# Patient Record
Sex: Female | Born: 1985 | Race: Black or African American | Hispanic: No | Marital: Single | Smoking: Former smoker
Health system: Southern US, Community
[De-identification: ages and names within clinical notes are randomized; demographics above are authoritative.]

## PROBLEM LIST (undated history)

## (undated) ENCOUNTER — Inpatient Hospital Stay (HOSPITAL_COMMUNITY): Payer: Self-pay

## (undated) DIAGNOSIS — B999 Unspecified infectious disease: Secondary | ICD-10-CM

## (undated) DIAGNOSIS — F329 Major depressive disorder, single episode, unspecified: Secondary | ICD-10-CM

## (undated) DIAGNOSIS — Z975 Presence of (intrauterine) contraceptive device: Secondary | ICD-10-CM

## (undated) DIAGNOSIS — Z8659 Personal history of other mental and behavioral disorders: Secondary | ICD-10-CM

## (undated) DIAGNOSIS — F32A Depression, unspecified: Secondary | ICD-10-CM

## (undated) DIAGNOSIS — Z8719 Personal history of other diseases of the digestive system: Secondary | ICD-10-CM

## (undated) DIAGNOSIS — Z8619 Personal history of other infectious and parasitic diseases: Secondary | ICD-10-CM

## (undated) DIAGNOSIS — D649 Anemia, unspecified: Secondary | ICD-10-CM

## (undated) DIAGNOSIS — Z87898 Personal history of other specified conditions: Secondary | ICD-10-CM

## (undated) HISTORY — DX: Major depressive disorder, single episode, unspecified: F32.9

## (undated) HISTORY — DX: Presence of (intrauterine) contraceptive device: Z97.5

## (undated) HISTORY — DX: Anemia, unspecified: D64.9

## (undated) HISTORY — DX: Personal history of other diseases of the digestive system: Z87.19

## (undated) HISTORY — DX: Personal history of other infectious and parasitic diseases: Z86.19

## (undated) HISTORY — DX: Personal history of other mental and behavioral disorders: Z86.59

## (undated) HISTORY — PX: TONSILLECTOMY: SUR1361

## (undated) HISTORY — PX: FOOT SURGERY: SHX648

## (undated) HISTORY — DX: Personal history of other specified conditions: Z87.898

## (undated) HISTORY — DX: Depression, unspecified: F32.A

---

## 2011-06-27 ENCOUNTER — Inpatient Hospital Stay (HOSPITAL_COMMUNITY): Admission: AD | Admit: 2011-06-27 | Payer: Self-pay | Source: Ambulatory Visit | Admitting: Obstetrics and Gynecology

## 2012-01-13 LAB — OB RESULTS CONSOLE ANTIBODY SCREEN: Antibody Screen: NEGATIVE

## 2012-01-13 LAB — OB RESULTS CONSOLE HEPATITIS B SURFACE ANTIGEN: Hepatitis B Surface Ag: NEGATIVE

## 2012-01-13 LAB — OB RESULTS CONSOLE ABO/RH: RH Type: POSITIVE

## 2012-01-13 LAB — OB RESULTS CONSOLE GC/CHLAMYDIA: Gonorrhea: NEGATIVE

## 2012-03-16 ENCOUNTER — Inpatient Hospital Stay (HOSPITAL_COMMUNITY)
Admission: AD | Admit: 2012-03-16 | Discharge: 2012-03-16 | Disposition: A | Discharge status: Home / Self Care | Payer: BC Managed Care – PPO | Source: Ambulatory Visit | Attending: Obstetrics and Gynecology | Admitting: Obstetrics and Gynecology

## 2012-03-16 ENCOUNTER — Encounter (HOSPITAL_COMMUNITY): Payer: Self-pay

## 2012-03-16 DIAGNOSIS — O2390 Unspecified genitourinary tract infection in pregnancy, unspecified trimester: Secondary | ICD-10-CM

## 2012-03-16 DIAGNOSIS — N390 Urinary tract infection, site not specified: Secondary | ICD-10-CM | POA: Insufficient documentation

## 2012-03-16 DIAGNOSIS — O2340 Unspecified infection of urinary tract in pregnancy, unspecified trimester: Secondary | ICD-10-CM

## 2012-03-16 DIAGNOSIS — R109 Unspecified abdominal pain: Secondary | ICD-10-CM | POA: Insufficient documentation

## 2012-03-16 LAB — URINE MICROSCOPIC-ADD ON

## 2012-03-16 LAB — URINALYSIS, ROUTINE W REFLEX MICROSCOPIC
Bilirubin Urine: NEGATIVE
Glucose, UA: NEGATIVE mg/dL
Protein, ur: NEGATIVE mg/dL
Urobilinogen, UA: 0.2 mg/dL (ref 0.0–1.0)

## 2012-03-16 MED ORDER — NITROFURANTOIN MONOHYD MACRO 100 MG PO CAPS: 100.0000 mg | ORAL_CAPSULE | Freq: Two times a day (BID) | ORAL | Status: DC

## 2012-03-16 NOTE — MAU Provider Note (Signed)
History     CSN: [id number]  Arrival date and time: 03/16/12 1440   None     Chief Complaint  Patient presents with  . Abdominal Pain   HPILouise Gibbs is 26 y.o. [id number] [redacted]w[redacted]d weeks presenting with soreness and abdominal dull ache on the right.  Feels is more with standing and pushing carts at work.  Patient of Dr. Vance Gather.  Denies vaginal bleeding, discharge, fever,chills.nausea and vomiting.  Last intercourse 2 days ago.     Past Medical History  Diagnosis Date  . No pertinent past medical history     No past surgical history on file.  No family history on file.  History  Substance Use Topics  . Smoking status: Former Games developer  . Smokeless tobacco: Not on file  . Alcohol Use: No    Allergies: No Known Allergies  Prescriptions prior to admission  Medication Sig Dispense Refill  . Prenatal Vit-Fe Fumarate-FA (PRENATAL MULTIVITAMIN) TABS Take 1 tablet by mouth daily.        Review of Systems  Constitutional: Negative.   Gastrointestinal: Positive for abdominal pain. Negative for nausea and vomiting.  Genitourinary:       Lower lateral pain >right   Physical Exam   Blood pressure 109/57, pulse 91, temperature 98.3 F (36.8 C), temperature source Oral, resp. rate 18, height 5\' 3"  (1.6 m), weight 100.245 kg (221 lb).  Physical Exam  Constitutional: She is oriented to person, place, and time. She appears well-developed and well-nourished. No distress.  HENT:  Head: Normocephalic.  GI: Soft. There is no tenderness.  Genitourinary:       Cervical exam by Elnita Maxwell, RN --Cervix is closed, long and thick  Neurological: She is alert and oriented to person, place, and time.  Skin: Skin is warm and dry.  Psychiatric: She has a normal mood and affect. Her behavior is normal.   Results for orders placed during the hospital encounter of 03/16/12 (from the past 24 hour(s))  URINALYSIS, ROUTINE W REFLEX MICROSCOPIC     Status: Abnormal   Collection Time   03/16/12  2:45 PM       Component Value Range   Color, Urine YELLOW  YELLOW   APPearance CLOUDY (*) CLEAR   Specific Gravity, Urine >1.030 (*) 1.005 - 1.030   pH 6.0  5.0 - 8.0   Glucose, UA NEGATIVE  NEGATIVE mg/dL   Hgb urine dipstick SMALL (*) NEGATIVE   Bilirubin Urine NEGATIVE  NEGATIVE   Ketones, ur 40 (*) NEGATIVE mg/dL   Protein, ur NEGATIVE  NEGATIVE mg/dL   Urobilinogen, UA 0.2  0.0 - 1.0 mg/dL   Nitrite NEGATIVE  NEGATIVE   Leukocytes, UA SMALL (*) NEGATIVE  URINE MICROSCOPIC-ADD ON     Status: Abnormal   Collection Time   03/16/12  2:45 PM      Component Value Range   Squamous Epithelial / LPF MANY (*) RARE   WBC, UA 3-6  <3 WBC/hpf   RBC / HPF 0-2  <3 RBC/hpf   Bacteria, UA MANY (*) RARE   MAU Course  Procedures  Urine culture pending  MDM 15:30  Reported MSE to Dr. Henderson Cloud.  Order given for Urine culture, check cervix.   Treat with Macrobid 500mg  bid X 5 days and push po fluids.  Keep scheduled appt/call if sxs worsen.  Assessment and Plan  A;  Urinary Tract infection at [redacted]w[redacted]d gestation     Possible Round ligament pain  P Rx for Macrobid 500mg  bid X  5 days     Instructed to push po fluids, call office if sxs worsen    Keep scheduled appointment.    Tylenol prn for discomfort  KEY,EVE M 03/16/2012, 3:08 PM

## 2012-03-16 NOTE — MAU Note (Signed)
Patient presents with complaint of dull pain on left side of abdomen, with cramping in lower abdomen.

## 2012-05-10 ENCOUNTER — Inpatient Hospital Stay (HOSPITAL_COMMUNITY)
Admission: AD | Admit: 2012-05-10 | Discharge: 2012-05-10 | Disposition: A | Discharge status: Home / Self Care | Payer: BC Managed Care – PPO | Source: Ambulatory Visit | Attending: Obstetrics and Gynecology | Admitting: Obstetrics and Gynecology

## 2012-05-10 ENCOUNTER — Encounter (HOSPITAL_COMMUNITY): Payer: Self-pay | Admitting: *Deleted

## 2012-05-10 DIAGNOSIS — O99891 Other specified diseases and conditions complicating pregnancy: Secondary | ICD-10-CM | POA: Insufficient documentation

## 2012-05-10 DIAGNOSIS — A088 Other specified intestinal infections: Secondary | ICD-10-CM

## 2012-05-10 DIAGNOSIS — R197 Diarrhea, unspecified: Secondary | ICD-10-CM | POA: Insufficient documentation

## 2012-05-10 DIAGNOSIS — R109 Unspecified abdominal pain: Secondary | ICD-10-CM | POA: Insufficient documentation

## 2012-05-10 DIAGNOSIS — O212 Late vomiting of pregnancy: Secondary | ICD-10-CM | POA: Insufficient documentation

## 2012-05-10 DIAGNOSIS — A084 Viral intestinal infection, unspecified: Secondary | ICD-10-CM

## 2012-05-10 LAB — URINALYSIS, ROUTINE W REFLEX MICROSCOPIC
Ketones, ur: 15 mg/dL — AB
Leukocytes, UA: NEGATIVE
Nitrite: NEGATIVE
Protein, ur: NEGATIVE mg/dL
pH: 6 (ref 5.0–8.0)

## 2012-05-10 LAB — URINE MICROSCOPIC-ADD ON

## 2012-05-10 MED ORDER — ONDANSETRON 4 MG PO TBDP: 4.0000 mg | ORAL_TABLET | Freq: Three times a day (TID) | ORAL | Status: DC | PRN

## 2012-05-10 MED ORDER — LACTATED RINGERS IV BOLUS (SEPSIS)
1000.0000 mL | Freq: Once | INTRAVENOUS | Status: AC
  Administered 2012-05-10: 1000 mL via INTRAVENOUS

## 2012-05-10 MED ORDER — ONDANSETRON 8 MG/NS 50 ML IVPB
8.0000 mg | Freq: Once | INTRAVENOUS | Status: AC
  Administered 2012-05-10: 8 mg via INTRAVENOUS
  Filled 2012-05-10: qty 8

## 2012-05-10 NOTE — MAU Note (Signed)
Pt's son has been sick, pt started vomitting yesterday, severe abd pain started @ 0430, vomitting has continued, now also having diarrhea.  Denies vag bleeding.

## 2012-05-10 NOTE — MAU Provider Note (Signed)
  History     CSN: [id number]  Arrival date and time: 05/10/12 1508   First Provider Initiated Contact with Patient 05/10/12 1630      Chief Complaint  Patient presents with  . Emesis  . Diarrhea  . Abdominal Pain   HPI  Mary Gibbs is a 26 y.o. [id number] who presents today with nausea/vomiting. She states her son had a 24-48 hour "bug" a few days ago, and she started with nausea and vomiting yesterday morning, and diarrhea today as well. She states she has felt cramping as well.   Past Medical History  Diagnosis Date  . No pertinent past medical history     History reviewed. No pertinent past surgical history.  History reviewed. No pertinent family history.  History  Substance Use Topics  . Smoking status: Former Games developer  . Smokeless tobacco: Not on file  . Alcohol Use: No    Allergies: No Known Allergies  Prescriptions prior to admission  Medication Sig Dispense Refill  . nitrofurantoin, macrocrystal-monohydrate, (MACROBID) 100 MG capsule Take 1 capsule (100 mg total) by mouth 2 (two) times daily.  10 capsule  0  . Prenatal Vit-Fe Fumarate-FA (PRENATAL MULTIVITAMIN) TABS Take 1 tablet by mouth daily.        Review of Systems  Constitutional: Negative for fever and chills.  Eyes: Negative for blurred vision.  Gastrointestinal: Positive for nausea, vomiting, abdominal pain and diarrhea. Negative for constipation.  Genitourinary: Negative for dysuria, urgency and frequency.  Neurological: Negative for dizziness and headaches.   Physical Exam   Blood pressure 102/57, pulse 93, temperature 99 F (37.2 C), temperature source Oral, resp. rate 18, height 5\' 4"  (1.626 m), weight 101.878 kg (224 lb 9.6 oz).  Physical Exam  Nursing note and vitals reviewed. Constitutional: She is oriented to person, place, and time. She appears well-developed and well-nourished. No distress.  Cardiovascular: Normal rate.   Respiratory: Effort normal.  GI: Soft. She exhibits no  distension. There is no tenderness. There is no rebound.  Neurological: She is alert and oriented to person, place, and time.  Skin: Skin is warm and dry.  Psychiatric: She has a normal mood and affect.   FHT: 15 mins of Cat I tracing Toco: no UC's MAU Course  Procedures  1705: Spoke with Dr. Marcelle Overlie. Pt is OK for DC home. It is ok that we cannot obtain a full 20 min NST. Provide pt with rx for zofran  Assessment and Plan   1. Viral gastroenteritis     Naziyah, Tieszen  Home Medication Instructions JYN:[id number]   Printed on:05/10/12 1710  Medication Information                    Prenatal Vit-Fe Fumarate-FA (PRENATAL MULTIVITAMIN) TABS Take 1 tablet by mouth daily.           nitrofurantoin, macrocrystal-monohydrate, (MACROBID) 100 MG capsule Take 1 capsule (100 mg total) by mouth 2 (two) times daily.           ondansetron (ZOFRAN ODT) 4 MG disintegrating tablet Take 1 tablet (4 mg total) by mouth every 8 (eight) hours as needed for nausea.            Tawnya Crook 05/10/2012, 4:30 PM

## 2012-05-12 LAB — URINE CULTURE

## 2012-05-24 ENCOUNTER — Ambulatory Visit (INDEPENDENT_AMBULATORY_CARE_PROVIDER_SITE_OTHER): Payer: Medicaid Other | Admitting: Obstetrics and Gynecology

## 2012-05-24 DIAGNOSIS — Z331 Pregnant state, incidental: Secondary | ICD-10-CM

## 2012-05-24 NOTE — Progress Notes (Signed)
NOB interview completed.  Pt transferred care from [facility], but has not been seen there since 03/2012.  Pt completed ROI form for records.  No complaints, doing well.  NOB work up scheduled for Monday 06/14/2012 w/ AVS.

## 2012-06-02 NOTE — L&D Delivery Note (Signed)
Delivery Note At 7:40 AM a viable female, "Mary Gibbs", was delivered via Vaginal, Spontaneous Delivery (Presentation: ; Occiput Posterior).  APGAR: 9, 9; weight .   Placenta status: Intact, partial manual removal.  Placenta was anterior, delivered partially with gentle traction at 30 min after delivery--approx 1/2 of placenta had extruded out the introitus.  The rest was assisted to deliver by gentle twisting and single manual exploration just inside the cervix to facilitate complete expulsion of the rest of the placenta. Bleeding WNL, but cytotech 1000 mcg placed in rectum after placental delivery for prophylaxis.  Uterus well-contracted after placental delivery, no residual fragments noted on gentle intrauterine exploration.   Cord: 3 vessels with the following complications: Cord around body.  Cord pH: NA  Anesthesia: Epidural  Episiotomy: None Lacerations: 2nd degree;Perineal Suture Repair: 3.0 monocryl Est. Blood Loss (mL): 300  Mom to postpartum.  Baby to skin to skin.. Methergine po course will be started on MBU.  Nigel Bridgeman 08/26/2012, 9:11 AM

## 2012-06-14 ENCOUNTER — Ambulatory Visit (INDEPENDENT_AMBULATORY_CARE_PROVIDER_SITE_OTHER): Payer: Medicaid Other | Admitting: Obstetrics and Gynecology

## 2012-06-14 ENCOUNTER — Encounter: Payer: Self-pay | Admitting: Obstetrics and Gynecology

## 2012-06-14 VITALS — BP 110/64 | Wt 232.0 lb

## 2012-06-14 DIAGNOSIS — Z331 Pregnant state, incidental: Secondary | ICD-10-CM

## 2012-06-14 DIAGNOSIS — O4410 Placenta previa with hemorrhage, unspecified trimester: Secondary | ICD-10-CM

## 2012-06-14 DIAGNOSIS — Z3490 Encounter for supervision of normal pregnancy, unspecified, unspecified trimester: Secondary | ICD-10-CM | POA: Insufficient documentation

## 2012-06-14 DIAGNOSIS — E669 Obesity, unspecified: Secondary | ICD-10-CM | POA: Insufficient documentation

## 2012-06-14 DIAGNOSIS — O4400 Placenta previa specified as without hemorrhage, unspecified trimester: Secondary | ICD-10-CM | POA: Insufficient documentation

## 2012-06-14 DIAGNOSIS — R319 Hematuria, unspecified: Secondary | ICD-10-CM

## 2012-06-14 LAB — POCT URINALYSIS DIPSTICK
Nitrite, UA: NEGATIVE
Urobilinogen, UA: NEGATIVE
pH, UA: 5

## 2012-06-14 NOTE — Progress Notes (Signed)
[redacted]w[redacted]d LMP 11/15/2011 Last Pap Smear 07/2011

## 2012-06-14 NOTE — Progress Notes (Signed)
[facility] NEW OB EXAMINATION   Mary Gibbs is a 27 y.o. female, G3P1011, who presents at [redacted]w[redacted]d gestation for a new obstetrical examination. The patient was followed by another office here in [place] for the beginning of her pregnancy.  She states that her last menstrual period was normal.  An ultrasound was performed at 18 weeks that confirmed the due date of August 21, 2012.  The patient has not been seen in the other office, however, is October 2013.  She says that she was told that she had a placenta previa.  She denies bleeding.  The following portions of the patient's history were reviewed and updated as appropriate: allergies, current medications, past family history, past medical history, past social history, past surgical history and problem list.  OB History    Grav Para Term Preterm Abortions TAB SAB Ect Mult Living   3 1 1  1 1    1       Past Medical History  Diagnosis Date  . No pertinent past medical history   . Depression     Zoloft in the past  . Anemia     @ last [facility] appt    Past Surgical History  Procedure Date  . Tonsillectomy     As a baby    Family History  Problem Relation Age of Onset  . Epilepsy Sister   . Colon cancer Paternal Grandfather   . Breast cancer Maternal Aunt     x 3;after menopause  . Hypertension Mother   . Tuberculosis Mother     as a child  . Diabetes Maternal Grandmother   . Diabetes Mother     diet controlled  . Depression Mother   . Bipolar disorder Sister     Social History:  reports that she quit smoking about 7 months ago. Her smoking use included Cigarettes. She smoked .3 packs per day. She has never used smokeless tobacco. She reports that she drinks alcohol. She reports that she does not use illicit drugs.  Allergies: No Known Allergies  Medications: prenatal vitamins   Objective:    BP 110/64  Wt 232 lb (105.235 kg)    Weight:  Wt Readings from Last 1 Encounters:  06/14/12 232 lb (105.235 kg)          BMI:  There is no height on file to calculate BMI.  General Appearance: Alert, appropriate appearance for age. No acute distress HEENT: Grossly normal Neck / Thyroid: Supple, no masses, nodes or enlargement Lungs: clear to auscultation bilaterally Back: No CVA tenderness Breast Exam: previously performed Cardiovascular: Regular rate and rhythm. S1, S2, no murmur Gastrointestinal: Soft, non-tender, no masses or organomegaly.                               Fundal height: 30 weeks weeks                               Fetal heart tones audible: yes  ++++++++++++++++++++++++++++++++++++++++++++++++++++++++  Pelvic Exam: External genitalia: normal general appearance Vaginal: normal without tenderness, induration or masses and relaxation: No Cervix: and uterus exam: Deferred until we can confirm there is not a placenta previa  ++++++++++++++++++++++++++++++++++++++++++++++++++++++++  Lymphatic Exam: Non-palpable nodes in neck, clavicular, axillary, or inguinal regions Neurologic: Normal speech, no tremor  Psychiatric: Alert and oriented, appropriate affect.  Prenatal labs: ABO, Rh:   Antibody:   Rubella:  RPR:    HBsAg:    HIV:    GBS:      Assessment:   27 y.o. female G3P1011 at [redacted]w[redacted]d gestation ( EDC is August 21, 2012) by: Normal Last menstrual period: yes Ultrasound:                               confirms                                Inconsistent prenatal care  Obesity  Placenta previa  Past history of depression   Plan:    Ultrasound to evaluate fetus and placenta.  Glucola next visit.  We discussed routine pregnancy issues:  Toxoplasmosis was reviewed.  The patient was told to avoid cat liter boxes and feces.  The patient was told to avoid predator fish including tuna because of our concerns for mercury consumption.  The patient was told to avoid soft cheeses.  The patient was told to be sure that all lunch meats are well cooked.  Genetic screening was  discussed. 2 late  Our model for pregnancy management was reviewed.  Proper diet and exercise reviewed.  Return to office in 2 weeks.  Medications include:  Prenatal vitamins  Mylinda Latina.D.

## 2012-06-23 ENCOUNTER — Ambulatory Visit: Payer: Medicaid Other | Admitting: Obstetrics and Gynecology

## 2012-06-23 ENCOUNTER — Encounter: Payer: Self-pay | Admitting: Obstetrics and Gynecology

## 2012-06-23 ENCOUNTER — Ambulatory Visit: Payer: Medicaid Other

## 2012-06-23 ENCOUNTER — Other Ambulatory Visit: Payer: Self-pay

## 2012-06-23 VITALS — BP 102/62 | Wt 230.0 lb

## 2012-06-23 DIAGNOSIS — O4400 Placenta previa specified as without hemorrhage, unspecified trimester: Secondary | ICD-10-CM

## 2012-06-23 DIAGNOSIS — IMO0002 Reserved for concepts with insufficient information to code with codable children: Secondary | ICD-10-CM

## 2012-06-23 DIAGNOSIS — Z3689 Encounter for other specified antenatal screening: Secondary | ICD-10-CM

## 2012-06-23 DIAGNOSIS — Z1389 Encounter for screening for other disorder: Secondary | ICD-10-CM

## 2012-06-23 DIAGNOSIS — Z331 Pregnant state, incidental: Secondary | ICD-10-CM

## 2012-06-23 LAB — CBC
HCT: 31.4 % — ABNORMAL LOW (ref 36.0–46.0)
MCHC: 34.1 g/dL (ref 30.0–36.0)
Platelets: 286 10*3/uL (ref 150–400)
RDW: 13.9 % (ref 11.5–15.5)

## 2012-06-23 NOTE — Progress Notes (Signed)
[redacted]w[redacted]d GTT  Pt has bilateral hip pain x 1 week now

## 2012-06-23 NOTE — Progress Notes (Signed)
Ultrasound shows:  SIUP  S=D     [place] EDD: *08/24/2012           EFW: 3 lbs 11 oz 51%           AFI: 15.8           Cervical length: 2.45 cm           Placenta localization: right lateral           Fetal presentation: vertex U/S Comments: AC measurement is 1 week less than GA.  Suggest f/u in 2-3 weeks to assess for developing IUGR No late abnormality is seen. This study is technically limited due to fetal position and advanced GA. Female Gender.  CX closed. Normal Adnexa Pt declines flu vaccine

## 2012-06-23 NOTE — Progress Notes (Signed)
[redacted]w[redacted]d  GFM Previa resolved. AGA with mild AC lag. Normal AFI. Repeat ultrasound at [place] to evaluate [facility] growth Glucola today. Blood type: A +

## 2012-06-23 NOTE — Addendum Note (Signed)
Addended by: Darien Ramus on: 06/23/2012 05:31 PM   Modules accepted: Orders

## 2012-06-23 NOTE — Addendum Note (Signed)
Addended by: Darien Ramus on: 06/23/2012 05:30 PM   Modules accepted: Orders

## 2012-06-24 LAB — RPR

## 2012-06-24 LAB — US OB COMP + 14 WK

## 2012-06-24 LAB — GLUCOSE TOLERANCE, 1 HOUR (50G) W/O FASTING: Glucose, 1 Hour GTT: 127 mg/dL (ref 70–140)

## 2012-07-05 ENCOUNTER — Inpatient Hospital Stay (HOSPITAL_COMMUNITY)
Admission: AD | Admit: 2012-07-05 | Discharge: 2012-07-05 | Disposition: A | Discharge status: Home / Self Care | Payer: Medicaid Other | Source: Ambulatory Visit | Attending: Obstetrics and Gynecology | Admitting: Obstetrics and Gynecology

## 2012-07-05 ENCOUNTER — Encounter (HOSPITAL_COMMUNITY): Payer: Self-pay | Admitting: *Deleted

## 2012-07-05 DIAGNOSIS — O4700 False labor before 37 completed weeks of gestation, unspecified trimester: Secondary | ICD-10-CM | POA: Insufficient documentation

## 2012-07-05 LAB — URINE MICROSCOPIC-ADD ON

## 2012-07-05 LAB — URINALYSIS, ROUTINE W REFLEX MICROSCOPIC
Protein, ur: 30 mg/dL — AB
Urobilinogen, UA: 0.2 mg/dL (ref 0.0–1.0)

## 2012-07-05 LAB — FETAL FIBRONECTIN: Fetal Fibronectin: NEGATIVE

## 2012-07-05 NOTE — MAU Note (Signed)
Mary Gibbs is a 27 y.o. female presenting for contractions.  Reports contractions every minute and painful that suddenly started at 10:30 this am. Mainly felt mid and upper abdomen. Lasted 15-20 minutes. Came directly to MAU unannounced.Denies vaginal bleeding and LOF. Reports normal BM ( this am) and no urinary symptoms. Reports GFM. No history of PTL with first pregnancy.  OB History    Grav Para Term Preterm Abortions TAB SAB Ect Mult Living   3 1 1  1 1    1      Past Medical History  Diagnosis Date  . No pertinent past medical history   . Depression     Zoloft in the past  . Anemia     @ last [facility] appt   Past Surgical History  Procedure Date  . Tonsillectomy     As a baby      Blood pressure 104/64, pulse 95, temperature 98.4 F (36.9 C), temperature source Oral, height 5\' 3"  (1.6 m), weight 234 lb 3.2 oz (106.232 kg), SpO2 99.00%.  General Appearance: Alert, appropriate appearance for age. No acute distress Psychiatric Exam: Alert and oriented, appropriate affect  Fetal monitoring: Baseline: 13-140 bpm, Variability: Good {> 6 bpm), Accelerations: Reactive, Decelerations: Absent and reviewed and reassuring          No contractions since here.  ++++++++++++++++++++++++++++++++++++++++++++++++++++++++++++++++  Vaginal exam: Cervix is long and closed. Vertex -2 FFN: negative U/A: 15 ketones  ++++++++++++++++++++++++++++++++++++++++++++++++++++++++++++++++   Assessment and plan:  SIUP at 33+2 weeks without evidence of PTL D/C home and encourage to push po fluids Keep appointment in office 07/07/12  Silverio Lay MD

## 2012-07-05 NOTE — MAU Note (Signed)
Patient states she had been having contractions every minute but they stopped on the way to the hospital. Denies leaking or bleeding. Reports feeling less movement than usual.

## 2012-07-06 LAB — URINE CULTURE

## 2012-07-07 ENCOUNTER — Encounter: Payer: Self-pay | Admitting: Obstetrics and Gynecology

## 2012-07-07 ENCOUNTER — Ambulatory Visit: Payer: Medicaid Other

## 2012-07-07 ENCOUNTER — Ambulatory Visit: Payer: Medicaid Other | Admitting: Obstetrics and Gynecology

## 2012-07-07 VITALS — BP 110/60 | Wt 232.0 lb

## 2012-07-07 DIAGNOSIS — IMO0002 Reserved for concepts with insufficient information to code with codable children: Secondary | ICD-10-CM

## 2012-07-07 DIAGNOSIS — Z331 Pregnant state, incidental: Secondary | ICD-10-CM

## 2012-07-07 NOTE — Progress Notes (Signed)
[redacted]w[redacted]d [place] s=d cx 2.83 cm AFI 14 EFW 4-14 42% Results for orders placed during the hospital encounter of 07/05/12  URINALYSIS, ROUTINE W REFLEX MICROSCOPIC      Component Value Range   Color, Urine YELLOW  YELLOW   APPearance CLEAR  CLEAR   Specific Gravity, Urine 1.025  1.005 - 1.030   pH 7.5  5.0 - 8.0   Glucose, UA NEGATIVE  NEGATIVE mg/dL   Hgb urine dipstick TRACE (*) NEGATIVE   Bilirubin Urine NEGATIVE  NEGATIVE   Ketones, ur 15 (*) NEGATIVE mg/dL   Protein, ur 30 (*) NEGATIVE mg/dL   Urobilinogen, UA 0.2  0.0 - 1.0 mg/dL   Nitrite NEGATIVE  NEGATIVE   Leukocytes, UA TRACE (*) NEGATIVE  FETAL FIBRONECTIN      Component Value Range   Fetal Fibronectin NEGATIVE  NEGATIVE  URINE MICROSCOPIC-ADD ON      Component Value Range   Squamous Epithelial / LPF MANY (*) RARE   WBC, UA 7-10  <3 WBC/hpf   RBC / HPF 7-10  <3 RBC/hpf   Bacteria, UA MANY (*) RARE   Urine-Other MUCOUS PRESENT    URINE CULTURE      Component Value Range   Specimen Description URINE, CLEAN CATCH     Special Requests NONE     Culture  Setup Time 07/05/2012 19:27     Colony Count 40,000 COLONIES/ML     Culture       Value: Multiple bacterial morphotypes present, none predominant. Suggest appropriate recollection if clinically indicated.   Report Status 07/06/2012 FINAL     cx is dynamic.  Preterm precautions given

## 2012-07-07 NOTE — Patient Instructions (Signed)

## 2012-07-08 LAB — US OB FOLLOW UP

## 2012-07-21 ENCOUNTER — Ambulatory Visit: Payer: Medicaid Other | Admitting: Certified Nurse Midwife

## 2012-07-21 VITALS — BP 102/58 | Wt 237.0 lb

## 2012-07-21 DIAGNOSIS — Z331 Pregnant state, incidental: Secondary | ICD-10-CM

## 2012-07-21 NOTE — Progress Notes (Signed)
Pt stated no issues today.  

## 2012-07-21 NOTE — Patient Instructions (Addendum)
Normal Labor and Delivery Your caregiver must first be sure you are in labor. Signs of labor include:  You may pass what is called "the mucus plug" before labor begins. This is a small amount of blood stained mucus.  Regular uterine contractions.  The time between contractions get closer together.  The discomfort and pain gradually gets more intense.  Pains are mostly located in the back.  Pains get worse when walking.  The cervix (the opening of the uterus becomes thinner (begins to efface) and opens up (dilates). Once you are in labor and admitted into the hospital or care center, your caregiver will do the following:  A complete physical examination.  Check your vital signs (blood pressure, pulse, temperature and the fetal heart rate).  Do a vaginal examination (using a sterile glove and lubricant) to determine:  The position (presentation) of the baby (head [vertex] or buttock first).  The level (station) of the baby's head in the birth canal.  The effacement and dilatation of the cervix.  You may have your pubic hair shaved and be given an enema depending on your caregiver and the circumstance.  An electronic monitor is usually placed on your abdomen. The monitor follows the length and intensity of the contractions, as well as the baby's heart rate.  Usually, your caregiver will insert an IV in your arm with a bottle of sugar water. This is done as a precaution so that medications can be given to you quickly during labor or delivery. NORMAL LABOR AND DELIVERY IS DIVIDED UP INTO 3 STAGES: First Stage This is when regular contractions begin and the cervix begins to efface and dilate. This stage can last from 3 to 15 hours. The end of the first stage is when the cervix is 100% effaced and 10 centimeters dilated. Pain medications may be given by   Injection (morphine, demerol, etc.)  Regional anesthesia (spinal, caudal or epidural, anesthetics given in different locations of  the spine). Paracervical pain medication may be given, which is an injection of and anesthetic on each side of the cervix. A pregnant woman may request to have "Natural Childbirth" which is not to have any medications or anesthesia during her labor and delivery. Second Stage This is when the baby comes down through the birth canal (vagina) and is born. This can take 1 to 4 hours. As the baby's head comes down through the birth canal, you may feel like you are going to have a bowel movement. You will get the urge to bear down and push until the baby is delivered. As the baby's head is being delivered, the caregiver will decide if an episiotomy (a cut in the perineum and vagina area) is needed to prevent tearing of the tissue in this area. The episiotomy is sewn up after the delivery of the baby and placenta. Sometimes a mask with nitrous oxide is given for the mother to breath during the delivery of the baby to help if there is too much pain. The end of Stage 2 is when the baby is fully delivered. Then when the umbilical cord stops pulsating it is clamped and cut. Third Stage The third stage begins after the baby is completely delivered and ends after the placenta (afterbirth) is delivered. This usually takes 5 to 30 minutes. After the placenta is delivered, a medication is given either by intravenous or injection to help contract the uterus and prevent bleeding. The third stage is not painful and pain medication is usually not necessary.  If an episiotomy was done, it is repaired at this time. After the delivery, the mother is watched and monitored closely for 1 to 2 hours to make sure there is no postpartum bleeding (hemorrhage). If there is a lot of bleeding, medication is given to contract the uterus and stop the bleeding. Document Released: 02/26/2008 Document Revised: 08/11/2011 Document Reviewed: 02/26/2008 [facility] Patient Information 2013 [facility] [place], [place]. Preventing Preterm Labor Preterm labor is  when a pregnant woman has contractions that cause the cervix to open, shorten, and thin before 37 weeks of pregnancy. You will have regular contractions (tightening) 2 to 3 minutes apart. This usually causes discomfort or pain. HOME CARE  Eat a healthy diet.  Take your vitamins as told by your doctor.  Drink enough fluids to keep your pee (urine) clear or pale yellow every day.  Get rest and sleep.  Do not have sex if you are at high risk for preterm labor.  Follow your doctor's advice about activity, medicines, and tests.  Avoid stress.  Avoid hard labor or exercise that lasts for a long time.  Do not smoke. GET HELP RIGHT AWAY IF:   You are having contractions.  You have belly (abdominal) pain.  You have bleeding from your vagina.  You have pain when you pee (urinate).  You have abnormal discharge from your vagina.  You have a temperature by mouth above 102 F (38.9 C). MAKE SURE YOU:  Understand these instructions.  Will watch your condition.  Will get help if you are not doing well or get worse. Document Released: 08/15/2008 Document Revised: 08/11/2011 Document Reviewed: 08/15/2008 [facility] Patient Information 2013 [place], [place].

## 2012-07-21 NOTE — Progress Notes (Signed)
[redacted]w[redacted]d Denies UCs, cramping, or spotting GFM Questions answered re: s/s of labor Discussed s/s of preterm labor Pt instructions given: preventing preterm labor and normal labor and delivery   GBS collected today

## 2012-07-23 LAB — STREP B DNA PROBE: GBSP: NEGATIVE

## 2012-07-28 ENCOUNTER — Ambulatory Visit: Payer: Medicaid Other | Admitting: Obstetrics and Gynecology

## 2012-07-28 VITALS — BP 102/64 | Wt 237.0 lb

## 2012-07-28 DIAGNOSIS — Z3490 Encounter for supervision of normal pregnancy, unspecified, unspecified trimester: Secondary | ICD-10-CM

## 2012-07-28 DIAGNOSIS — E669 Obesity, unspecified: Secondary | ICD-10-CM

## 2012-07-28 NOTE — Progress Notes (Signed)
Pt stated no issues today. Checklist completed

## 2012-08-04 ENCOUNTER — Encounter: Payer: Medicaid Other | Admitting: Certified Nurse Midwife

## 2012-08-10 ENCOUNTER — Encounter: Payer: Self-pay | Admitting: Family Medicine

## 2012-08-10 ENCOUNTER — Ambulatory Visit: Payer: Medicaid Other | Admitting: Family Medicine

## 2012-08-10 ENCOUNTER — Telehealth: Payer: Self-pay | Admitting: Obstetrics and Gynecology

## 2012-08-10 VITALS — BP 110/60 | Wt 234.0 lb

## 2012-08-10 DIAGNOSIS — Z3689 Encounter for other specified antenatal screening: Secondary | ICD-10-CM

## 2012-08-10 DIAGNOSIS — Z331 Pregnant state, incidental: Secondary | ICD-10-CM

## 2012-08-10 NOTE — Telephone Encounter (Signed)
Spoke with pt rgd msg. Pt stated she has been having contractions 5-10 mins app. + fm,+fluids -edema. Per LC ok to bring pt in for nst and app with LC to follow @ 11:15 am. Pts voice understanding.

## 2012-08-10 NOTE — Progress Notes (Signed)
Reactive NST 

## 2012-08-10 NOTE — Progress Notes (Signed)
[redacted]w[redacted]d S: Doing well with good fetal movement.  Walked for 1 hour at home and contractions came closer together then rested with a decrease in contractions.   Denies VB, unsure LOF.  V/D occurred with the contractions, but able to drink and snack without vomiting earlier. O: Sitting on exam table in NAD, no contractions.   A/P: Nitrazine Paper negative.  NST: reactive (FHT 135, 15 x 15, moderate variability, no contractions).  Reassurance given normal prodromal labor.  Continue to drink liquids, call office with decreased FM or if pains worsens. ROB in 1 week. L.Ashleyanne Hemmingway, FNP-BC

## 2012-08-10 NOTE — Progress Notes (Signed)
[redacted]w[redacted]d Work in pt c/o ctx's every 5 -10 mins since 230 am. Ctx's have now subsided. NST R today  Pt also c/o diarrhea and vomiting yesterday around 2200.   Requests cx check

## 2012-08-12 ENCOUNTER — Encounter: Payer: Medicaid Other | Admitting: Family Medicine

## 2012-08-18 ENCOUNTER — Ambulatory Visit: Payer: Medicaid Other | Admitting: Family Medicine

## 2012-08-18 VITALS — BP 100/62 | Wt 239.0 lb

## 2012-08-18 DIAGNOSIS — Z331 Pregnant state, incidental: Secondary | ICD-10-CM

## 2012-08-18 NOTE — Progress Notes (Signed)
[redacted]w[redacted]d Doing well, good fetal movement.  No contractions. Discussed induction if no delivery over the weekend. ROB in 1 week with NST for postdates. L.Zamyra Allensworth, FNP-BC

## 2012-08-18 NOTE — Progress Notes (Signed)
[redacted]w[redacted]d No complaints today.

## 2012-08-19 ENCOUNTER — Telehealth: Payer: Self-pay | Admitting: Obstetrics and Gynecology

## 2012-08-19 NOTE — Telephone Encounter (Signed)
Induction scheduled for 08/28/12 @ 7:30pm with ND/SL. -Adrianne Pridgen

## 2012-08-20 ENCOUNTER — Telehealth (HOSPITAL_COMMUNITY): Payer: Self-pay | Admitting: *Deleted

## 2012-08-20 ENCOUNTER — Encounter (HOSPITAL_COMMUNITY): Payer: Self-pay | Admitting: *Deleted

## 2012-08-20 NOTE — Telephone Encounter (Signed)
Preadmission screen  

## 2012-08-26 ENCOUNTER — Encounter (HOSPITAL_COMMUNITY): Payer: Self-pay | Admitting: Anesthesiology

## 2012-08-26 ENCOUNTER — Inpatient Hospital Stay (HOSPITAL_COMMUNITY)
Admission: AD | Admit: 2012-08-26 | Discharge: 2012-08-27 | DRG: 774 | Disposition: A | Discharge status: Home / Self Care | Payer: Medicaid Other | Source: Ambulatory Visit | Attending: Obstetrics and Gynecology | Admitting: Obstetrics and Gynecology

## 2012-08-26 ENCOUNTER — Inpatient Hospital Stay (HOSPITAL_COMMUNITY): Payer: Medicaid Other | Admitting: Anesthesiology

## 2012-08-26 ENCOUNTER — Encounter (HOSPITAL_COMMUNITY): Payer: Self-pay | Admitting: *Deleted

## 2012-08-26 DIAGNOSIS — O4290 Premature rupture of membranes, unspecified as to length of time between rupture and onset of labor, unspecified weeks of gestation: Principal | ICD-10-CM | POA: Diagnosis present

## 2012-08-26 DIAGNOSIS — D649 Anemia, unspecified: Secondary | ICD-10-CM | POA: Diagnosis not present

## 2012-08-26 DIAGNOSIS — O9903 Anemia complicating the puerperium: Secondary | ICD-10-CM | POA: Diagnosis not present

## 2012-08-26 LAB — CBC
MCH: 29.3 pg (ref 26.0–34.0)
MCHC: 33.2 g/dL (ref 30.0–36.0)
MCV: 88.2 fL (ref 78.0–100.0)
Platelets: 294 10*3/uL (ref 150–400)
RDW: 14 % (ref 11.5–15.5)

## 2012-08-26 LAB — RPR: RPR Ser Ql: NONREACTIVE

## 2012-08-26 MED ORDER — MISOPROSTOL 200 MCG PO TABS: 1000.0000 ug | ORAL_TABLET | Freq: Once | ORAL | Status: AC

## 2012-08-26 MED ORDER — LACTATED RINGERS IV SOLN
INTRAVENOUS | Status: DC
  Administered 2012-08-26: 02:00:00 via INTRAVENOUS

## 2012-08-26 MED ORDER — OXYTOCIN BOLUS FROM INFUSION
500.0000 mL | INTRAVENOUS | Status: DC
  Administered 2012-08-26: 500 mL via INTRAVENOUS

## 2012-08-26 MED ORDER — PRENATAL MULTIVITAMIN CH
1.0000 | ORAL_TABLET | Freq: Every day | ORAL | Status: DC
  Filled 2012-08-26: qty 1

## 2012-08-26 MED ORDER — BENZOCAINE-MENTHOL 20-0.5 % EX AERO
1.0000 "application " | INHALATION_SPRAY | CUTANEOUS | Status: DC | PRN
  Administered 2012-08-26: 1 via TOPICAL
  Filled 2012-08-26: qty 56

## 2012-08-26 MED ORDER — OXYTOCIN 40 UNITS IN LACTATED RINGERS INFUSION - SIMPLE MED
62.5000 mL/h | INTRAVENOUS | Status: DC
  Filled 2012-08-26: qty 1000

## 2012-08-26 MED ORDER — DIPHENHYDRAMINE HCL 25 MG PO CAPS: 25.0000 mg | ORAL_CAPSULE | Freq: Four times a day (QID) | ORAL | Status: DC | PRN

## 2012-08-26 MED ORDER — IBUPROFEN 600 MG PO TABS
600.0000 mg | ORAL_TABLET | Freq: Four times a day (QID) | ORAL | Status: DC | PRN
  Filled 2012-08-26: qty 1

## 2012-08-26 MED ORDER — FENTANYL CITRATE 0.05 MG/ML IJ SOLN
100.0000 ug | INTRAMUSCULAR | Status: DC | PRN
  Administered 2012-08-26 (×3): 100 ug via INTRAVENOUS
  Filled 2012-08-26 (×4): qty 2

## 2012-08-26 MED ORDER — SODIUM CHLORIDE 0.9 % IJ SOLN: 3.0000 mL | INTRAMUSCULAR | Status: DC | PRN

## 2012-08-26 MED ORDER — DIBUCAINE 1 % RE OINT: 1.0000 "application " | TOPICAL_OINTMENT | RECTAL | Status: DC | PRN

## 2012-08-26 MED ORDER — FENTANYL CITRATE 0.05 MG/ML IJ SOLN
100.0000 ug | Freq: Once | INTRAMUSCULAR | Status: AC
  Administered 2012-08-26: 100 ug via INTRAVENOUS

## 2012-08-26 MED ORDER — MISOPROSTOL 200 MCG PO TABS
50.0000 ug | ORAL_TABLET | ORAL | Status: DC
  Filled 2012-08-26: qty 0.5

## 2012-08-26 MED ORDER — LACTATED RINGERS IV SOLN
500.0000 mL | INTRAVENOUS | Status: DC | PRN
  Administered 2012-08-26: 500 mL via INTRAVENOUS

## 2012-08-26 MED ORDER — IBUPROFEN 600 MG PO TABS
600.0000 mg | ORAL_TABLET | Freq: Four times a day (QID) | ORAL | Status: DC
  Administered 2012-08-26 – 2012-08-27 (×5): 600 mg via ORAL
  Filled 2012-08-26 (×5): qty 1

## 2012-08-26 MED ORDER — LACTATED RINGERS IV SOLN
500.0000 mL | Freq: Once | INTRAVENOUS | Status: AC
  Administered 2012-08-26: 500 mL via INTRAVENOUS

## 2012-08-26 MED ORDER — FENTANYL 2.5 MCG/ML BUPIVACAINE 1/10 % EPIDURAL INFUSION (WH - ANES)
INTRAMUSCULAR | Status: DC | PRN
  Administered 2012-08-26: 13 mL/h via EPIDURAL

## 2012-08-26 MED ORDER — FENTANYL 2.5 MCG/ML BUPIVACAINE 1/10 % EPIDURAL INFUSION (WH - ANES): 13.0000 mL/h | INTRAMUSCULAR | Status: DC | PRN

## 2012-08-26 MED ORDER — SODIUM CHLORIDE 0.9 % IJ SOLN: 3.0000 mL | Freq: Two times a day (BID) | INTRAMUSCULAR | Status: DC

## 2012-08-26 MED ORDER — METHYLERGONOVINE MALEATE 0.2 MG PO TABS
0.2000 mg | ORAL_TABLET | ORAL | Status: AC
  Administered 2012-08-26 – 2012-08-27 (×6): 0.2 mg via ORAL
  Filled 2012-08-26 (×6): qty 1

## 2012-08-26 MED ORDER — PHENYLEPHRINE 40 MCG/ML (10ML) SYRINGE FOR IV PUSH (FOR BLOOD PRESSURE SUPPORT)
80.0000 ug | PREFILLED_SYRINGE | INTRAVENOUS | Status: DC | PRN
  Filled 2012-08-26: qty 5
  Filled 2012-08-26: qty 2

## 2012-08-26 MED ORDER — SODIUM CHLORIDE 0.9 % IV SOLN: 250.0000 mL | INTRAVENOUS | Status: DC | PRN

## 2012-08-26 MED ORDER — OXYCODONE-ACETAMINOPHEN 5-325 MG PO TABS
1.0000 | ORAL_TABLET | ORAL | Status: DC | PRN
  Administered 2012-08-26: 2 via ORAL
  Administered 2012-08-26: 1 via ORAL
  Administered 2012-08-26: 2 via ORAL
  Administered 2012-08-27 (×2): 1 via ORAL
  Filled 2012-08-26 (×3): qty 1
  Filled 2012-08-26 (×2): qty 2

## 2012-08-26 MED ORDER — TERBUTALINE SULFATE 1 MG/ML IJ SOLN: 0.2500 mg | Freq: Once | INTRAMUSCULAR | Status: DC | PRN

## 2012-08-26 MED ORDER — OXYTOCIN 40 UNITS IN LACTATED RINGERS INFUSION - SIMPLE MED: 1.0000 m[IU]/min | INTRAVENOUS | Status: DC

## 2012-08-26 MED ORDER — LANOLIN HYDROUS EX OINT: TOPICAL_OINTMENT | CUTANEOUS | Status: DC | PRN

## 2012-08-26 MED ORDER — TETANUS-DIPHTH-ACELL PERTUSSIS 5-2.5-18.5 LF-MCG/0.5 IM SUSP
0.5000 mL | Freq: Once | INTRAMUSCULAR | Status: AC
  Administered 2012-08-27: 0.5 mL via INTRAMUSCULAR
  Filled 2012-08-26: qty 0.5

## 2012-08-26 MED ORDER — ONDANSETRON HCL 4 MG/2ML IJ SOLN: 4.0000 mg | Freq: Four times a day (QID) | INTRAMUSCULAR | Status: DC | PRN

## 2012-08-26 MED ORDER — SIMETHICONE 80 MG PO CHEW: 80.0000 mg | CHEWABLE_TABLET | ORAL | Status: DC | PRN

## 2012-08-26 MED ORDER — ZOLPIDEM TARTRATE 5 MG PO TABS: 5.0000 mg | ORAL_TABLET | Freq: Every evening | ORAL | Status: DC | PRN

## 2012-08-26 MED ORDER — MISOPROSTOL 200 MCG PO TABS
50.0000 ug | ORAL_TABLET | ORAL | Status: DC
  Administered 2012-08-26: 50 ug via ORAL

## 2012-08-26 MED ORDER — CITRIC ACID-SODIUM CITRATE 334-500 MG/5ML PO SOLN: 30.0000 mL | ORAL | Status: DC | PRN

## 2012-08-26 MED ORDER — OXYCODONE-ACETAMINOPHEN 5-325 MG PO TABS: 1.0000 | ORAL_TABLET | ORAL | Status: DC | PRN

## 2012-08-26 MED ORDER — LIDOCAINE HCL (PF) 1 % IJ SOLN
30.0000 mL | INTRAMUSCULAR | Status: DC | PRN
  Administered 2012-08-26: 30 mL via SUBCUTANEOUS
  Filled 2012-08-26 (×2): qty 30

## 2012-08-26 MED ORDER — EPHEDRINE 5 MG/ML INJ
10.0000 mg | INTRAVENOUS | Status: DC | PRN
  Filled 2012-08-26: qty 4
  Filled 2012-08-26: qty 2

## 2012-08-26 MED ORDER — ONDANSETRON HCL 4 MG/2ML IJ SOLN: 4.0000 mg | INTRAMUSCULAR | Status: DC | PRN

## 2012-08-26 MED ORDER — DIPHENHYDRAMINE HCL 50 MG/ML IJ SOLN: 12.5000 mg | INTRAMUSCULAR | Status: DC | PRN

## 2012-08-26 MED ORDER — LIDOCAINE HCL (PF) 1 % IJ SOLN
INTRAMUSCULAR | Status: DC | PRN
  Administered 2012-08-26 (×2): 4 mL

## 2012-08-26 MED ORDER — PHENYLEPHRINE 40 MCG/ML (10ML) SYRINGE FOR IV PUSH (FOR BLOOD PRESSURE SUPPORT)
80.0000 ug | PREFILLED_SYRINGE | INTRAVENOUS | Status: DC | PRN
  Administered 2012-08-26: 80 ug via INTRAVENOUS
  Filled 2012-08-26: qty 2

## 2012-08-26 MED ORDER — ONDANSETRON HCL 4 MG PO TABS: 4.0000 mg | ORAL_TABLET | ORAL | Status: DC | PRN

## 2012-08-26 MED ORDER — WITCH HAZEL-GLYCERIN EX PADS: 1.0000 "application " | MEDICATED_PAD | CUTANEOUS | Status: DC | PRN

## 2012-08-26 MED ORDER — SENNOSIDES-DOCUSATE SODIUM 8.6-50 MG PO TABS
2.0000 | ORAL_TABLET | Freq: Every day | ORAL | Status: DC
  Administered 2012-08-26: 2 via ORAL

## 2012-08-26 MED ORDER — ACETAMINOPHEN 325 MG PO TABS: 650.0000 mg | ORAL_TABLET | ORAL | Status: DC | PRN

## 2012-08-26 MED ORDER — FENTANYL 2.5 MCG/ML BUPIVACAINE 1/10 % EPIDURAL INFUSION (WH - ANES)
14.0000 mL/h | INTRAMUSCULAR | Status: DC | PRN
  Filled 2012-08-26: qty 125

## 2012-08-26 MED ORDER — EPHEDRINE 5 MG/ML INJ
10.0000 mg | INTRAVENOUS | Status: DC | PRN
  Filled 2012-08-26: qty 2

## 2012-08-26 MED ORDER — MISOPROSTOL 200 MCG PO TABS
ORAL_TABLET | ORAL | Status: AC
  Administered 2012-08-26: 1000 ug via VAGINAL
  Filled 2012-08-26: qty 5

## 2012-08-26 NOTE — Progress Notes (Signed)
Steelman CNM gave orders at bedside that pt can have saline lock, and can be monitored intermittently per protocol after a reactive strip of one hour is obtained.

## 2012-08-26 NOTE — H&P (Signed)
Mary Gibbs is a 27 y.o.black female presenting unannounced at [redacted]w[redacted]d  W/ CC of SROM at 2330.  Fluid has been clear.  Pt denies any contractions or VB.  No fever or chills.  No resp or GI c/o's.  GFM.  Cx at last exam=0.5cm per pt recall.  No UTI or PIH s/s.  She is accompanied by her s.o., "Vonna Kotyk."  Prenatal Course: Pt transferred care from [facility] at 27 weeks secondary to change in insurance.  This switch to Medicaid to lead to pt having a lapse in care from Oct, until Jan.  She has had an uncomplicated pregnancy otherwise.  She had an anatomy scan at [facility] which was WNL except a low-lying, ?placenta previa was seen.  F/u u/s at [redacted]w[redacted]d at [facility] showed normal placentation; mild AC lag noted, but serial u/s showed wnl.  Her dates were confirmed by an early u/s on 01/07/12, giving EDC of 08/21/12.  Pt had a normal 1hr gtt.  She had AFP drawn at previous office.  Normal pregnancy discomforts and wt gain.    OB Hx: G1= SVD '06 Female=7lb at 39 weeks s/p PROM; induced w/ Pitocin; delivery in [place] G2=TAB '07 at 6 weeks; no complications G3=current  Maternal Medical History:  Reason for admission: Rupture of membranes.   Contractions: Frequency: rare.    Fetal activity: Perceived fetal activity is normal.   Last perceived fetal movement was within the past hour.      OB History   Grav Para Term Preterm Abortions TAB SAB Ect Mult Living   3 1 1  1 1    1      Past Medical History  Diagnosis Date  . No pertinent past medical history   . Depression     Zoloft in the past  . Anemia     @ last [facility] appt  . Hx of varicella   . H/O hiatal hernia   . Hx of ulcer disease    Past Surgical History  Procedure Laterality Date  . Tonsillectomy      As a baby   Family History: family history includes Bipolar disorder in her sister; Breast cancer in her maternal aunt; Cancer in her maternal aunts and paternal grandfather; Colon cancer in her paternal grandfather; Depression in her mother and  sister; Diabetes in her maternal grandmother and mother; Epilepsy in her sister; Hypertension in her maternal grandmother and mother; and Tuberculosis in her mother. Social History:  reports that she quit smoking about 9 months ago. Her smoking use included Cigarettes. She smoked 0.30 packs per day. She has never used smokeless tobacco. She reports that  drinks alcohol. She reports that she does not use illicit drugs.   Prenatal Transfer Tool  Maternal Diabetes: No Genetic Screening: Normal Maternal Ultrasounds/Referrals: Normal Fetal Ultrasounds or other Referrals:  None Maternal Substance Abuse:  No Significant Maternal Medications:  None Significant Maternal Lab Results:  Lab values include: Group B Strep negative Other Comments:  AFP only drawn when pt at [facility] per [facility] record  Review of Systems  Constitutional: Negative.   HENT: Negative.   Eyes: Negative.   Respiratory: Negative.   Cardiovascular: Negative.   Gastrointestinal: Negative.   Genitourinary: Negative.   Skin: Negative.   Neurological: Negative.     Dilation: 1.5 Effacement (%): 70 Exam by:: H.Pedrohenrique Mcconville,CNM Blood pressure 118/64, pulse 92, temperature 97.5 F (36.4 C), temperature source Oral, resp. rate 20, height 5\' 3"  (1.6 m), weight 239 lb (108.41  kg), SpO2 100.00%. Maternal Exam:  Uterine Assessment: Contraction strength is mild.  Contraction frequency is rare.   Abdomen: Patient reports no abdominal tenderness. Fetal presentation: vertex  Introitus: Ferning test: positive.  Nitrazine test: not done. Amniotic fluid character: clear.  Pelvis: adequate for delivery.   Cervix: Cervix evaluated by sterile speculum exam and digital exam.     Fetal Exam Fetal Monitor Review: Mode: ultrasound.   Baseline rate: 140.  Variability: moderate (6-25 bpm).   Pattern: accelerations present and no decelerations.    Fetal State Assessment: Category I - tracings are normal.     Physical  Exam  Constitutional: She is oriented to person, place, and time. She appears well-developed and well-nourished. No distress.  HENT:  Head: Normocephalic and atraumatic.  Eyes: Pupils are equal, round, and reactive to light.  Cardiovascular: Normal rate.   Respiratory: Effort normal.  GI: Soft.  Gravid  Genitourinary:  SSE: neg pooling; but clear fluid seen coming out introitus  Musculoskeletal: She exhibits edema.  Mild BLE non-pitting generalized edema  Neurological: She is alert and oriented to person, place, and time. She has normal reflexes.  Skin: Skin is warm and dry.    Prenatal labs: ABO, Rh: A/Positive/-- (08/13 0000) Antibody: Negative (08/13 0000) Rubella: Immune (08/13 0000) RPR: NON REAC (01/22 1750)  HBsAg: Negative (08/13 0000)  HIV: Non-reactive (08/13 0000)  GBS: NEGATIVE (02/19 0920)  1hr gtt=127  Assessment/Plan: 1. [redacted]w[redacted]d 2. PROM 08/25/12 at 2330 3. GBS neg 4. Cat I FHT  1. Admit to [facility] w/ Dr. Stefano Gaul as attending 2. Routine L&D orders 3. Pt given option of expectant management vs augmentation of labor.  Given options or po cytotec or Pitocin; r/b/a rev'd.  Pt desires to be mobile and avoid epidural and when PROM happened w/ first child, she didn't like the Pitocin restricting her b/c she had to stay on continuous monitoring in [place], and desires to remain as mobile as possible this time.   Pt agreeable to start w/ po cytotec and transition to Pitocin prn. 4. Minimize cervical exams 5. Support as needed 6. C/w MD prn   Tangala Wiegert H 08/26/2012, 1:25 AM

## 2012-08-26 NOTE — Anesthesia Preprocedure Evaluation (Signed)
Anesthesia Evaluation  Patient identified by MRN, date of birth, ID band Patient awake    Reviewed: Allergy & Precautions, H&P , NPO status , Patient's Chart, lab work & pertinent test results  Airway Mallampati: III TM Distance: >3 FB Neck ROM: Full    Dental no notable dental hx. (+) Teeth Intact   Pulmonary former smoker,  breath sounds clear to auscultation  Pulmonary exam normal       Cardiovascular negative cardio ROS  Rhythm:Regular Rate:Normal     Neuro/Psych PSYCHIATRIC DISORDERS Depression    GI/Hepatic Neg liver ROS, hiatal hernia, GERD-  ,  Endo/Other  Morbid obesity  Renal/GU negative Renal ROS  negative genitourinary   Musculoskeletal negative musculoskeletal ROS (+)   Abdominal (+) + obese,   Peds  Hematology negative hematology ROS (+)   Anesthesia Other Findings   Reproductive/Obstetrics Placenta previa                           Anesthesia Physical Anesthesia Plan  ASA: III  Anesthesia Plan: Spinal   Post-op Pain Management:    Induction:   Airway Management Planned: Natural Airway  Additional Equipment:   Intra-op Plan:   Post-operative Plan:   Informed Consent: I have reviewed the patients History and Physical, chart, labs and discussed the procedure including the risks, benefits and alternatives for the proposed anesthesia with the patient or authorized representative who has indicated his/her understanding and acceptance.     Plan Discussed with: CRNA, Anesthesiologist and Surgeon  Anesthesia Plan Comments:         Anesthesia Quick Evaluation

## 2012-08-26 NOTE — Progress Notes (Signed)
Subjective: Pt is in position to receive epidural, just waiting for anesthesiologist to arrive in room to start procedure.  Received IV Fentanyl at 0444, with little benefit, and just received an add'l dose while awaiting anesthesia.  Pt is breathing w/ ctxs.  S.o., "Vonna Kotyk" sitting at bs.    Objective: BP 110/55  Pulse 82  Temp(Src) 97.9 F (36.6 C) (Oral)  Resp 20  Ht 5\' 3"  (1.6 m)  Wt 239 lb (108.41 kg)  BMI 42.35 kg/m2  SpO2 98%      FHT:  FHR: 130 bpm, variability: moderate,  accelerations:  Present,  decelerations:  Absent UC:   regular, every 2-3 minutes SVE:   Dilation: 2 Effacement (%): 80 Station: -1 Exam by:: L Lamon RN Deferred recheck at present Labs: Lab Results  Component Value Date   WBC 10.7* 08/26/2012   HGB 11.2* 08/26/2012   HCT 33.7* 08/26/2012   MCV 88.2 08/26/2012   PLT 294 08/26/2012    Assessment / Plan: 1. [redacted]w[redacted]d 2. latent labor s/p 1 dose of cytotec po for PROM 3. GBS neg  Labor: Progressing normally Preeclampsia:  no signs or symptoms of toxicity Fetal Wellbeing:  Category I Pain Control:  Fentanyl I/D:  n/a Anticipated MOD:  NSVD 1. Will defer add'l augmentation at this time until pt receives epidural and comfortable. 2. Will allow on-coming CNM to recheck pt's cx and make continued plan.  Pattijo Juste H 08/26/2012, 6:32 AM

## 2012-08-26 NOTE — Anesthesia Procedure Notes (Signed)
Epidural Patient location during procedure: OB Start time: 08/26/2012 6:51 AM  Staffing Anesthesiologist: Terin Cragle A. Performed by: anesthesiologist   Preanesthetic Checklist Completed: patient identified, site marked, surgical consent, pre-op evaluation, timeout performed, IV checked, risks and benefits discussed and monitors and equipment checked  Epidural Patient position: sitting Prep: site prepped and draped and DuraPrep Patient monitoring: continuous pulse ox and blood pressure Approach: midline Injection technique: LOR air  Needle:  Needle type: Tuohy  Needle gauge: 17 G Needle length: 9 cm and 9 Needle insertion depth: 7 cm Catheter type: closed end flexible Catheter size: 19 Gauge Catheter at skin depth: 12 cm Test dose: negative and Other  Assessment Events: blood not aspirated, injection not painful, no injection resistance, negative IV test and no paresthesia  Additional Notes Patient identified. Risks and benefits discussed including failed block, incomplete  Pain control, post dural puncture headache, nerve damage, paralysis, blood pressure Changes, nausea, vomiting, reactions to medications-both toxic and allergic and post Partum back pain. All questions were answered. Patient expressed understanding and wished to proceed. Sterile technique was used throughout procedure. Epidural site was Dressed with sterile barrier dressing. No paresthesias, signs of intravascular injection Or signs of intrathecal spread were encountered.  Patient was more comfortable after the epidural was dosed. Please see RN's note for documentation of vital signs and FHR which are stable.

## 2012-08-26 NOTE — Progress Notes (Signed)
Steelman CNM gave orders to give IV fentanyl; SVE deferred.

## 2012-08-26 NOTE — Progress Notes (Signed)
Subjective: Breathing w/ ctxs on my arrival to room.  S.o. Asleep at bs.  LOF remains clear.  No VB.  Declines need for pain interventions at this time.   Objective: BP 107/56  Pulse 82  Temp(Src) 97.9 F (36.6 C) (Oral)  Resp 20  Ht 5\' 3"  (1.6 m)  Wt 239 lb (108.41 kg)  BMI 42.35 kg/m2  SpO2 100%      FHT:  FHR: 140 bpm, variability: moderate,  accelerations:  Present,  decelerations:  Absent UC:   irregular, every 2-6 minutes SVE:   Deferred exam at present Labs: Lab Results  Component Value Date   WBC 10.7* 08/26/2012   HGB 11.2* 08/26/2012   HCT 33.7* 08/26/2012   MCV 88.2 08/26/2012   PLT 294 08/26/2012    Assessment / Plan: 1. [redacted]w[redacted]d 2. PROM 3. augmentation after admission w/ cytotec po 4. GBS neg  Labor: Progressing normally Preeclampsia:  no signs or symptoms of toxicity Fetal Wellbeing:  Category I Pain Control:  Labor support without medications I/D:  n/a Anticipated MOD:  NSVD 1. Continue current POC and c/w MD prn.  Support as needed.    Graden Hoshino H 08/26/2012, 3:43 AM

## 2012-08-26 NOTE — Anesthesia Postprocedure Evaluation (Signed)
  Anesthesia Post-op Note  Patient: Mary Gibbs  Procedure(s) Performed: * No procedures listed *  Patient Location: PACU and Mother/Baby  Anesthesia Type:Epidural  Level of Consciousness: awake, alert , oriented and patient cooperative  Airway and Oxygen Therapy: Patient Spontanous Breathing  Post-op Pain: mild  Post-op Assessment: Post-op Vital signs reviewed, No signs of Nausea or vomiting, Adequate PO intake, Pain level controlled, No headache, No residual numbness and No residual motor weakness  Post-op Vital Signs: Reviewed and stable  Complications: No apparent anesthesia complications

## 2012-08-26 NOTE — Progress Notes (Signed)
UR completed 

## 2012-08-26 NOTE — MAU Note (Signed)
Pt states she was leaking clear fluid at 2330

## 2012-08-27 LAB — CBC
Platelets: 241 10*3/uL (ref 150–400)
RDW: 14.1 % (ref 11.5–15.5)
WBC: 11.6 10*3/uL — ABNORMAL HIGH (ref 4.0–10.5)

## 2012-08-27 MED ORDER — OXYCODONE-ACETAMINOPHEN 5-325 MG PO TABS: 1.0000 | ORAL_TABLET | ORAL | Status: DC | PRN

## 2012-08-27 MED ORDER — IBUPROFEN 600 MG PO TABS: 600.0000 mg | ORAL_TABLET | Freq: Four times a day (QID) | ORAL | Status: DC

## 2012-08-27 NOTE — Progress Notes (Signed)
Patient was referred for history of depression/anxiety.  * Referral screened out by Clinical Social Worker because none of the following criteria appear to apply:  ~ History of anxiety/depression during this pregnancy, or of post-partum depression.  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  ~ History of depression due to pregnancy loss/loss of child  OR  * Patient's symptoms currently being treated with medication and/or therapy.  Please contact the Clinical Social Worker if needs arise, or by the patient's request.

## 2012-08-27 NOTE — Discharge Summary (Signed)
Vaginal Delivery Discharge Summary  Mary Gibbs  DOB:    12-Mar-1986 MRN:    [id number] CSN:    [id number]  Date of admission:                  08/26/12  Date of discharge:                   08/27/12  Procedures this admission:  SVD with 2nd degree lac  Newborn Data:  Live born female  Birth Weight: 7 lb 14.1 oz (3575 g) APGAR: 9, 9  Home with mother. Name: "Mary Gibbs"  History of Present Illness:  [name] is a 27 y.o. female, [id number], who presents at [redacted]w[redacted]d weeks gestation. The patient has been followed at the [facility]. She was admitted rupture of membranes. Her pregnancy has been complicated by:   Patient Active Problem List  Diagnosis  . Obesity  . NSVD (normal spontaneous vaginal delivery)  . Perineal laceration with delivery, second degree   Hospital course:  The patient was admitted for PROM.   Her labor was not complicated. She proceeded to have a vaginal delivery of a healthy infant. Her delivery was not complicated, however there was difficulty with the placenta and 1000mg  of Cytotec was administered as PPH prophylaxis.. Her postpartum course was not complicated. PP CBC indicated anemia without hemodynamic instability.  She was discharged to home on postpartum day 1 doing well.  Feeding:  breast  Contraception:  IUD  Discharge hemoglobin:  Hemoglobin  Date Value Range Status  08/27/2012 9.9* 12.0 - 15.0 g/dL Final     HCT  Date Value Range Status  08/27/2012 30.3* 36.0 - 46.0 % Final    Discharge Physical Exam:   General: alert and no distress Lochia: appropriate Uterine Fundus: firm Incision: healing well DVT Evaluation: No evidence of DVT seen on physical exam. Negative Homan's sign.  Intrapartum Procedures: spontaneous vaginal delivery Postpartum Procedures: none Complications-Operative and Postpartum: 2nd degree perineal laceration  Discharge Diagnoses: Term  Pregnancy-delivered  Discharge Information:  Activity:           per [facility] handout Diet:                routine Medications: Ibuprofen, Iron and Percocet Condition:      stable Instructions:  refer to practice specific booklet Discharge to: home  Follow-up Information   Follow up with [facility]. Schedule an appointment as soon as possible for a visit in 5 weeks. (Call with any questions or concerns)    Contact information:   [address] [place] [address] [phone]       Haroldine Laws CNM, MSN 08/27/2012

## 2012-08-28 ENCOUNTER — Inpatient Hospital Stay (HOSPITAL_COMMUNITY): Admission: RE | Admit: 2012-08-28 | Payer: Medicaid Other | Source: Ambulatory Visit

## 2014-04-03 ENCOUNTER — Encounter (HOSPITAL_COMMUNITY): Payer: Self-pay | Admitting: *Deleted

## 2014-07-13 ENCOUNTER — Emergency Department (HOSPITAL_COMMUNITY): Payer: PRIVATE HEALTH INSURANCE

## 2014-07-13 ENCOUNTER — Encounter (HOSPITAL_COMMUNITY): Payer: Self-pay | Admitting: Emergency Medicine

## 2014-07-13 ENCOUNTER — Emergency Department (HOSPITAL_COMMUNITY)
Admission: EM | Admit: 2014-07-13 | Discharge: 2014-07-14 | Disposition: A | Discharge status: Home / Self Care | Payer: PRIVATE HEALTH INSURANCE | Attending: Emergency Medicine | Admitting: Emergency Medicine

## 2014-07-13 DIAGNOSIS — Z8659 Personal history of other mental and behavioral disorders: Secondary | ICD-10-CM | POA: Insufficient documentation

## 2014-07-13 DIAGNOSIS — R112 Nausea with vomiting, unspecified: Secondary | ICD-10-CM | POA: Insufficient documentation

## 2014-07-13 DIAGNOSIS — Z862 Personal history of diseases of the blood and blood-forming organs and certain disorders involving the immune mechanism: Secondary | ICD-10-CM | POA: Insufficient documentation

## 2014-07-13 DIAGNOSIS — Z87891 Personal history of nicotine dependence: Secondary | ICD-10-CM | POA: Insufficient documentation

## 2014-07-13 DIAGNOSIS — Z8619 Personal history of other infectious and parasitic diseases: Secondary | ICD-10-CM | POA: Diagnosis not present

## 2014-07-13 DIAGNOSIS — R1010 Upper abdominal pain, unspecified: Secondary | ICD-10-CM | POA: Diagnosis present

## 2014-07-13 DIAGNOSIS — Z8719 Personal history of other diseases of the digestive system: Secondary | ICD-10-CM | POA: Insufficient documentation

## 2014-07-13 DIAGNOSIS — R1013 Epigastric pain: Secondary | ICD-10-CM | POA: Diagnosis not present

## 2014-07-13 DIAGNOSIS — Z3202 Encounter for pregnancy test, result negative: Secondary | ICD-10-CM | POA: Insufficient documentation

## 2014-07-13 DIAGNOSIS — R1032 Left lower quadrant pain: Secondary | ICD-10-CM | POA: Diagnosis not present

## 2014-07-13 LAB — URINALYSIS, ROUTINE W REFLEX MICROSCOPIC
Bilirubin Urine: NEGATIVE
GLUCOSE, UA: NEGATIVE mg/dL
Hgb urine dipstick: NEGATIVE
Ketones, ur: NEGATIVE mg/dL
Leukocytes, UA: NEGATIVE
Nitrite: NEGATIVE
PH: 6 (ref 5.0–8.0)
Protein, ur: NEGATIVE mg/dL
SPECIFIC GRAVITY, URINE: 1.025 (ref 1.005–1.030)
UROBILINOGEN UA: 0.2 mg/dL (ref 0.0–1.0)

## 2014-07-13 LAB — CBC WITH DIFFERENTIAL/PLATELET
BASOS ABS: 0 10*3/uL (ref 0.0–0.1)
Basophils Relative: 0 % (ref 0–1)
EOS PCT: 1 % (ref 0–5)
Eosinophils Absolute: 0.1 10*3/uL (ref 0.0–0.7)
HCT: 40.3 % (ref 36.0–46.0)
Hemoglobin: 13.1 g/dL (ref 12.0–15.0)
LYMPHS PCT: 9 % — AB (ref 12–46)
Lymphs Abs: 1.3 10*3/uL (ref 0.7–4.0)
MCH: 30.3 pg (ref 26.0–34.0)
MCHC: 32.5 g/dL (ref 30.0–36.0)
MCV: 93.3 fL (ref 78.0–100.0)
Monocytes Absolute: 0.3 10*3/uL (ref 0.1–1.0)
Monocytes Relative: 2 % — ABNORMAL LOW (ref 3–12)
Neutro Abs: 13.6 10*3/uL — ABNORMAL HIGH (ref 1.7–7.7)
Neutrophils Relative %: 88 % — ABNORMAL HIGH (ref 43–77)
Platelets: 364 10*3/uL (ref 150–400)
RBC: 4.32 MIL/uL (ref 3.87–5.11)
RDW: 12.8 % (ref 11.5–15.5)
WBC: 15.4 10*3/uL — ABNORMAL HIGH (ref 4.0–10.5)

## 2014-07-13 LAB — COMPREHENSIVE METABOLIC PANEL
ALT: 19 U/L (ref 0–35)
AST: 26 U/L (ref 0–37)
Albumin: 4.1 g/dL (ref 3.5–5.2)
Alkaline Phosphatase: 54 U/L (ref 39–117)
Anion gap: 9 (ref 5–15)
BUN: 13 mg/dL (ref 6–23)
CO2: 24 mmol/L (ref 19–32)
CREATININE: 0.64 mg/dL (ref 0.50–1.10)
Calcium: 8.9 mg/dL (ref 8.4–10.5)
Chloride: 104 mmol/L (ref 96–112)
GFR calc Af Amer: >90 mL/min (ref >90)
Glucose, Bld: 136 mg/dL — ABNORMAL HIGH (ref 70–99)
Potassium: 4.2 mmol/L (ref 3.5–5.1)
SODIUM: 137 mmol/L (ref 135–145)
Total Bilirubin: 0.8 mg/dL (ref 0.3–1.2)
Total Protein: 8 g/dL (ref 6.0–8.3)

## 2014-07-13 LAB — POC URINE PREG, ED: PREG TEST UR: NEGATIVE

## 2014-07-13 MED ORDER — MORPHINE SULFATE 4 MG/ML IJ SOLN
4.0000 mg | Freq: Once | INTRAMUSCULAR | Status: AC
  Administered 2014-07-13: 4 mg via INTRAVENOUS
  Filled 2014-07-13: qty 1

## 2014-07-13 MED ORDER — IOHEXOL 300 MG/ML  SOLN
100.0000 mL | Freq: Once | INTRAMUSCULAR | Status: AC | PRN
  Administered 2014-07-13: 100 mL via INTRAVENOUS

## 2014-07-13 MED ORDER — ONDANSETRON HCL 4 MG/2ML IJ SOLN
4.0000 mg | Freq: Once | INTRAMUSCULAR | Status: AC
  Administered 2014-07-13: 4 mg via INTRAVENOUS
  Filled 2014-07-13: qty 2

## 2014-07-13 MED ORDER — IOHEXOL 300 MG/ML  SOLN
50.0000 mL | Freq: Once | INTRAMUSCULAR | Status: AC | PRN
Start: 2014-07-13 — End: 2014-07-13
  Administered 2014-07-13: 50 mL via ORAL

## 2014-07-13 MED ORDER — GI COCKTAIL ~~LOC~~
30.0000 mL | Freq: Once | ORAL | Status: AC
  Administered 2014-07-13: 30 mL via ORAL
  Filled 2014-07-13: qty 30

## 2014-07-13 NOTE — ED Provider Notes (Signed)
CSN: [id number]     Arrival date & time 07/13/14  2116 History   First MD Initiated Contact with Patient 07/13/14 2229     Chief Complaint  Patient presents with  . Abdominal Pain     (Consider location/radiation/quality/duration/timing/severity/associated sxs/prior Treatment) HPI   Patient with history of peptic ulcer disease and hiatal hernia who presents complaining of upper abdominal pain. Patient reports gradual onset of upper abdominal pain which she described as a sharp and stabbing sensation, waxing and waning, 9 out of 10 has been ongoing for most of the day. Symptoms getting progressively worse. States she feels nauseous and had had 8 bouts of nonbloody nonbilious vomiting without any diarrhea. She has decrease in appetite. The pain is worsening with movement and palpation. Pain is also described as a squeezing sensation. Report no specific treatment tried. Patient felt similar to an episode in the past when she was diagnosed with ulcer disease. She denies any fever, chills, chest pain, shortness of breath, back pain, dysuria, hematuria, vaginal bleeding, vaginal discharge, or hematochezia, or melena. Denies any history of diabetes, or alcohol abuse.  Past Medical History  Diagnosis Date  . No pertinent past medical history   . Depression     Zoloft in the past  . Anemia     @ last [facility] appt  . Hx of varicella   . H/O hiatal hernia   . Hx of ulcer disease   . NSVD (normal spontaneous vaginal delivery) 08/26/2012   Past Surgical History  Procedure Laterality Date  . Tonsillectomy      As a baby   Family History  Problem Relation Age of Onset  . Epilepsy Sister   . Colon cancer Paternal Grandfather   . Cancer Paternal Grandfather     prostate  . Breast cancer Maternal Aunt     x 3;after menopause  . Cancer Maternal Aunt     breast  . Hypertension Mother   . Tuberculosis Mother     as a child  . Diabetes Mother     diet controlled  . Depression Mother   . Diabetes  Maternal Grandmother   . Hypertension Maternal Grandmother   . Bipolar disorder Sister   . Depression Sister   . Cancer Maternal Aunt     breast   History  Substance Use Topics  . Smoking status: Former Smoker -- 0.30 packs/day    Types: Cigarettes    Quit date: 11/01/2011  . Smokeless tobacco: Never Used  . Alcohol Use: No   OB History    Gravida Para Term Preterm AB TAB SAB Ectopic Multiple Living   Review of Systems  All other systems reviewed and are negative.     Allergies  Review of patient's allergies indicates no known allergies.  Home Medications   Prior to Admission medications   Medication Sig Start Date End Date Taking? Authorizing Provider  ibuprofen (ADVIL,MOTRIN) 600 MG tablet Take 1 tablet (600 mg total) by mouth every 6 (six) hours. Patient not taking: Reported on 07/13/2014 08/27/12   Haroldine Laws, CNM  oxyCODONE-acetaminophen (PERCOCET/ROXICET) 5-325 MG per tablet Take 1-2 tablets by mouth every 4 (four) hours as needed. Patient not taking: Reported on 07/13/2014 08/27/12   Haroldine Laws, CNM   BP 110/71 mmHg  Pulse 92  Temp(Src) 99 F (37.2 C) (Oral)  Resp 18  SpO2 97% Physical Exam  Constitutional: She is oriented to person,  place, and time. She appears well-developed and well-nourished. No distress.  HENT:  Head: Atraumatic.  Eyes: Conjunctivae are normal.  Neck: Neck supple.  Cardiovascular: Normal rate and regular rhythm.   Pulmonary/Chest: Effort normal and breath sounds normal.  Abdominal: Soft. Bowel sounds are normal. She exhibits no distension. There is tenderness (epigastric tenderness and left lower quadrant tenderness on palpation without guarding or rebound tenderness. Negative Murphy sign, no pain at McBurney's point, no peritoneal sign.Marland Kitchen).  Neurological: She is alert and oriented to person, place, and time.  Skin: No rash noted.  Psychiatric: She has a normal mood and affect.  Nursing note and vitals  reviewed.   ED Course  Procedures (including critical care time)  Patient here with upper abdominal pain. History of PUD and hiatal hernia. On exam she does have epigastric tenderness however she also has left lower quadrant abdominal tenderness. She has an elevated white count of 15.4 with a left shift. Although this could the gastroenteritis, plan to obtain abdominal pelvic CT scan for further evaluation.  12:47 AM Abdominal and pelvis CT scan is without evidence of acute pathology. Patient states she felt better after receiving treatment. She does have a PCP for follow-up. Will provide medication for symptomatic relief. Return precautions discussed.  Labs Review Labs Reviewed  CBC WITH DIFFERENTIAL/PLATELET - Abnormal; Notable for the following:    WBC 15.4 (*)    Neutrophils Relative % 88 (*)    Neutro Abs 13.6 (*)    Lymphocytes Relative 9 (*)    Monocytes Relative 2 (*)    All other components within normal limits  COMPREHENSIVE METABOLIC PANEL - Abnormal; Notable for the following:    Glucose, Bld 136 (*)    All other components within normal limits  URINALYSIS, ROUTINE W REFLEX MICROSCOPIC  LIPASE, BLOOD  POC URINE PREG, ED    Imaging Review Ct Abdomen Pelvis W Contrast  07/14/2014   CLINICAL DATA:  Pain in the mid abdomen to low abdomen starting this morning and progressively worsening all day. Vomiting since 4 p.m. today.  EXAM: CT ABDOMEN AND PELVIS WITH CONTRAST  TECHNIQUE: Multidetector CT imaging of the abdomen and pelvis was performed using the standard protocol following bolus administration of intravenous contrast.  CONTRAST:  50mL OMNIPAQUE IOHEXOL 300 MG/ML SOLN, OMNIPAQUE IOHEXOL 300 MG/ML SOLN  COMPARISON:  None.  FINDINGS: Mild atelectasis in the lung bases, likely dependent.  The liver, spleen, gallbladder, pancreas, adrenal glands, kidneys, abdominal aorta, inferior vena cava, and retroperitoneal lymph nodes are unremarkable. The stomach, small bowel, and  colon are not abnormally distended. Decompression limits evaluation of bowel wall. No free air or free fluid in the abdomen.  Pelvis: The appendix is not specifically identified but no inflammatory changes are suggested in the right lower quadrant. Scattered mesenteric and right lower quadrant lymph nodes are moderately prominent without pathologic enlargement, likely reactive. No free air or free fluid in the abdomen. No free or loculated pelvic fluid collections. No pelvic mass or lymphadenopathy. Intrauterine device. Uterus and ovaries are not enlarged. Bladder wall is not thickened. Normal alignment of the lumbar spine. No destructive bone lesions.  IMPRESSION: No focal acute process demonstrated in the abdomen or pelvis. No evidence of bowel obstruction. Intrauterine device is present.   Electronically Signed   By: Burman Nieves M.D.   On: 07/14/2014 00:40     EKG Interpretation None      MDM   Final diagnoses:  Upper abdominal pain    BP 112/62  mmHg  Pulse 86  Temp(Src) 99.1 F (37.3 C) (Oral)  Resp 18  SpO2 96%  I have reviewed nursing notes and vital signs. I personally reviewed the imaging tests through PACS system  I reviewed available ER/hospitalization records thought the EMR     Fayrene HelperBowie Leontina Skidmore, PA-C 07/14/14 0049  Candyce ChurnJohn David Wofford III, MD 07/14/14 [phone]

## 2014-07-13 NOTE — ED Notes (Signed)
Pt states she is having abd pain in the middle of her abdomen to the lower part  Pt states pain started this morning and has progressively gotten worse all day  Pt states she has had vomiting since about 4 pm today

## 2014-07-14 LAB — LIPASE, BLOOD: Lipase: 25 U/L (ref 11–59)

## 2014-07-14 MED ORDER — RANITIDINE HCL 150 MG PO CAPS: 150.0000 mg | ORAL_CAPSULE | Freq: Every day | ORAL | Status: DC

## 2014-07-14 MED ORDER — OMEPRAZOLE 20 MG PO CPDR: 20.0000 mg | DELAYED_RELEASE_CAPSULE | Freq: Every day | ORAL | Status: DC

## 2014-07-14 MED ORDER — ONDANSETRON HCL 4 MG PO TABS: 4.0000 mg | ORAL_TABLET | Freq: Four times a day (QID) | ORAL | Status: DC

## 2014-07-14 NOTE — Discharge Instructions (Signed)
Abdominal Pain, Women °Abdominal (stomach, pelvic, or belly) pain can be caused by many things. It is important to tell your doctor: °· The location of the pain. °· Does it come and go or is it present all the time? °· Are there things that start the pain (eating certain foods, exercise)? °· Are there other symptoms associated with the pain (fever, nausea, vomiting, diarrhea)? °All of this is helpful to know when trying to find the cause of the pain. °CAUSES  °· Stomach: virus or bacteria infection, or ulcer. °· Intestine: appendicitis (inflamed appendix), regional ileitis (Crohn's disease), ulcerative colitis (inflamed colon), irritable bowel syndrome, diverticulitis (inflamed diverticulum of the colon), or cancer of the stomach or intestine. °· Gallbladder disease or stones in the gallbladder. °· Kidney disease, kidney stones, or infection. °· Pancreas infection or cancer. °· Fibromyalgia (pain disorder). °· Diseases of the female organs: °¨ Uterus: fibroid (non-cancerous) tumors or infection. °¨ Fallopian tubes: infection or tubal pregnancy. °¨ Ovary: cysts or tumors. °¨ Pelvic adhesions (scar tissue). °¨ Endometriosis (uterus lining tissue growing in the pelvis and on the pelvic organs). °¨ Pelvic congestion syndrome (female organs filling up with blood just before the menstrual period). °¨ Pain with the menstrual period. °¨ Pain with ovulation (producing an egg). °¨ Pain with an IUD (intrauterine device, birth control) in the uterus. °¨ Cancer of the female organs. °· Functional pain (pain not caused by a disease, may improve without treatment). °· Psychological pain. °· Depression. °DIAGNOSIS  °Your doctor will decide the seriousness of your pain by doing an examination. °· Blood tests. °· X-rays. °· Ultrasound. °· CT scan (computed tomography, special type of X-ray). °· MRI (magnetic resonance imaging). °· Cultures, for infection. °· Barium enema (dye inserted in the large intestine, to better view it with  X-rays). °· Colonoscopy (looking in intestine with a lighted tube). °· Laparoscopy (minor surgery, looking in abdomen with a lighted tube). °· Major abdominal exploratory surgery (looking in abdomen with a large incision). °TREATMENT  °The treatment will depend on the cause of the pain.  °· Many cases can be observed and treated at home. °· Over-the-counter medicines recommended by your caregiver. °· Prescription medicine. °· Antibiotics, for infection. °· Birth control pills, for painful periods or for ovulation pain. °· Hormone treatment, for endometriosis. °· Nerve blocking injections. °· Physical therapy. °· Antidepressants. °· Counseling with a psychologist or psychiatrist. °· Minor or major surgery. °HOME CARE INSTRUCTIONS  °· Do not take laxatives, unless directed by your caregiver. °· Take over-the-counter pain medicine only if ordered by your caregiver. Do not take aspirin because it can cause an upset stomach or bleeding. °· Try a clear liquid diet (broth or water) as ordered by your caregiver. Slowly move to a bland diet, as tolerated, if the pain is related to the stomach or intestine. °· Have a thermometer and take your temperature several times a day, and record it. °· Bed rest and sleep, if it helps the pain. °· Avoid sexual intercourse, if it causes pain. °· Avoid stressful situations. °· Keep your follow-up appointments and tests, as your caregiver orders. °· If the pain does not go away with medicine or surgery, you may try: °¨ Acupuncture. °¨ Relaxation exercises (yoga, meditation). °¨ Group therapy. °¨ Counseling. °SEEK MEDICAL CARE IF:  °· You notice certain foods cause stomach pain. °· Your home care treatment is not helping your pain. °· You need stronger pain medicine. °· You want your IUD removed. °· You feel faint or   lightheaded. °· You develop nausea and vomiting. °· You develop a rash. °· You are having side effects or an allergy to your medicine. °SEEK IMMEDIATE MEDICAL CARE IF:  °· Your  pain does not go away or gets worse. °· You have a fever. °· Your pain is felt only in portions of the abdomen. The right side could possibly be appendicitis. The left lower portion of the abdomen could be colitis or diverticulitis. °· You are passing blood in your stools (bright red or black tarry stools, with or without vomiting). °· You have blood in your urine. °· You develop chills, with or without a fever. °· You pass out. °MAKE SURE YOU:  °· Understand these instructions. °· Will watch your condition. °· Will get help right away if you are not doing well or get worse. °Document Released: 03/16/2007 Document Revised: 10/03/2013 Document Reviewed: 04/05/2009 °ExitCare® Patient Information ©2015 [employer]. This information is not intended to replace advice given to you by your health care provider. Make sure you discuss any questions you have with your health care provider. ° °Emergency Department Resource Guide °1) Find a Doctor and Pay Out of Pocket °Although you won't have to find out who is covered by your insurance plan, it is a good idea to ask around and get recommendations. You will then need to call the office and see if the doctor you have chosen will accept you as a new patient and what types of options they offer for patients who are self-pay. Some doctors offer discounts or will set up payment plans for their patients who do not have insurance, but you will need to ask so you aren't surprised when you get to your appointment. ° °2) Contact Your Local Health Department °Not all health departments have doctors that can see patients for sick visits, but many do, so it is worth a call to see if yours does. If you don't know where your local health department is, you can check in your phone book. The CDC also has a tool to help you locate your state's health department, and many state websites also have listings of all of their local health departments. ° °3) Find a Walk-in Clinic °If your illness is  not likely to be very severe or complicated, you may want to try a walk in clinic. These are popping up all over the country in pharmacies, drugstores, and shopping centers. They're usually staffed by nurse practitioners or physician assistants that have been trained to treat common illnesses and complaints. They're usually fairly quick and inexpensive. However, if you have serious medical issues or chronic medical problems, these are probably not your best option. ° °No Primary Care Doctor: °- Call [facility] at  832-8000 - they can help you locate a primary care doctor that  accepts your insurance, provides certain services, etc. °- Physician Referral Service- 1-800-533-3463 ° °Chronic Pain Problems: °Organization         Address  Phone   Notes  °[facility]  (336) 297-2271 Patients need to be referred by their primary care doctor.  ° °Medication Assistance: °Organization         Address  Phone   Notes  °[place] [facility] [address] °[place], [place] [address] (336) 641-8030 --Must be a resident of [place] °-- Must have NO insurance coverage whatsoever (no Medicaid/ Medicare, etc.) °-- The pt. MUST have a primary care doctor that directs their care regularly   and follows them in the community °  °[facility]  (866) 331-1348   °[employer]  (888) 892-1162   ° °Agencies that provide inexpensive medical care: °Organization         Address  Phone   Notes  °[facility]  (336) 832-8035   °[facility]    (336) 832-7272   °[facility] [address] °[place], [place] [address] (336) 832-4777   °[facility] [address], °[place] (336) 271-4999   °[facility]    (336) 373-0678   °[facility]    (336) 272-1050   °[facility] ° [address], [place] Phone:  (336) 832-4444, Fax:  (336) 832-4440 Hours of Operation:  9 am - 6  pm, M-F.  Also accepts Medicaid/Medicare and self-pay.  °[facility] ° [address], [place] Phone: (336) 832-3150, Fax: (336) [phone]. Hours of Operation:  8:30 am - 5:30 pm, M-F.  Also accepts Medicaid and self-pay.  °[facility] [place] [address], [place] Phone: (336) 878-6027   °[facility] [address], [place], [place] (336)723-1848, Ext. 123 Mondays & Thursdays: 7-9 AM.  First 15 patients are seen on a first come, first serve basis. °  ° °Medicaid-accepting [place] Providers: ° °Organization         Address  Phone   Notes  °[facility] [address], [place] (336) 641-2100 Also accepts self-pay patients.  °[facility] [address], [place] ° (336) 856-9996   °[facility] [address], [place] (336) 288-8857   °[facility] [address] [place] [address], [place] (336) 299-7000   °Veita Bland [address], [place]  ° (336) 373-1557 Only accepts [place] Access Medicaid patients after they have their name applied to their card.  ° °Self-Pay (no insurance) in [place]: ° °Organization         Address  Phone   Notes  °Sickle Cell Patients, [facility] [address], [place] (336) 832-1970   °[facility] [address], [place] (336) 832-4400   ° [facility] ° [address], [place] (336) 992-4800   °[facility]/Dr. Osei-Bonsu ° [address] [place] [address], [place] or [address], [place] (336) [phone] Phone number for both [place] and [place] locations is the same.  °[facility] [address], [place] (336) 299-0000   °[facility] [address] [place] [address], [place] or [address] (336) 852-7530 °(336) 878-2260   °[facility] [address], [place] (336) 350-1642, phone; (336) 294-5005, fax Sees patients 1st and 3rd Saturday of every month.  Must not qualify for public or private insurance (i.e. Medicaid, Medicare, [place] Health Choice, Veterans' Benefits) • Household income should be no more than 200% of the poverty level •The clinic cannot treat you if you are pregnant or think you are pregnant • Sexually transmitted diseases are not treated at the clinic.  ° ° °Dental Care: °Organization         Address  Phone  Notes  °[facility] [facility] [address],  (336) 641-6152 Accepts children up to age 21 who   are enrolled in Medicaid or [place] Health Choice; pregnant women with a Medicaid card; and children who have applied for Medicaid or [place] Health Choice, but were declined, whose parents can pay a reduced fee at time of service.  °[facility] [place]  [address], [place] (336) 641-7733 Accepts children up to age 21 who are enrolled in Medicaid or [place] Health Choice; pregnant women with a Medicaid card; and children who have applied for Medicaid or [place] Health Choice, but were declined, whose parents can pay a reduced fee at time of service.  °[facility] ° [address], [place] (336) 641-4533 Patients are seen by appointment only. Walk-ins are not accepted. [facility] will see patients 18 years of age and older. °Monday - Tuesday (8am-5pm) °Most Wednesdays (8:30-5pm) °$30 per visit, cash only  °[facility] ° [address], [place] (336) 641-4533 Patients are seen by appointment only. Walk-ins are not accepted. [facility] will see patients 18 years of age and older. °One Wednesday Evening (Monthly: Volunteer Based).  $30 per visit, cash only  °[facility]  (919) 537-3737 for adults; Children under age 4, call Graduate Pediatric Dentistry at ([phone])  [phone]. Children aged 4-14, please call (919) [phone] to request a pediatric application. ° Dental services are provided in all areas of dental care including fillings, crowns and bridges, complete and partial dentures, implants, gum treatment, root canals, and extractions. Preventive care is also provided. Treatment is provided to both adults and children. °Patients are selected via a lottery and there is often a waiting list. °  °[facility] [address], °[place] ° (336) 763-8833 [email or web address] °  °[facility] [address], [place], [place] (336)723-1848, Ext. 123 Second and Fourth Thursday of each month, opens at 6:30 AM; Clinic ends at 9 AM.  Patients are seen on a first-come first-served basis, and a limited number are seen during each clinic.  ° °[facility] ° [address], [place], [place] (336) 723-7904   Eligibility Requirements °You must have lived in [place], [place], or [place] counties for at least the last three months. °  You cannot be eligible for state or federal sponsored healthcare insurance, including Veterans Administration, Medicaid, or Medicare. °  You generally cannot be eligible for healthcare insurance through your employer.  °  How to apply: °Eligibility screenings are held every Tuesday and Wednesday afternoon from 1:00 pm until 4:00 pm. You do not need an appointment for the interview!  °[facility] [address], [place], [place] 336-631-2330   °[facility]  336-342-8273   °[facility]  336-703-3100   °[facility]  336-570-6415   ° °Behavioral Health Resources in the Community: °Intensive Outpatient Programs °Organization         Address  Phone  Notes  °[facility] [address], [place], [place] 336-878-6098   °[facility] [address], [place], [place] 336-832-9800   °[facility]  [address], [place], [place] ° 336-882-2125   °[facility] [address],  °, [place] 1-800-853-5163 or 336-641-4981   °Substance Abuse Resources °Organization         Address  Phone  Notes  °[facility]  336-882-2125   °[facility]  336-784-9470   °  [facility]  336-285-9073   °[facility]  336-845-3988   °Residential & Outpatient Substance Abuse Program  1-800-659-3381   °Psychological Services °Organization         Address  Phone  Notes  °[facility]  336- 832-9600   °[facility]  336- 378-7881   °[facility] [address], [place] 1-800-853-5163 or 336-641-4981   ° °Mobile Crisis Teams °Organization         Address  Phone  Notes  °[facility], Mobile Crisis Care Unit  1-877-626-1772   °Assertive °[facility] ° [address] [place], [place] 336-834-9664   °Sharon DeEsch [address] °[place] 336-554-5454   ° °Self-Help/Support Groups °Organization         Address  Phone             Notes  °[facility] - variety of support groups  336- 373-1402 Call for more information  °Narcotics Anonymous (NA), [facility] [address], °[place]  2 meetings at this location  ° °Residential Treatment Programs °Organization         Address  Phone  Notes  °[facility] [address],    °[place]  1-866-801-8205   °[facility] ° [address], [place], [place] 704-293-8524   °[facility] [address], [place] 336-845-3988 Admissions: 8am-3pm M-F  °[facility] [address]    °[place], [place] 336-841-1104   °[facility] [address], [place], [place] 336-379-7146   °[facility] [address]  °[place], [place] 336-285-9073   °[facility] - Intensive Outpatient [address], [place], [place]  336-852-3033   °[facility] [address]  °[place], [place] 1-877-615-2722 or 336-784-9470   °[facility] [address] [place], [place] 336-227-7417 Accepts Medicaid  °[facility] [address]  °[place] 1-800-659-3381 Substance Abuse/Addiction Treatment  ° °[place] Behavioral Health Resources °Organization         Address  Phone  Notes  °[facility]  (888) 581-9988   °Julie Brannon, PhD [address] [employer], [place]   (336) 349-5553 or (336) 951-0000   °[facility]   [address] °[place], [place] (336) 349-4454   °[facility] [address], [place], [place] (336) 342-8316 Insurance/Medicaid/sponsorship through [facility]  °[facility] [address]                                    [place], [place] (336) 342-8316 Therapy/tele-psych/case  °[facility] [address]  ° [place], [place] (336) 349-2233    °Dr. Arfeen  (336) 349-4544   °[facility] [place]  [employer] [place] [facility] 1) [address], [place] °2) [address], [place] °3)  [address], [place] (336) 349-3220 °(336) 342-7768 ° °(336) 342-8140   °[place] Child Abuse [facility] (336) 342-1394 or (336) 342-3537 (After Hours)    ° ° °

## 2015-07-06 ENCOUNTER — Ambulatory Visit (INDEPENDENT_AMBULATORY_CARE_PROVIDER_SITE_OTHER): Payer: No Typology Code available for payment source | Admitting: Medical

## 2015-07-06 ENCOUNTER — Encounter: Payer: Self-pay | Admitting: Medical

## 2015-07-06 VITALS — BP 110/70 | HR 96 | Ht 61.75 in | Wt 237.0 lb

## 2015-07-06 DIAGNOSIS — G8929 Other chronic pain: Secondary | ICD-10-CM | POA: Diagnosis not present

## 2015-07-06 DIAGNOSIS — E669 Obesity, unspecified: Secondary | ICD-10-CM

## 2015-07-06 DIAGNOSIS — R112 Nausea with vomiting, unspecified: Secondary | ICD-10-CM | POA: Diagnosis not present

## 2015-07-06 DIAGNOSIS — F411 Generalized anxiety disorder: Secondary | ICD-10-CM | POA: Diagnosis not present

## 2015-07-06 DIAGNOSIS — R1013 Epigastric pain: Secondary | ICD-10-CM | POA: Diagnosis not present

## 2015-07-06 DIAGNOSIS — R1011 Right upper quadrant pain: Secondary | ICD-10-CM | POA: Diagnosis not present

## 2015-07-06 MED ORDER — SUCRALFATE 1 GM/10ML PO SUSP: 1.0000 g | Freq: Three times a day (TID) | ORAL | Status: DC

## 2015-07-06 MED ORDER — DEXLANSOPRAZOLE 60 MG PO CPDR: 60.0000 mg | DELAYED_RELEASE_CAPSULE | Freq: Every day | ORAL | Status: DC

## 2015-07-06 NOTE — Progress Notes (Signed)
Subjective: Chief Complaint  Patient presents with  . New Patient (Initial Visit)    seeing no other doctors. wants to discuss weight loss. nausea and vomiting believes it is related to the GERD. has had ulcers in her stomach. everytime she eats no matter what she eats she feels nauseous.    New patient today.  Last saw [facility].     Here today for several problems.  Has had concerns for years, wanted to come in and discuss.  Has stomach issues.  After eating any meal, gets nauseated.   Doesn't matter if the meal is healthy, greasy or otherwise.  Lately having some acid reflux and cramping.   Has nausea.  Has used Prilosec in the past but not taking anything currently.   Gets pain immediately after eating, lately this has been every meal.   Gets nausea, no vomiting.  No diarrhea.   Has been this way for the last year. Still has her gall bladder.  No prior endoscopy.  Has had contrast study 8 years ago.   Had hiatal hernia, but not sure other findings.    Was seeing a counseling and NP for depression and PTSD.   Taking Zoloft, works ok but this doesn't seem to help panic attacks which can be debilitating.    Wants help with weight loss.  Has problems getting weight off. Has tried various things prior.     Last labs 2015 or earlier.  Curious about iron, has been anemic in the past.   Has had Hgb in 9 range at one point.   Has IUD in place since 3 years ago.    In [facility], wants to pursue RN.   Works night shift.     Past Medical History  Diagnosis Date  . No pertinent past medical history   . Depression     Zoloft in the past  . Anemia     @ last [facility] appt  . Hx of varicella   . H/O hiatal hernia   . Hx of ulcer disease   . NSVD (normal spontaneous vaginal delivery) 08/26/2012   ROS as in subjective  Objective: BP 110/70 mmHg  Pulse 96  Ht 5' 1.75" (1.568 m)  Wt 237 lb (107.502 kg)  BMI 43.72 kg/m2  SpO2 98%  Breastfeeding? No  General appearance: alert, no  distress, WD/WN, obese AA female HEENT: normocephalic, sclerae anicteric, TMs pearly, nares patent, no discharge or erythema, pharynx normal Oral cavity: MMM, no lesion  Neck: supple, no lymphadenopathy, no thyromegaly, no masses Heart: RRR, normal S1, S2, no murmurs Lungs: CTA bilaterally, no wheezes, rhonchi, or rales Abdomen: +bs, soft, generalized abdominal tenderness, worse in epigastric region, +RUQ tenderness, non distended, no masses, no hepatomegaly, no splenomegaly Pulses: 2+ symmetric, upper and lower extremities, normal cap refill   Assessment: Encounter Diagnoses  Name Primary?  . Abdominal pain, chronic, epigastric Yes  . RUQ abdominal pain   . Non-intractable vomiting with nausea, vomiting of unspecified type   . Generalized anxiety disorder   . Obesity      Plan: discussed possible causes of abdominal pain, including gall bladder disease, gastritis, ulcers.   Begin Dexilant and Carafate for possible ulcer, avoid GERD triggers, NSAIDs, reduce stress where possible.   Advised she return in 2 wk for fasting labs and physical, discussed stress reduction, exercise and diet goals.     Mary Gibbs was seen today for new patient (initial visit).  Diagnoses and all orders for this visit:  Abdominal pain, chronic, epigastric  RUQ abdominal pain  Non-intractable vomiting with nausea, vomiting of unspecified type  Generalized anxiety disorder  Obesity  Other orders -     sucralfate (CARAFATE) 1 GM/10ML suspension; Take 10 mLs (1 g total) by mouth 4 (four) times daily -  with meals and at bedtime. -     dexlansoprazole (DEXILANT) 60 MG capsule; Take 1 capsule (60 mg total) by mouth daily.

## 2015-08-06 ENCOUNTER — Telehealth: Payer: Self-pay

## 2015-08-06 ENCOUNTER — Encounter: Payer: No Typology Code available for payment source | Admitting: Medical

## 2015-08-06 NOTE — Telephone Encounter (Signed)
D and send no show letter, and no show charge if needed.

## 2015-08-06 NOTE — Telephone Encounter (Signed)
This patient no showed for their appointment today.Which of the following is necessary for this patient.   A) No follow-up necessary   B) Follow-up urgent. Locate Patient Immediately.   C) Follow-up necessary. Contact patient and Schedule visit in ____ Days.   D) Follow-up Advised. Contact patient and Schedule visit in ____ Days. 

## 2015-08-07 ENCOUNTER — Telehealth: Payer: Self-pay | Admitting: Medical

## 2015-08-07 NOTE — Telephone Encounter (Signed)
Ok, set her back up for physical

## 2015-08-07 NOTE — Telephone Encounter (Signed)
Pt called to schedule another CPE because she missed her CPE appt yesterday due to a flat tire. Pt said she attempted to call us to let us know what was going on and to resched the appt but while on hold to speak to someone in our office her phone battery died. She wanted to apologized to [place] and our staff for missing her appt and not letting us know before the appt time.

## 2015-08-08 NOTE — Telephone Encounter (Signed)
Left on her voicemail to call back and schedule physical

## 2015-08-28 ENCOUNTER — Encounter: Payer: Self-pay | Admitting: Medical

## 2015-08-28 ENCOUNTER — Ambulatory Visit (INDEPENDENT_AMBULATORY_CARE_PROVIDER_SITE_OTHER): Payer: No Typology Code available for payment source | Admitting: Medical

## 2015-08-28 VITALS — BP 108/70 | HR 93 | Ht 61.75 in | Wt 236.0 lb

## 2015-08-28 DIAGNOSIS — E669 Obesity, unspecified: Secondary | ICD-10-CM | POA: Diagnosis not present

## 2015-08-28 DIAGNOSIS — Z0000 Encounter for general adult medical examination without abnormal findings: Secondary | ICD-10-CM | POA: Diagnosis not present

## 2015-08-28 DIAGNOSIS — D509 Iron deficiency anemia, unspecified: Secondary | ICD-10-CM | POA: Insufficient documentation

## 2015-08-28 DIAGNOSIS — Z2821 Immunization not carried out because of patient refusal: Secondary | ICD-10-CM | POA: Insufficient documentation

## 2015-08-28 DIAGNOSIS — R1010 Upper abdominal pain, unspecified: Secondary | ICD-10-CM | POA: Diagnosis not present

## 2015-08-28 LAB — CBC WITH DIFFERENTIAL/PLATELET
Basophils Absolute: 0 10*3/uL (ref 0.0–0.1)
Basophils Relative: 0 % (ref 0–1)
EOS PCT: 2 % (ref 0–5)
Eosinophils Absolute: 0.2 10*3/uL (ref 0.0–0.7)
HEMATOCRIT: 39.4 % (ref 36.0–46.0)
HEMOGLOBIN: 13.4 g/dL (ref 12.0–15.0)
LYMPHS PCT: 27 % (ref 12–46)
Lymphs Abs: 2.5 10*3/uL (ref 0.7–4.0)
MCH: 31.6 pg (ref 26.0–34.0)
MCHC: 34 g/dL (ref 30.0–36.0)
MCV: 92.9 fL (ref 78.0–100.0)
MONO ABS: 0.4 10*3/uL (ref 0.1–1.0)
MONOS PCT: 4 % (ref 3–12)
MPV: 10.2 fL (ref 8.6–12.4)
Neutro Abs: 6.3 10*3/uL (ref 1.7–7.7)
Neutrophils Relative %: 67 % (ref 43–77)
Platelets: 335 10*3/uL (ref 150–400)
RBC: 4.24 MIL/uL (ref 3.87–5.11)
RDW: 13.3 % (ref 11.5–15.5)
WBC: 9.4 10*3/uL (ref 4.0–10.5)

## 2015-08-28 MED ORDER — PHENTERMINE-TOPIRAMATE ER 7.5-46 MG PO CP24: 1.0000 | ORAL_CAPSULE | ORAL | Status: DC

## 2015-08-28 MED ORDER — PANTOPRAZOLE SODIUM 40 MG PO TBEC: DELAYED_RELEASE_TABLET | ORAL | Status: DC

## 2015-08-28 MED ORDER — PHENTERMINE-TOPIRAMATE ER 3.75-23 MG PO CP24: 1.0000 | ORAL_CAPSULE | ORAL | Status: DC

## 2015-08-28 NOTE — Progress Notes (Signed)
Subjective:   HPI  Mary Gibbs is a 30 y.o. female who presents for a complete physical.  Medical care team includes:  Sees eye doctor, dentist, and [facility], DAV[place], [place] here for primary care, established recently  Concerns: Weight loss.  Wants help with medication.  Tried phentermine in the past.  Eats out at lunch, but tries to eat healthy otherwise  Has IUD in place  Reviewed their medical, surgical, family, social, medication, and allergy history and updated chart as appropriate.  Past Medical History  Diagnosis Date  . Depression     Zoloft in the past  . Anemia     @ last [facility] appt  . Hx of varicella   . H/O hiatal hernia   . Hx of ulcer disease   . NSVD (normal spontaneous vaginal delivery) 08/26/2012  . IUD (intrauterine device) in place 2014    Mirena, sees [address] [facility]    Past Surgical History  Procedure Laterality Date  . Tonsillectomy      As a baby    Social History   Social History  . Marital Status: Single    Spouse Name: N/A  . Number of Children: 1  . Years of Education: 16   Occupational History  . LPN    Social History Main Topics  . Smoking status: Former Smoker -- 0.30 packs/day for 4 years    Types: Cigarettes    Quit date: 11/01/2011  . Smokeless tobacco: Never Used  . Alcohol Use: Yes     Comment: occasionally  . Drug Use: No  . Sexual Activity:    Partners: Male    Birth Control/ Protection: IUD     Comment: Mirena   Other Topics Concern  . Not on file   Social History Narrative   In relationship.   2 children.    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   Not exercising much.  Diet - tries to eat healthy.    Family History  Problem Relation Age of Onset  . Epilepsy Sister   . Depression Sister   . Bipolar disorder Sister   . Colon cancer Paternal Grandfather   . Cancer Paternal Grandfather     prostate  . Breast cancer Maternal Aunt     x 3;after menopause  . Cancer Maternal Aunt     breast  .  Hypertension Mother   . Tuberculosis Mother     as a child  . Diabetes Mother     diet controlled  . Depression Mother   . Diabetes Maternal Grandmother   . Hypertension Maternal Grandmother   . Cancer Maternal Aunt     breast  . Heart disease Neg Hx      Current outpatient prescriptions:  .  sertraline (ZOLOFT) 100 MG tablet, Take 100 mg by mouth daily., Disp: , Rfl:  .  pantoprazole (PROTONIX) 40 MG tablet, 1 tablet daily po 45 min prior to breakfast, Disp: 30 tablet, Rfl: 2 .  Phentermine-Topiramate (QSYMIA) 3.75-23 MG CP24, Take 1 capsule by mouth every morning., Disp: 14 capsule, Rfl: 0 .  Phentermine-Topiramate (QSYMIA) 7.5-46 MG CP24, Take 1 capsule by mouth every morning., Disp: 30 capsule, Rfl: 1  No Known Allergies   Review of Systems Constitutional: -fever, -chills, -sweats, -unexpected weight change, -decreased appetite, -fatigue Allergy: -sneezing, -itching, -congestion Dermatology: -changing moles, --rash, -lumps ENT: -runny nose, -ear pain, -sore throat, -hoarseness, -sinus pain, -teeth pain, - ringing in ears, -hearing loss, -nosebleeds Cardiology: -chest pain, -palpitations, -  swelling, -difficulty breathing when lying flat, -waking up short of breath Respiratory: -cough, -shortness of breath, -difficulty breathing with exercise or exertion, -wheezing, -coughing up blood Gastroenterology: -abdominal pain, -nausea, -vomiting, -diarrhea, -constipation, -blood in stool, -changes in bowel movement, -difficulty swallowing or eating Hematology: -bleeding, -bruising  Musculoskeletal: -joint aches, -muscle aches, -joint swelling, -back pain, -neck pain, -cramping, -changes in gait Ophthalmology: denies vision changes, eye redness, itching, discharge Urology: -burning with urination, -difficulty urinating, -blood in urine, -urinary frequency, -urgency, -incontinence Neurology: -headache, -weakness, -tingling, -numbness, -memory loss, -falls, -dizziness Psychology:  -depressed mood, -agitation, -sleep problems     Objective:   Physical Exam  BP 108/70 mmHg  Pulse 93  Ht 5' 1.75" (1.568 m)  Wt 236 lb (107.049 kg)  BMI 43.54 kg/m2  General appearance: alert, no distress, WD/WN, AA female Skin: right upper arm and low back with tattoos, few scattered macules, no worrisome lesions HEENT: normocephalic, conjunctiva/corneas normal, sclerae anicteric, PERRLA, EOMi, nares patent, no discharge or erythema, pharynx normal Oral cavity: MMM, tongue normal, teeth normal Neck: supple, no lymphadenopathy, no thyromegaly, no masses, normal ROM, no bruits Chest: non tender, normal shape and expansion Heart: RRR, normal S1, S2, no murmurs Lungs: CTA bilaterally, no wheezes, rhonchi, or rales Abdomen: +bs, soft, non tender, non distended, no masses, no hepatomegaly, no splenomegaly, no bruits Back: non tender, normal ROM, no scoliosis Musculoskeletal: upper extremities non tender, no obvious deformity, normal ROM throughout, lower extremities non tender, no obvious deformity, normal ROM throughout Extremities: no edema, no cyanosis, no clubbing Pulses: 2+ symmetric, upper and lower extremities, normal cap refill Neurological: alert, oriented x 3, CN2-12 intact, strength normal upper extremities and lower extremities, sensation normal throughout, DTRs 2+ throughout, no cerebellar signs, gait normal Psychiatric: normal affect, behavior normal, pleasant  Breast/gyn/rectal - deferred to gyn   Assessment and Plan :    Encounter Diagnoses  Name Primary?  . Encounter for health maintenance examination in adult Yes  . Obesity   . Anemia, iron deficiency   . Pain of upper abdomen   . Influenza vaccination declined     Physical exam - discussed healthy lifestyle, diet, exercise, preventative care, vaccinations, and addressed their concerns.  Handout given. See your eye doctor yearly for routine vision care. See your dentist yearly for routine dental care including  hygiene visits twice yearly. See your gynecologist yearly for routine gynecological care. Hx/o anemia - prior iron therapy.  Labs today routine labs today strongly recommended she get yearly flu shot particularly working in Hospice.    Abdominal pain improved on PPI, so c/t for now. Begin trial of Qsymia, discussed diet, exercise, risks/benefits of medication. Follow-up pending labs, 4-6 wk on Qsymia

## 2015-08-29 LAB — HEMOGLOBIN A1C
Hgb A1c MFr Bld: 5.5 % (ref <5.7)
Mean Plasma Glucose: 111 mg/dL

## 2015-08-29 LAB — COMPREHENSIVE METABOLIC PANEL
ALBUMIN: 4.1 g/dL (ref 3.6–5.1)
ALK PHOS: 50 U/L (ref 33–115)
ALT: 15 U/L (ref 6–29)
AST: 18 U/L (ref 10–30)
BUN: 11 mg/dL (ref 7–25)
CALCIUM: 9.2 mg/dL (ref 8.6–10.2)
CHLORIDE: 103 mmol/L (ref 98–110)
CO2: 23 mmol/L (ref 20–31)
CREATININE: 0.74 mg/dL (ref 0.50–1.10)
Glucose, Bld: 97 mg/dL (ref 65–99)
POTASSIUM: 4.6 mmol/L (ref 3.5–5.3)
Sodium: 136 mmol/L (ref 135–146)
Total Bilirubin: 0.4 mg/dL (ref 0.2–1.2)
Total Protein: 7.5 g/dL (ref 6.1–8.1)

## 2015-08-29 LAB — IRON AND TIBC
%SAT: 21 % (ref 11–50)
Iron: 60 ug/dL (ref 40–190)
TIBC: 288 ug/dL (ref 250–450)
UIBC: 228 ug/dL (ref 125–400)

## 2015-08-29 LAB — TSH: TSH: 1.05 mIU/L

## 2015-08-29 LAB — LIPID PANEL
CHOLESTEROL: 207 mg/dL — AB (ref 125–200)
HDL: 47 mg/dL (ref >=46)
LDL Cholesterol: 143 mg/dL — ABNORMAL HIGH (ref <130)
TRIGLYCERIDES: 84 mg/dL (ref <150)
Total CHOL/HDL Ratio: 4.4 Ratio (ref <=5.0)
VLDL: 17 mg/dL (ref <30)

## 2015-09-26 ENCOUNTER — Telehealth: Payer: Self-pay | Admitting: Medical

## 2015-09-26 NOTE — Telephone Encounter (Signed)
Pt called and inquired about her diet medication Qsymia. She states that she has heard that if the two medications that make of qsymia are ordered separately they are much less expensive. She is asking if she can get  two prescriptions for refills. Please advise pt she can be reached at [phone].

## 2015-09-26 NOTE — Telephone Encounter (Signed)
Did she take the initial qsymia 2 wk and 4 wk trials? If so, then needs to come back to recheck first

## 2015-09-27 NOTE — Telephone Encounter (Signed)
Pt called back to let Vincenza HewsShane know that she only took the initial 2 wk trial.

## 2015-09-27 NOTE — Telephone Encounter (Signed)
LMTCB

## 2015-09-28 ENCOUNTER — Other Ambulatory Visit: Payer: Self-pay | Admitting: Medical

## 2015-09-28 MED ORDER — TOPIRAMATE 25 MG PO TABS: 25.0000 mg | ORAL_TABLET | Freq: Every day | ORAL | Status: DC

## 2015-09-28 MED ORDER — PHENTERMINE HCL 37.5 MG PO TABS: 37.5000 mg | ORAL_TABLET | Freq: Every day | ORAL | Status: DC

## 2015-09-28 NOTE — Telephone Encounter (Signed)
Spoke with patient and she actually picked up the Qsymia yesterday and started it today. She will disregard topamax rx sent this am. Scheduled her for 1 mo fu 10/25/15.

## 2015-09-28 NOTE — Telephone Encounter (Signed)
Ok, lets try 1 month of the separate components, so cal out Phentermine to take daily in the morning, and I sent Topamax to be taken QHS.  Make 55mo f/u appt before she runs out completley of this

## 2015-10-25 ENCOUNTER — Ambulatory Visit (INDEPENDENT_AMBULATORY_CARE_PROVIDER_SITE_OTHER): Payer: No Typology Code available for payment source | Admitting: Medical

## 2015-10-25 ENCOUNTER — Telehealth: Payer: Self-pay

## 2015-10-25 ENCOUNTER — Encounter: Payer: Self-pay | Admitting: Medical

## 2015-10-25 ENCOUNTER — Other Ambulatory Visit: Payer: Self-pay | Admitting: Medical

## 2015-10-25 VITALS — BP 100/70 | HR 93 | Ht 61.75 in | Wt 234.0 lb

## 2015-10-25 DIAGNOSIS — E669 Obesity, unspecified: Secondary | ICD-10-CM

## 2015-10-25 DIAGNOSIS — R109 Unspecified abdominal pain: Secondary | ICD-10-CM | POA: Diagnosis not present

## 2015-10-25 DIAGNOSIS — E639 Nutritional deficiency, unspecified: Secondary | ICD-10-CM | POA: Diagnosis not present

## 2015-10-25 MED ORDER — PHENTERMINE-TOPIRAMATE ER 11.25-69 MG PO CP24: 1.0000 | ORAL_CAPSULE | Freq: Every day | ORAL | Status: DC

## 2015-10-25 MED ORDER — PHENTERMINE HCL 37.5 MG PO TABS: 37.5000 mg | ORAL_TABLET | Freq: Every day | ORAL | Status: DC

## 2015-10-25 MED ORDER — TOPIRAMATE 50 MG PO TABS: 50.0000 mg | ORAL_TABLET | Freq: Every day | ORAL | Status: DC

## 2015-10-25 NOTE — Telephone Encounter (Signed)
Pt wanted to let you know prescription written for her today was $150, she would like to have the 2 alternatives called in to [facility]. Trixie Rude/RLB

## 2015-10-25 NOTE — Telephone Encounter (Signed)
Call out the separate phentermine and Topamax.  F/u 4-6 wk.

## 2015-10-25 NOTE — Progress Notes (Signed)
Subjective: Chief Complaint  Patient presents with  . Follow-up    states it is going ok no problems or complaints   Here for f/u on Qsymia and weight loss efforts.   She notes initially taking the 2 wk free Qsymia trial in late March.  Ended up getting the regular dose script on 09/28/15.   Has a few pills left, so been using the qysmia regularly.  At home she notes her weight was 240lb, down to 232lb now per home scale.   She notes she is drinking a lot of water, has made big change with diet.   Has cut out big breakfast, cut out juice, drinking only water.   Denies any side effects of the medications.   Not exercising much though.  No other aggravating or relieving factors. No other complaint.   Past Medical History  Diagnosis Date  . Depression     Zoloft in the past  . Anemia     [facility]  . Hx of varicella   . H/O hiatal hernia   . Hx of ulcer disease   . NSVD (normal spontaneous vaginal delivery) 08/26/2012  . IUD (intrauterine device) in place 2014    [address], [address] [facility]   ROS as in subjective   Objective: BP 100/70 mmHg  Pulse 93  Ht 5' 1.75" (1.568 m)  Wt 234 lb (106.142 kg)  BMI 43.17 kg/m2  Wt Readings from Last 3 Encounters:  10/25/15 234 lb (106.142 kg)  08/28/15 236 lb (107.049 kg)  07/06/15 237 lb (107.502 kg)   Gen: wd, wn, nad Heart; RRR, normal s1, s2, no murmurs Ext: no edema    Assessment: Encounter Diagnoses  Name Primary?  . Obesity Yes  . Abdominal pain, unspecified abdominal location   . Poor diet      Plan: increase to higher dose of Qsymia.  discussed diet, gave exercise recommendations, discussed expectations, water intake, f/u in 76mo.

## 2015-10-26 ENCOUNTER — Telehealth: Payer: Self-pay

## 2015-10-26 NOTE — Telephone Encounter (Signed)
Phoned in pt aware 

## 2015-10-26 NOTE — Telephone Encounter (Signed)
Approval obtained from [facility] for Phentermine 37.5mg  # 30 for 30 days. Dx code E66.9 with 43.1 BMI. Approval good from 10-26-2015 to 01-26-2016. LM on pt VCM with information and [facility]. Trixie Rude/RLB

## 2015-11-27 ENCOUNTER — Encounter (HOSPITAL_COMMUNITY): Payer: Self-pay

## 2015-11-27 ENCOUNTER — Emergency Department (HOSPITAL_COMMUNITY): Payer: PRIVATE HEALTH INSURANCE

## 2015-11-27 ENCOUNTER — Emergency Department (HOSPITAL_COMMUNITY)
Admission: EM | Admit: 2015-11-27 | Discharge: 2015-11-27 | Disposition: A | Discharge status: Home / Self Care | Payer: PRIVATE HEALTH INSURANCE | Attending: Dermatology | Admitting: Dermatology

## 2015-11-27 ENCOUNTER — Other Ambulatory Visit: Payer: Self-pay | Admitting: Medical

## 2015-11-27 DIAGNOSIS — F410 Panic disorder [episodic paroxysmal anxiety] without agoraphobia: Secondary | ICD-10-CM | POA: Insufficient documentation

## 2015-11-27 DIAGNOSIS — Z5321 Procedure and treatment not carried out due to patient leaving prior to being seen by health care provider: Secondary | ICD-10-CM | POA: Insufficient documentation

## 2015-11-27 LAB — BASIC METABOLIC PANEL
ANION GAP: 7 (ref 5–15)
BUN: 9 mg/dL (ref 6–20)
CHLORIDE: 107 mmol/L (ref 101–111)
CO2: 21 mmol/L — ABNORMAL LOW (ref 22–32)
Calcium: 9.3 mg/dL (ref 8.9–10.3)
Creatinine, Ser: 0.87 mg/dL (ref 0.44–1.00)
Glucose, Bld: 112 mg/dL — ABNORMAL HIGH (ref 65–99)
POTASSIUM: 3.6 mmol/L (ref 3.5–5.1)
SODIUM: 135 mmol/L (ref 135–145)

## 2015-11-27 LAB — CBC
HEMATOCRIT: 39 % (ref 36.0–46.0)
HEMOGLOBIN: 12.8 g/dL (ref 12.0–15.0)
MCH: 29.9 pg (ref 26.0–34.0)
MCHC: 32.8 g/dL (ref 30.0–36.0)
MCV: 91.1 fL (ref 78.0–100.0)
Platelets: 371 10*3/uL (ref 150–400)
RBC: 4.28 MIL/uL (ref 3.87–5.11)
RDW: 13 % (ref 11.5–15.5)
WBC: 8.3 10*3/uL (ref 4.0–10.5)

## 2015-11-27 LAB — I-STAT TROPONIN, ED: Troponin i, poc: 0 ng/mL (ref 0.00–0.08)

## 2015-11-27 NOTE — ED Notes (Signed)
Called to Re-check Vitals. No Answer

## 2015-11-27 NOTE — ED Notes (Signed)
Per pT, Pt was arguing with boyfriend when she started to have a panic attack. Pt reports chest pain and SOB. Pt took an Ativan 1 mg before the arguing began, but reports no official relief. Pt was transported by EMS. Pt was calm when arrived to the ED. Eyes red from crying, chest pain noted, and breathing normal.

## 2015-11-27 NOTE — ED Notes (Signed)
Called for Pt to re-check vitals. Again no answer times 2.

## 2016-01-18 ENCOUNTER — Ambulatory Visit: Payer: No Typology Code available for payment source | Admitting: Medical

## 2016-01-18 ENCOUNTER — Telehealth: Payer: Self-pay

## 2016-01-18 NOTE — Telephone Encounter (Signed)
This patient no showed for their appointment today.Which of the following is necessary for this patient.   A) No follow-up necessary   B) Follow-up urgent. Locate Patient Immediately.   C) Follow-up necessary. Contact patient and Schedule visit in ____ Days.   D) Follow-up Advised. Contact patient and Schedule visit in ____ Days. 

## 2016-01-18 NOTE — Telephone Encounter (Signed)
D

## 2016-01-27 ENCOUNTER — Other Ambulatory Visit: Payer: Self-pay | Admitting: Medical

## 2016-01-28 ENCOUNTER — Encounter: Payer: Self-pay | Admitting: Medical

## 2016-01-28 NOTE — Telephone Encounter (Signed)
Is this ok to refill?  

## 2016-01-28 NOTE — Telephone Encounter (Signed)
No show letter sent back to courtney for call

## 2016-01-28 NOTE — Telephone Encounter (Signed)
Lm for pt to call back and reschedule.

## 2016-07-21 ENCOUNTER — Ambulatory Visit (INDEPENDENT_AMBULATORY_CARE_PROVIDER_SITE_OTHER): Payer: Managed Care, Other (non HMO) | Admitting: Medical

## 2016-07-21 ENCOUNTER — Encounter: Payer: Self-pay | Admitting: Medical

## 2016-07-21 VITALS — BP 126/88 | HR 88 | Ht 62.0 in | Wt 231.0 lb

## 2016-07-21 DIAGNOSIS — F418 Other specified anxiety disorders: Secondary | ICD-10-CM | POA: Diagnosis not present

## 2016-07-21 MED ORDER — PHENTERMINE HCL 37.5 MG PO TABS: 37.5000 mg | ORAL_TABLET | Freq: Every day | ORAL | 0 refills | Status: DC

## 2016-07-21 NOTE — Patient Instructions (Signed)
Call insurance about coverage for the following weight loss medications:  Contrave  Qsymia  Belviq  Saxenda

## 2016-07-21 NOTE — Progress Notes (Signed)
Subjective: Chief Complaint  Patient presents with  . weight loss   Here for consult on weight loss.   I last saw her 10/2015 for same.  She notes she has been doing some cross fit for the past month.   Going 3 days per week with cross fit.  Still is overeating, hard to control appetite.   Lost some weight with phentermine from last visit.   Goal weight is around 175lb.   Last time she was at that weight was years ago, maybe 10 years ago.   Had son at age 31yo.   5 years ago was 190lb.  Has used trainer in the past, didn't see as much benefit as with the group at [employer].   She did get a diet plan with cross fit and trying to follow this.   Water - trying to increase.   Has tried short term Qsymia trial.  phentermine was cheaper.   Parents don't have iissueswith obesity.   Working 40 hours per week.   Regarding medications, has been on Bupropion back in summer for mood, was on sertraline at the same time.  Weaned her self off.  Didn't feel right on those medications.   Was seeing [facility] psychiatry.    Got too expensive to go their.  No on sertraline or buproprion now.   Takes Ativan once daily.  Wants me to help with mood medications, can't afford to go to psychiatry regularly.  Mood ok lately.   Was going through some stress back in summer.  No other aggravating or relieving factors. No other complaint.  Past Medical History:  Diagnosis Date  . Anemia    [facility]  . Depression    Zoloft in the past  . H/O hiatal hernia   . Hx of ulcer disease   . Hx of varicella   . IUD (intrauterine device) in place 2014   Mirena, [facility] [address] [facility]  . NSVD (normal spontaneous vaginal delivery) 08/26/2012   Current Outpatient Prescriptions on File Prior to Visit  Medication Sig Dispense Refill  . pantoprazole (PROTONIX) 40 MG tablet TAKE 1 TABLET BY MOUTH 45 MINS PRIOR TO BREAKFAST 30 tablet 2  . sertraline (ZOLOFT) 100 MG tablet Take 100 mg by mouth daily. Reported on 10/25/2015    .  topiramate (TOPAMAX) 50 MG tablet TAKE 1 TABLET BY MOUTH DAILY. (Patient not taking: Reported on 07/21/2016) 30 tablet 1   No current facility-administered medications on file prior to visit.    ROS as in subjective   Objective: BP 126/88   Pulse 88   Ht 5\' 2"  (1.575 m)   Wt 231 lb (104.8 kg)   BMI 42.25 kg/m   Wt Readings from Last 3 Encounters:  07/21/16 231 lb (104.8 kg)  11/27/15 226 lb (102.5 kg)  10/25/15 234 lb (106.1 kg)   Gen: wd, wn, nad Heart rrr, normal s1, s2, no murmurs Lungs clear Ext: no edema Psych pleasant good eye contact, answer questions appropriately    Assessment: Encounter Diagnoses  Name Primary?  . Depression with anxiety Yes  . Morbid obesity (HCC)      Plan: Discussed current efforts, setting goals, discused medication options.  Restart short term phentermine.  Check insurance coverage for the other weight loss medications discussed.  C/t [employer].   Discused diet strategies, water intake.   F/u 1-3558mo  F/u for physical  Depression/anxiety - c/t ativan prn, and I will work with her for  now since psychiatry copay cost prohibitive.   Discused mood, prior mental health visits in 2017.   Mary Gibbs was seen today for weight loss.  Diagnoses and all orders for this visit:  Depression with anxiety  Morbid obesity (HCC)  Other orders -     phentermine (ADIPEX-P) 37.5 MG tablet; Take 1 tablet (37.5 mg total) by mouth daily before breakfast.

## 2016-09-02 ENCOUNTER — Telehealth: Payer: Self-pay | Admitting: Medical

## 2016-09-02 NOTE — Telephone Encounter (Signed)
Pt states she will be running out of her Ativan & she had discussed that she wanted you to fill it for you because it is so expensive for her to go psychiatry.  I informed her you were on vacation and she has enough to hold her until you get back.  Please let her know

## 2016-09-09 ENCOUNTER — Other Ambulatory Visit: Payer: Self-pay | Admitting: Medical

## 2016-09-09 MED ORDER — LORAZEPAM 1 MG PO TABS: 1.0000 mg | ORAL_TABLET | Freq: Every day | ORAL | 0 refills | Status: DC

## 2016-09-09 NOTE — Telephone Encounter (Signed)
pls call out Ativan for daily use prn, #30, no refill, make f/u appt as she was due back in 1-2 mo f/u

## 2016-09-09 NOTE — Telephone Encounter (Signed)
Called  [facility]

## 2016-10-20 ENCOUNTER — Other Ambulatory Visit: Payer: Self-pay

## 2016-10-20 ENCOUNTER — Telehealth: Payer: Self-pay

## 2016-10-20 MED ORDER — PANTOPRAZOLE SODIUM 40 MG PO TBEC: DELAYED_RELEASE_TABLET | ORAL | 2 refills | Status: DC

## 2016-10-20 NOTE — Telephone Encounter (Signed)
Fax request rcvd in office for refill of pantoprazole 40mg  tablet for [facility] day supply [facility] [address]

## 2016-10-21 NOTE — Telephone Encounter (Signed)
Have sent this already

## 2016-10-21 NOTE — Telephone Encounter (Signed)
2nd request rcvd in office.

## 2016-12-16 ENCOUNTER — Encounter: Payer: Self-pay | Admitting: Internal Medicine

## 2017-01-13 ENCOUNTER — Telehealth: Payer: Self-pay

## 2017-01-13 ENCOUNTER — Other Ambulatory Visit: Payer: Self-pay

## 2017-01-13 MED ORDER — PANTOPRAZOLE SODIUM 40 MG PO TBEC: DELAYED_RELEASE_TABLET | ORAL | 1 refills | Status: DC

## 2017-01-13 NOTE — Telephone Encounter (Signed)
Sent med in 

## 2017-01-13 NOTE — Telephone Encounter (Signed)
[facility] day supply.

## 2017-02-05 ENCOUNTER — Ambulatory Visit: Payer: PRIVATE HEALTH INSURANCE | Admitting: Gastroenterology

## 2017-02-09 ENCOUNTER — Ambulatory Visit: Payer: PRIVATE HEALTH INSURANCE | Admitting: Internal Medicine

## 2017-04-01 ENCOUNTER — Other Ambulatory Visit (INDEPENDENT_AMBULATORY_CARE_PROVIDER_SITE_OTHER): Payer: 59

## 2017-04-01 ENCOUNTER — Encounter: Payer: Self-pay | Admitting: Internal Medicine

## 2017-04-01 ENCOUNTER — Ambulatory Visit (INDEPENDENT_AMBULATORY_CARE_PROVIDER_SITE_OTHER): Payer: 59 | Admitting: Internal Medicine

## 2017-04-01 VITALS — BP 110/76 | HR 94 | Ht 61.0 in | Wt 219.0 lb

## 2017-04-01 DIAGNOSIS — R1013 Epigastric pain: Secondary | ICD-10-CM

## 2017-04-01 DIAGNOSIS — G43A0 Cyclical vomiting, not intractable: Secondary | ICD-10-CM

## 2017-04-01 DIAGNOSIS — R1115 Cyclical vomiting syndrome unrelated to migraine: Secondary | ICD-10-CM

## 2017-04-01 DIAGNOSIS — K5909 Other constipation: Secondary | ICD-10-CM

## 2017-04-01 LAB — COMPREHENSIVE METABOLIC PANEL
ALBUMIN: 3.8 g/dL (ref 3.5–5.2)
ALT: 8 U/L (ref 0–35)
AST: 12 U/L (ref 0–37)
Alkaline Phosphatase: 42 U/L (ref 39–117)
BILIRUBIN TOTAL: 0.4 mg/dL (ref 0.2–1.2)
BUN: 11 mg/dL (ref 6–23)
CO2: 30 mEq/L (ref 19–32)
CREATININE: 0.68 mg/dL (ref 0.40–1.20)
Calcium: 9.1 mg/dL (ref 8.4–10.5)
Chloride: 104 mEq/L (ref 96–112)
GFR: 128.84 mL/min (ref >60.00)
Glucose, Bld: 110 mg/dL — ABNORMAL HIGH (ref 70–99)
POTASSIUM: 3.9 meq/L (ref 3.5–5.1)
SODIUM: 139 meq/L (ref 135–145)
TOTAL PROTEIN: 7 g/dL (ref 6.0–8.3)

## 2017-04-01 LAB — CBC WITH DIFFERENTIAL/PLATELET
Basophils Absolute: 0 10*3/uL (ref 0.0–0.1)
Basophils Relative: 0.4 % (ref 0.0–3.0)
Eosinophils Absolute: 0.1 10*3/uL (ref 0.0–0.7)
Eosinophils Relative: 1.4 % (ref 0.0–5.0)
HCT: 38.6 % (ref 36.0–46.0)
HEMOGLOBIN: 12.8 g/dL (ref 12.0–15.0)
Lymphocytes Relative: 27.8 % (ref 12.0–46.0)
Lymphs Abs: 2.1 10*3/uL (ref 0.7–4.0)
MCHC: 33.2 g/dL (ref 30.0–36.0)
MCV: 95.6 fl (ref 78.0–100.0)
MONO ABS: 0.3 10*3/uL (ref 0.1–1.0)
Monocytes Relative: 4.5 % (ref 3.0–12.0)
Neutro Abs: 4.9 10*3/uL (ref 1.4–7.7)
Neutrophils Relative %: 65.9 % (ref 43.0–77.0)
Platelets: 315 10*3/uL (ref 150.0–400.0)
RBC: 4.04 Mil/uL (ref 3.87–5.11)
RDW: 13.4 % (ref 11.5–15.5)
WBC: 7.5 10*3/uL (ref 4.0–10.5)

## 2017-04-01 LAB — TSH: TSH: 0.54 u[IU]/mL (ref 0.35–4.50)

## 2017-04-01 NOTE — Patient Instructions (Signed)
Your physician has requested that you go to the basement for the following lab work before leaving today: CBC/diff, CMET, TSH   Dr Leone PayorGessner recommends that you complete a bowel purge (to clean out your bowels). Please do the following: Purchase a bottle of Miralax over the counter as well as a box of 5 mg dulcolax tablets. Take 4 dulcolax tablets. Wait 1 hour. You will then drink 6-8 capfuls of Miralax mixed in an adequate amount of water/juice/gatorade (you may choose which of these liquids to drink) over the next 2-3 hours. You should expect results within 1 to 6 hours after completing the bowel purge.   Use a tablespoon of Benefiber daily, handout provided.   [facility]17 at 8:30AM. Please arrive 20 minutes prior to your appointment for registration. Make certain not to have anything to eat or drink 6 hours prior to your appointment. Should you need to reschedule your appointment, please contact radiology at [phone]. This test typically takes about 30 minutes to perform.   I appreciate the opportunity to care for you. Stan Headarl Gessner, MD, [facility]

## 2017-04-01 NOTE — Progress Notes (Signed)
Mary Gibbs 31 y.o. 07/06/1985 [id number]  Assessment & Plan:   Encounter Diagnoses  Name Primary?  . Abdominal pain, epigastric Yes  . Non-intractable cyclical vomiting with nausea   . Chronic constipation    I think her upper abdominal symptoms may be from gallstones.  Start with an abdominal ultrasound limited right upper quadrant.  Like she has a functional constipation problem.  Even though she had a perineal tear with childbirth in 2014 there seems to be good sphincter function and appropriate anorectal function on rectal exam.  Add Benefiber 1 tablespoon daily after using a MiraLAX purge.   Labs to include CBC CMET and TSH  I appreciate the opportunity to care for this patient. 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  No particular food triggers just after most meals.  Even when she eats oatmeal with fruit in the morning now.  She does say if she eats a better diet she has less heartburn but this pain persists.  Nausea and vomiting intermittently as well.  One caffeinated beverage daily  Constipation is an issue she will go up to 5 days without taking a laxative, she usually uses some sort of magnesium that is very powerful she says.  She is able to defecate though there is some straining.  She had a perineal laceration with childbirth in 2014.  He does not describe incontinence or leakage.  Hemorrhoids have not bothered her in some time but they have swollen and been irritated.  She is not describing rectal bleeding.  The symptoms are chronic for many years, she feels full in her left lower quadrant and never feels empty.  Difficult to cleanse as well at times.  Has not tried regular daily laxatives  or fiber. No Known Allergies Current Meds  Medication Sig  . LORazepam (ATIVAN) 1 MG tablet Take 1 tablet (1 mg total) by mouth at bedtime.  . pantoprazole (PROTONIX) 40 MG tablet TAKE 1 TABLET BY MOUTH 45 MINS PRIOR TO BREAKFAST   Past Medical History:  Diagnosis Date  . Anemia    [facility]  . Depression    Zoloft in the past  . H/O hiatal hernia   . Hx of ulcer disease   . Hx of varicella   . IUD (intrauterine device) in place 2014   [facility]rena, [facility] [address] [facility]  . NSVD (normal spontaneous vaginal delivery) 08/26/2012   Past Surgical History:  Procedure Laterality Date  . TONSILLECTOMY     As a baby   Social History   Social History  . Marital status: Single    Spouse name: N/A  . Number of children: 2  . Years of education: 16   Occupational History  . LPN    Social History Main Topics  . Smoking status: Former Smoker    Packs/day: 0.50    Years: 4.00    Types: Cigarettes    Quit date: 04/21/2016  . Smokeless tobacco: Never Used  . Alcohol use Yes     Comment: occasionally  . Drug use: No  . Sexual activity: Yes    Partners: Male    Birth control/ protection: IUD     Comment: Mirena  Social History Narrative   In relationship.   2 children.    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   Not exercising much.  Diet - tries to eat healthy.   Originally from [place]   family history includes Bipolar disorder in her sister; Breast cancer in her maternal aunt; Cancer in her paternal grandfather; Colon cancer in her paternal grandfather; Depression in her mother and sister; Diabetes in her maternal grandmother and mother; Epilepsy in her sister; Hypertension in her maternal grandmother and mother; Stomach cancer in her paternal grandmother; Tuberculosis in her mother.   Review of Systems As per HPI.  The remainder of the review of system appears negative at this time.  Objective:   Physical Exam @BP  110/76   Pulse 94    Ht 5\' 1"  (1.549 m)   Wt 219 lb (99.3 kg)   BMI 41.38 kg/m @  General:  Well-developed, well-nourished and in no acute distress Eyes:  anicteric. ENT:   Mouth and posterior pharynx free of lesions.  Neck:   supple w/o thyromegaly or mass.  Lungs: Clear to auscultation bilaterally. Heart:  S1S2, no rubs, murmurs, gallops. Abdomen:  soft, non-tender, no hepatosplenomegaly, hernia, or mass and BS+.  Rectal:  Patti [place], CMA present.  Anoderm inspection revealed sl gluteal erythema Anal wink was + Digital exam revealed normal resting tone and voluntary squeeze. No mass or rectocele present. Simulated defecation with valsalva revealed appropriate abdominal contraction and descent.    Lymph:  no cervical or supraclavicular adenopathy. Extremities:   no edema, cyanosis or clubbing Skin   no rash. Neuro:  A&O x 3.  Psych:  appropriate mood and  Affect.   Data Reviewed: Labs in the EMR from 2017 ER visit a 16 including CT scan abdomen and pelvis negative

## 2017-04-03 NOTE — Progress Notes (Signed)
Let her know labs are ok Will see what 11/9 US shows

## 2017-04-10 ENCOUNTER — Ambulatory Visit
Admission: RE | Admit: 2017-04-10 | Discharge: 2017-04-10 | Disposition: A | Discharge status: Home / Self Care | Payer: 59 | Source: Ambulatory Visit | Attending: Internal Medicine | Admitting: Internal Medicine

## 2017-04-10 DIAGNOSIS — R1013 Epigastric pain: Secondary | ICD-10-CM

## 2017-04-10 DIAGNOSIS — R1115 Cyclical vomiting syndrome unrelated to migraine: Secondary | ICD-10-CM

## 2017-04-10 DIAGNOSIS — K5909 Other constipation: Secondary | ICD-10-CM

## 2017-04-10 NOTE — Progress Notes (Signed)
She has gallstones  Next step is to see a surgeon  Please refer to Dr. Marcille BlancoMatt Tsuei

## 2017-04-14 ENCOUNTER — Telehealth: Payer: Self-pay | Admitting: Internal Medicine

## 2017-04-14 NOTE — Telephone Encounter (Signed)
Referral was faxed to [facility] yesterday. Pt given the phone number to call [facility] to try and expedite her appt.

## 2017-04-28 ENCOUNTER — Ambulatory Visit: Payer: Managed Care, Other (non HMO) | Admitting: Medical

## 2017-04-28 ENCOUNTER — Encounter: Payer: Self-pay | Admitting: Medical

## 2017-04-28 VITALS — BP 122/80 | HR 83 | Wt 217.0 lb

## 2017-04-28 DIAGNOSIS — W57XXXA Bitten or stung by nonvenomous insect and other nonvenomous arthropods, initial encounter: Secondary | ICD-10-CM

## 2017-04-28 DIAGNOSIS — I889 Nonspecific lymphadenitis, unspecified: Secondary | ICD-10-CM | POA: Diagnosis not present

## 2017-04-28 DIAGNOSIS — K8020 Calculus of gallbladder without cholecystitis without obstruction: Secondary | ICD-10-CM

## 2017-04-28 DIAGNOSIS — L089 Local infection of the skin and subcutaneous tissue, unspecified: Secondary | ICD-10-CM

## 2017-04-28 DIAGNOSIS — F419 Anxiety disorder, unspecified: Secondary | ICD-10-CM

## 2017-04-28 MED ORDER — LORAZEPAM 1 MG PO TABS: 1.0000 mg | ORAL_TABLET | Freq: Every day | ORAL | 1 refills | Status: DC

## 2017-04-28 MED ORDER — CEPHALEXIN 500 MG PO CAPS: 500.0000 mg | ORAL_CAPSULE | Freq: Three times a day (TID) | ORAL | 0 refills | Status: DC

## 2017-04-28 MED ORDER — ALIGN 4 MG PO CAPS: 1.0000 | ORAL_CAPSULE | Freq: Every day | ORAL | 2 refills | Status: AC

## 2017-04-28 MED ORDER — IBUPROFEN 600 MG PO TABS: 600.0000 mg | ORAL_TABLET | Freq: Three times a day (TID) | ORAL | 0 refills | Status: DC | PRN

## 2017-04-28 NOTE — Progress Notes (Signed)
Subjective: Chief Complaint  Patient presents with  . knot on neck    knot on knot , possible bit, painful x 1 week    Here for knot/rash on right neck.  Noticed this last Friday about 5 days ago.  Thinks something bit her, possibly a spider.  Started as a little puffy area that is now bigger, more tender and feels hard underneath. No fever, no nausea, no body aches or chills, no other symptoms.  No other aggravating or relieving factors.  She wants refill on Ativan, uses for occasional anxiety attack.  She is having consult soon with general surgery about taking her gall bladder out.  Since a year ago she has gotten intermittent flares of abdominal pain and has hx/o gall stones.   Has already had one consult to come up with diagnosis of gall stones and likely need for cholecystectomy.  No other complaint.  Past Medical History:  Diagnosis Date  . [facility]    [facility]  . Depression    Zoloft in the past  . H/O hiatal hernia   . Hx of ulcer disease   . Hx of varicella   . IUD (intrauterine device) in place 2014   [facility] [id number] [facility] [facility]  . NSVD (normal spontaneous vaginal delivery) 08/26/2012   Current Outpatient Medications on File Prior to Visit  Medication Sig Dispense Refill  . pantoprazole (PROTONIX) 40 MG tablet TAKE 1 TABLET BY MOUTH 45 MINS PRIOR TO BREAKFAST 90 tablet 1   No current facility-administered medications on file prior to visit.    Current Outpatient Medications on File Prior to Visit  Medication Sig Dispense Refill  . pantoprazole (PROTONIX) 40 MG tablet TAKE 1 TABLET BY MOUTH 45 MINS PRIOR TO BREAKFAST 90 tablet 1   No current facility-administered medications on file prior to visit.     ROS as in subjective  Objective: BP 122/80   Pulse 83   Wt 217 lb (98.4 kg)   SpO2 98%   BMI 41.00 kg/m   Gen: wd, wn, nad Right neck anterior triangle with 3cm x 1cm raised puffy tender area with underlying fullness and shoddy tender right sided  lymphadenitis.  Central to puffy raised area is 2mm small open wound, No induration, no fluctuance, no drainage.    Lungs clear Heart rrr, normal s1, s2, no murmurs Psych  - pleasant, good eye contact, answers questions appropriately Ext: no edema Pulses WNL    Assessment: Encounter Diagnoses  Name Primary?  . Insect bite, initial encounter Yes  . Anxiety   . Skin infection   . Gall stones   . Lymphadenitis     Plan: Skin infection , likely spider bite - begin Keflex, ibuprofen, warm compress.  If not much improved within 72 hours, call or return  Rx for Align given prior yeast infection on antibiotics  Anxiety - refilled Ativan for prn use  Gall stones - f/u with surgeon as planned   Sallye OberLouise was seen today for knot on neck.  Diagnoses and all orders for this visit:  Insect bite, initial encounter  Anxiety  Skin infection  Gall stones  Lymphadenitis  Other orders -     cephALEXin (KEFLEX) 500 MG capsule; Take 1 capsule (500 mg total) by mouth 3 (three) times daily. -     ibuprofen (ADVIL,MOTRIN) 600 MG tablet; Take 1 tablet (600 mg total) by mouth every 8 (eight) hours as needed. -     Probiotic Product (ALIGN) 4  MG CAPS; Take 1 capsule (4 mg total) by mouth daily. -     LORazepam (ATIVAN) 1 MG tablet; Take 1 tablet (1 mg total) by mouth at bedtime.

## 2017-04-29 NOTE — Telephone Encounter (Signed)
appt has been scheduled for Dr. Carolynne Edouardoth on 05/04/17

## 2017-05-04 ENCOUNTER — Ambulatory Visit: Payer: Self-pay | Admitting: General Surgery

## 2017-06-11 ENCOUNTER — Encounter (HOSPITAL_COMMUNITY)
Admission: EM | Disposition: A | Discharge status: Home / Self Care | Payer: Self-pay | Source: Home / Self Care | Attending: Emergency Medicine

## 2017-06-11 ENCOUNTER — Observation Stay (HOSPITAL_COMMUNITY): Payer: 59

## 2017-06-11 ENCOUNTER — Other Ambulatory Visit: Payer: Self-pay

## 2017-06-11 ENCOUNTER — Encounter (HOSPITAL_COMMUNITY): Payer: Self-pay | Admitting: Emergency Medicine

## 2017-06-11 ENCOUNTER — Observation Stay (HOSPITAL_COMMUNITY): Payer: 59 | Admitting: Certified Registered Nurse Anesthetist

## 2017-06-11 ENCOUNTER — Emergency Department (HOSPITAL_COMMUNITY): Payer: 59

## 2017-06-11 ENCOUNTER — Observation Stay (HOSPITAL_COMMUNITY)
Admission: EM | Admit: 2017-06-11 | Discharge: 2017-06-12 | Disposition: A | Discharge status: Home / Self Care | Payer: 59 | Attending: Surgery | Admitting: Surgery

## 2017-06-11 DIAGNOSIS — Z793 Long term (current) use of hormonal contraceptives: Secondary | ICD-10-CM | POA: Diagnosis not present

## 2017-06-11 DIAGNOSIS — K8020 Calculus of gallbladder without cholecystitis without obstruction: Principal | ICD-10-CM | POA: Diagnosis present

## 2017-06-11 DIAGNOSIS — F329 Major depressive disorder, single episode, unspecified: Secondary | ICD-10-CM | POA: Diagnosis not present

## 2017-06-11 DIAGNOSIS — Z87891 Personal history of nicotine dependence: Secondary | ICD-10-CM | POA: Diagnosis not present

## 2017-06-11 HISTORY — PX: CHOLECYSTECTOMY: SHX55

## 2017-06-11 LAB — COMPREHENSIVE METABOLIC PANEL
ALBUMIN: 3.5 g/dL (ref 3.5–5.0)
ALK PHOS: 47 U/L (ref 38–126)
ALT: 14 U/L (ref 14–54)
ANION GAP: 7 (ref 5–15)
AST: 19 U/L (ref 15–41)
BILIRUBIN TOTAL: 0.7 mg/dL (ref 0.3–1.2)
BUN: 10 mg/dL (ref 6–20)
CALCIUM: 8.8 mg/dL — AB (ref 8.9–10.3)
CO2: 24 mmol/L (ref 22–32)
Chloride: 104 mmol/L (ref 101–111)
Creatinine, Ser: 0.81 mg/dL (ref 0.44–1.00)
GFR calc Af Amer: >60 mL/min (ref >60)
GFR calc non Af Amer: >60 mL/min (ref >60)
GLUCOSE: 104 mg/dL — AB (ref 65–99)
Potassium: 4.4 mmol/L (ref 3.5–5.1)
SODIUM: 135 mmol/L (ref 135–145)
Total Protein: 7 g/dL (ref 6.5–8.1)

## 2017-06-11 LAB — CBC
HEMATOCRIT: 37.7 % (ref 36.0–46.0)
HEMATOCRIT: 39.6 % (ref 36.0–46.0)
Hemoglobin: 12.1 g/dL (ref 12.0–15.0)
Hemoglobin: 12.6 g/dL (ref 12.0–15.0)
MCH: 30.4 pg (ref 26.0–34.0)
MCH: 30.1 pg (ref 26.0–34.0)
MCHC: 32.1 g/dL (ref 30.0–36.0)
MCHC: 31.8 g/dL (ref 30.0–36.0)
MCV: 94.7 fL (ref 78.0–100.0)
MCV: 94.7 fL (ref 78.0–100.0)
Platelets: 348 10*3/uL (ref 150–400)
Platelets: 349 10*3/uL (ref 150–400)
RBC: 3.98 MIL/uL (ref 3.87–5.11)
RBC: 4.18 MIL/uL (ref 3.87–5.11)
RDW: 12.9 % (ref 11.5–15.5)
RDW: 12.9 % (ref 11.5–15.5)
WBC: 7.9 10*3/uL (ref 4.0–10.5)
WBC: 7.4 10*3/uL (ref 4.0–10.5)

## 2017-06-11 LAB — URINALYSIS, ROUTINE W REFLEX MICROSCOPIC
BACTERIA UA: NONE SEEN
Bilirubin Urine: NEGATIVE
Glucose, UA: NEGATIVE mg/dL
Ketones, ur: NEGATIVE mg/dL
Leukocytes, UA: NEGATIVE
Nitrite: NEGATIVE
PH: 5 (ref 5.0–8.0)
Protein, ur: NEGATIVE mg/dL
SPECIFIC GRAVITY, URINE: 1.011 (ref 1.005–1.030)

## 2017-06-11 LAB — CREATININE, SERUM
Creatinine, Ser: 0.77 mg/dL (ref 0.44–1.00)
GFR calc non Af Amer: >60 mL/min (ref >60)

## 2017-06-11 LAB — LIPASE, BLOOD: Lipase: 31 U/L (ref 11–51)

## 2017-06-11 LAB — I-STAT BETA HCG BLOOD, ED (MC, WL, AP ONLY)

## 2017-06-11 LAB — I-STAT TROPONIN, ED: Troponin i, poc: 0 ng/mL (ref 0.00–0.08)

## 2017-06-11 SURGERY — LAPAROSCOPIC CHOLECYSTECTOMY WITH INTRAOPERATIVE CHOLANGIOGRAM
Anesthesia: General

## 2017-06-11 MED ORDER — FENTANYL CITRATE (PF) 100 MCG/2ML IJ SOLN
25.0000 ug | INTRAMUSCULAR | Status: DC | PRN
  Administered 2017-06-11 (×4): 25 ug via INTRAVENOUS

## 2017-06-11 MED ORDER — CHLORHEXIDINE GLUCONATE CLOTH 2 % EX PADS: 6.0000 | MEDICATED_PAD | Freq: Once | CUTANEOUS | Status: DC

## 2017-06-11 MED ORDER — ONDANSETRON 4 MG PO TBDP
4.0000 mg | ORAL_TABLET | Freq: Once | ORAL | Status: AC
  Administered 2017-06-11: 4 mg via ORAL
  Filled 2017-06-11: qty 1

## 2017-06-11 MED ORDER — OXYCODONE HCL 5 MG/5ML PO SOLN: 5.0000 mg | Freq: Once | ORAL | Status: DC | PRN

## 2017-06-11 MED ORDER — CELECOXIB 200 MG PO CAPS
200.0000 mg | ORAL_CAPSULE | ORAL | Status: AC
  Administered 2017-06-11: 200 mg via ORAL

## 2017-06-11 MED ORDER — DEXAMETHASONE SODIUM PHOSPHATE 10 MG/ML IJ SOLN
INTRAMUSCULAR | Status: AC
  Filled 2017-06-11: qty 1

## 2017-06-11 MED ORDER — LORAZEPAM 1 MG PO TABS: 1.0000 mg | ORAL_TABLET | Freq: Every day | ORAL | Status: DC

## 2017-06-11 MED ORDER — MORPHINE SULFATE (PF) 4 MG/ML IV SOLN: 1.0000 mg | INTRAVENOUS | Status: DC | PRN

## 2017-06-11 MED ORDER — CEFTRIAXONE SODIUM 2 G IJ SOLR: 2.0000 g | INTRAMUSCULAR | Status: DC

## 2017-06-11 MED ORDER — TRAMADOL HCL 50 MG PO TABS
50.0000 mg | ORAL_TABLET | Freq: Four times a day (QID) | ORAL | Status: DC | PRN
  Administered 2017-06-12: 50 mg via ORAL
  Filled 2017-06-11: qty 1

## 2017-06-11 MED ORDER — OXYCODONE HCL 5 MG PO TABS
5.0000 mg | ORAL_TABLET | Freq: Once | ORAL | Status: DC | PRN
Start: 2017-06-11 — End: 2017-06-11

## 2017-06-11 MED ORDER — ONDANSETRON 4 MG PO TBDP: 4.0000 mg | ORAL_TABLET | Freq: Four times a day (QID) | ORAL | Status: DC | PRN

## 2017-06-11 MED ORDER — GI COCKTAIL ~~LOC~~
30.0000 mL | Freq: Once | ORAL | Status: AC
  Administered 2017-06-11: 30 mL via ORAL
  Filled 2017-06-11: qty 30

## 2017-06-11 MED ORDER — HYDROMORPHONE HCL 1 MG/ML IJ SOLN
1.0000 mg | INTRAMUSCULAR | Status: DC | PRN
  Administered 2017-06-11: 1 mg via INTRAVENOUS
  Filled 2017-06-11: qty 1

## 2017-06-11 MED ORDER — ACETAMINOPHEN 325 MG PO TABS
650.0000 mg | ORAL_TABLET | Freq: Four times a day (QID) | ORAL | Status: DC | PRN
  Filled 2017-06-11: qty 2

## 2017-06-11 MED ORDER — OXYCODONE HCL 5 MG PO TABS: 5.0000 mg | ORAL_TABLET | ORAL | Status: DC | PRN

## 2017-06-11 MED ORDER — ONDANSETRON HCL 4 MG/2ML IJ SOLN: 4.0000 mg | Freq: Once | INTRAMUSCULAR | Status: DC | PRN

## 2017-06-11 MED ORDER — DEXAMETHASONE SODIUM PHOSPHATE 10 MG/ML IJ SOLN
INTRAMUSCULAR | Status: DC | PRN
  Administered 2017-06-11: 10 mg via INTRAVENOUS

## 2017-06-11 MED ORDER — PHENYLEPHRINE HCL 10 MG/ML IJ SOLN
INTRAMUSCULAR | Status: DC | PRN
  Administered 2017-06-11: 80 ug via INTRAVENOUS

## 2017-06-11 MED ORDER — SODIUM CHLORIDE 0.9 % IR SOLN
Status: DC | PRN
  Administered 2017-06-11: 1000 mL

## 2017-06-11 MED ORDER — OXYCODONE-ACETAMINOPHEN 5-325 MG PO TABS
1.0000 | ORAL_TABLET | ORAL | Status: DC | PRN
  Administered 2017-06-11: 1 via ORAL
  Filled 2017-06-11: qty 1

## 2017-06-11 MED ORDER — SODIUM CHLORIDE 0.9 % IV SOLN
INTRAVENOUS | Status: DC
  Administered 2017-06-12: 06:00:00 via INTRAVENOUS

## 2017-06-11 MED ORDER — ENOXAPARIN SODIUM 40 MG/0.4ML ~~LOC~~ SOLN: 40.0000 mg | SUBCUTANEOUS | Status: DC

## 2017-06-11 MED ORDER — SODIUM CHLORIDE 0.45 % IV SOLN: INTRAVENOUS | Status: DC

## 2017-06-11 MED ORDER — ACETAMINOPHEN 650 MG RE SUPP: 650.0000 mg | Freq: Four times a day (QID) | RECTAL | Status: DC | PRN

## 2017-06-11 MED ORDER — ONDANSETRON HCL 4 MG/2ML IJ SOLN
INTRAMUSCULAR | Status: DC | PRN
  Administered 2017-06-11: 4 mg via INTRAVENOUS

## 2017-06-11 MED ORDER — LIDOCAINE HCL (CARDIAC) 20 MG/ML IV SOLN
INTRAVENOUS | Status: DC | PRN
  Administered 2017-06-11: 60 mg via INTRATRACHEAL

## 2017-06-11 MED ORDER — CELECOXIB 200 MG PO CAPS
ORAL_CAPSULE | ORAL | Status: AC
Start: 2017-06-11 — End: 2017-06-11
  Administered 2017-06-11: 200 mg via ORAL
  Filled 2017-06-11: qty 1

## 2017-06-11 MED ORDER — FENTANYL CITRATE (PF) 100 MCG/2ML IJ SOLN
INTRAMUSCULAR | Status: AC
  Filled 2017-06-11: qty 2

## 2017-06-11 MED ORDER — PROPOFOL 10 MG/ML IV BOLUS
INTRAVENOUS | Status: AC
  Filled 2017-06-11: qty 40

## 2017-06-11 MED ORDER — GABAPENTIN 300 MG PO CAPS
ORAL_CAPSULE | ORAL | Status: AC
  Administered 2017-06-11: 300 mg via ORAL
  Filled 2017-06-11: qty 1

## 2017-06-11 MED ORDER — IOPAMIDOL (ISOVUE-300) INJECTION 61%
INTRAVENOUS | Status: AC
  Filled 2017-06-11: qty 50

## 2017-06-11 MED ORDER — DIPHENHYDRAMINE HCL 50 MG/ML IJ SOLN: 25.0000 mg | Freq: Four times a day (QID) | INTRAMUSCULAR | Status: DC | PRN

## 2017-06-11 MED ORDER — PANTOPRAZOLE SODIUM 40 MG IV SOLR: 40.0000 mg | Freq: Every day | INTRAVENOUS | Status: DC

## 2017-06-11 MED ORDER — KETOROLAC TROMETHAMINE 30 MG/ML IJ SOLN
INTRAMUSCULAR | Status: DC | PRN
  Administered 2017-06-11: 30 mg via INTRAVENOUS

## 2017-06-11 MED ORDER — ONDANSETRON HCL 4 MG/2ML IJ SOLN: 4.0000 mg | Freq: Four times a day (QID) | INTRAMUSCULAR | Status: DC | PRN

## 2017-06-11 MED ORDER — ACETAMINOPHEN 500 MG PO TABS
1000.0000 mg | ORAL_TABLET | ORAL | Status: AC
  Administered 2017-06-11: 1000 mg via ORAL

## 2017-06-11 MED ORDER — FENTANYL CITRATE (PF) 250 MCG/5ML IJ SOLN
INTRAMUSCULAR | Status: DC | PRN
  Administered 2017-06-11 (×2): 100 ug via INTRAVENOUS

## 2017-06-11 MED ORDER — PANTOPRAZOLE SODIUM 40 MG PO TBEC: 40.0000 mg | DELAYED_RELEASE_TABLET | Freq: Every day | ORAL | Status: DC

## 2017-06-11 MED ORDER — CEFAZOLIN SODIUM-DEXTROSE 2-4 GM/100ML-% IV SOLN
2.0000 g | INTRAVENOUS | Status: AC
  Administered 2017-06-11: 2 g via INTRAVENOUS
  Filled 2017-06-11: qty 100

## 2017-06-11 MED ORDER — ACETAMINOPHEN 325 MG PO TABS: 650.0000 mg | ORAL_TABLET | Freq: Four times a day (QID) | ORAL | Status: DC | PRN

## 2017-06-11 MED ORDER — BUPIVACAINE-EPINEPHRINE 0.25% -1:200000 IJ SOLN
INTRAMUSCULAR | Status: DC | PRN
  Administered 2017-06-11: 9 mL

## 2017-06-11 MED ORDER — METHOCARBAMOL 500 MG PO TABS: 500.0000 mg | ORAL_TABLET | Freq: Three times a day (TID) | ORAL | Status: DC | PRN

## 2017-06-11 MED ORDER — HYDRALAZINE HCL 20 MG/ML IJ SOLN: 10.0000 mg | INTRAMUSCULAR | Status: DC | PRN

## 2017-06-11 MED ORDER — OXYCODONE HCL 5 MG PO TABS
5.0000 mg | ORAL_TABLET | ORAL | Status: DC | PRN
  Administered 2017-06-12 (×2): 10 mg via ORAL
  Filled 2017-06-11 (×2): qty 2

## 2017-06-11 MED ORDER — ROCURONIUM BROMIDE 100 MG/10ML IV SOLN
INTRAVENOUS | Status: DC | PRN
  Administered 2017-06-11: 60 mg via INTRAVENOUS

## 2017-06-11 MED ORDER — ONDANSETRON HCL 4 MG/2ML IJ SOLN
INTRAMUSCULAR | Status: AC
  Filled 2017-06-11: qty 2

## 2017-06-11 MED ORDER — MIDAZOLAM HCL 2 MG/2ML IJ SOLN
INTRAMUSCULAR | Status: AC
  Filled 2017-06-11: qty 2

## 2017-06-11 MED ORDER — FENTANYL CITRATE (PF) 250 MCG/5ML IJ SOLN
INTRAMUSCULAR | Status: AC
  Filled 2017-06-11: qty 5

## 2017-06-11 MED ORDER — PROPOFOL 10 MG/ML IV BOLUS
INTRAVENOUS | Status: DC | PRN
  Administered 2017-06-11: 150 mg via INTRAVENOUS

## 2017-06-11 MED ORDER — MELATONIN 5 MG PO TABS: 5.0000 mg | ORAL_TABLET | Freq: Every day | ORAL | Status: DC

## 2017-06-11 MED ORDER — DIPHENHYDRAMINE HCL 25 MG PO CAPS: 25.0000 mg | ORAL_CAPSULE | Freq: Four times a day (QID) | ORAL | Status: DC | PRN

## 2017-06-11 MED ORDER — MIDAZOLAM HCL 5 MG/5ML IJ SOLN
INTRAMUSCULAR | Status: DC | PRN
  Administered 2017-06-11: 2 mg via INTRAVENOUS

## 2017-06-11 MED ORDER — SUGAMMADEX SODIUM 200 MG/2ML IV SOLN
INTRAVENOUS | Status: DC | PRN
  Administered 2017-06-11: 300 mg via INTRAVENOUS

## 2017-06-11 MED ORDER — GABAPENTIN 300 MG PO CAPS
300.0000 mg | ORAL_CAPSULE | ORAL | Status: AC
  Administered 2017-06-11: 300 mg via ORAL

## 2017-06-11 MED ORDER — SIMETHICONE 80 MG PO CHEW
40.0000 mg | CHEWABLE_TABLET | Freq: Four times a day (QID) | ORAL | Status: DC | PRN
  Filled 2017-06-11: qty 1

## 2017-06-11 MED ORDER — IOPAMIDOL (ISOVUE-300) INJECTION 61%
INTRAVENOUS | Status: DC | PRN
  Administered 2017-06-11: 5 mL

## 2017-06-11 MED ORDER — CEFAZOLIN SODIUM-DEXTROSE 2-4 GM/100ML-% IV SOLN
INTRAVENOUS | Status: AC
  Filled 2017-06-11: qty 100

## 2017-06-11 MED ORDER — ONDANSETRON HCL 4 MG/2ML IJ SOLN
4.0000 mg | Freq: Four times a day (QID) | INTRAMUSCULAR | Status: DC | PRN
  Administered 2017-06-11: 4 mg via INTRAVENOUS
  Filled 2017-06-11: qty 2

## 2017-06-11 MED ORDER — LACTATED RINGERS IV SOLN
INTRAVENOUS | Status: DC
  Administered 2017-06-11 (×2): via INTRAVENOUS

## 2017-06-11 MED ORDER — ONDANSETRON 4 MG PO TBDP
4.0000 mg | ORAL_TABLET | Freq: Once | ORAL | Status: AC | PRN
  Administered 2017-06-11: 4 mg via ORAL
  Filled 2017-06-11: qty 1

## 2017-06-11 MED ORDER — 0.9 % SODIUM CHLORIDE (POUR BTL) OPTIME
TOPICAL | Status: DC | PRN
  Administered 2017-06-11: 1000 mL

## 2017-06-11 SURGICAL SUPPLY — 43 items
APPLIER CLIP ROT 10 11.4 M/L (STAPLE) ×2
BENZOIN TINCTURE PRP APPL 2/3 (GAUZE/BANDAGES/DRESSINGS) ×2 IMPLANT
BLADE CLIPPER SURG (BLADE) IMPLANT
CANISTER SUCT 3000ML PPV (MISCELLANEOUS) ×2 IMPLANT
CHLORAPREP W/TINT 26ML (MISCELLANEOUS) ×2 IMPLANT
CLIP APPLIE ROT 10 11.4 M/L (STAPLE) ×1 IMPLANT
CONT SPEC 4OZ CLIKSEAL STRL BL (MISCELLANEOUS) ×2 IMPLANT
COVER MAYO STAND STRL (DRAPES) ×2 IMPLANT
COVER SURGICAL LIGHT HANDLE (MISCELLANEOUS) ×2 IMPLANT
DERMABOND ADHESIVE PROPEN (GAUZE/BANDAGES/DRESSINGS) ×1
DERMABOND ADVANCED .7 DNX6 (GAUZE/BANDAGES/DRESSINGS) ×1 IMPLANT
DRAPE C-ARM 42X72 X-RAY (DRAPES) ×2 IMPLANT
DRSG TEGADERM 2-3/8X2-3/4 SM (GAUZE/BANDAGES/DRESSINGS) ×6 IMPLANT
DRSG TEGADERM 4X4.75 (GAUZE/BANDAGES/DRESSINGS) ×2 IMPLANT
ELECT REM PT RETURN 9FT ADLT (ELECTROSURGICAL) ×2
ELECTRODE REM PT RTRN 9FT ADLT (ELECTROSURGICAL) ×1 IMPLANT
FILTER SMOKE EVAC LAPAROSHD (FILTER) ×2 IMPLANT
GAUZE SPONGE 2X2 8PLY STRL LF (GAUZE/BANDAGES/DRESSINGS) ×1 IMPLANT
GLOVE BIO SURGEON STRL SZ7 (GLOVE) ×2 IMPLANT
GLOVE BIOGEL PI IND STRL 7.5 (GLOVE) ×1 IMPLANT
GLOVE BIOGEL PI INDICATOR 7.5 (GLOVE) ×1
GOWN STRL REUS W/ TWL LRG LVL3 (GOWN DISPOSABLE) ×3 IMPLANT
GOWN STRL REUS W/TWL LRG LVL3 (GOWN DISPOSABLE) ×3
KIT BASIN OR (CUSTOM PROCEDURE TRAY) ×2 IMPLANT
KIT ROOM TURNOVER OR (KITS) ×2 IMPLANT
NS IRRIG 1000ML POUR BTL (IV SOLUTION) ×2 IMPLANT
PAD ARMBOARD 7.5X6 YLW CONV (MISCELLANEOUS) ×2 IMPLANT
POUCH SPECIMEN RETRIEVAL 10MM (ENDOMECHANICALS) ×2 IMPLANT
SCISSORS LAP 5X35 DISP (ENDOMECHANICALS) ×2 IMPLANT
SET CHOLANGIOGRAPH 5 50 .035 (SET/KITS/TRAYS/PACK) ×2 IMPLANT
SET IRRIG TUBING LAPAROSCOPIC (IRRIGATION / IRRIGATOR) ×2 IMPLANT
SLEEVE ENDOPATH XCEL 5M (ENDOMECHANICALS) ×2 IMPLANT
SPECIMEN JAR SMALL (MISCELLANEOUS) ×2 IMPLANT
SPONGE GAUZE 2X2 STER 10/PKG (GAUZE/BANDAGES/DRESSINGS) ×1
STRIP CLOSURE SKIN 1/2X4 (GAUZE/BANDAGES/DRESSINGS) ×2 IMPLANT
SUT MNCRL AB 4-0 PS2 18 (SUTURE) ×2 IMPLANT
TOWEL OR 17X26 10 PK STRL BLUE (TOWEL DISPOSABLE) ×2 IMPLANT
TRAY LAPAROSCOPIC MC (CUSTOM PROCEDURE TRAY) ×2 IMPLANT
TROCAR XCEL BLUNT TIP 100MML (ENDOMECHANICALS) ×2 IMPLANT
TROCAR XCEL NON-BLD 11X100MML (ENDOMECHANICALS) ×2 IMPLANT
TROCAR XCEL NON-BLD 5MMX100MML (ENDOMECHANICALS) ×2 IMPLANT
TUBING INSUFFLATION (TUBING) ×2 IMPLANT
WATER STERILE IRR 1000ML POUR (IV SOLUTION) ×2 IMPLANT

## 2017-06-11 NOTE — Anesthesia Postprocedure Evaluation (Signed)
Anesthesia Post Note  Patient: Mary Gibbs  Procedure(s) Performed: LAPAROSCOPIC CHOLECYSTECTOMY WITH INTRAOPERATIVE CHOLANGIOGRAM (N/A )     Patient location during evaluation: PACU Anesthesia Type: General Level of consciousness: awake and alert Pain management: pain level controlled Vital Signs Assessment: post-procedure vital signs reviewed and stable Respiratory status: spontaneous breathing, nonlabored ventilation, respiratory function stable and patient connected to nasal cannula oxygen Cardiovascular status: blood pressure returned to baseline and stable Postop Assessment: no apparent nausea or vomiting Anesthetic complications: no    Last Vitals:  Vitals:   06/11/17 1841 06/11/17 2051  BP: 117/65 106/64  Pulse: 81 87  Resp: 16 18  Temp: 37.1 C 36.6 C  SpO2: 100% 97%    Last Pain:  Vitals:   06/11/17 2051  TempSrc: Oral  PainSc:                  Jerron Niblack COKER

## 2017-06-11 NOTE — ED Triage Notes (Signed)
Pt presents with worsening gallbladder pain; pt states she was seen by surgeon with appt to have GB removed in 1 month however pain is becoming unbearable; pt states pain radiates to between the scapulae

## 2017-06-11 NOTE — ED Provider Notes (Signed)
[name] Provider Note   CSN: [id number] Arrival date & time: 06/11/17  [id number]     History   Chief Complaint Chief Complaint  Patient presents with  . Abdominal Pain    HPI Mary Gibbs is a 32 y.o. female.  HPI  32 year old African-American female with history of PUD, hiatal hernia, symptomatic cholelithiasis who presents to the emergency department today with complaints of right upper quadrant abdominal pain that radiates to the epigastric region along with nausea, vomiting and diarrhea.  Patient also reports some chest burning sensation.  Patient states the pain radiates to her back.  She states that the pain has been ongoing for several months when she was diagnosed with cholelithiasis.  The patient states that she has an elective cholecystectomy on 07/09/17 scheduled.  Patient states that she was able to control the pain at home with ibuprofen and presents the ED for evaluation.  The patient states that she ate yesterday afternoon a burrito bowl from [employer].  She states that the symptoms started last night and have progressively worsened.  Patient states the pain is worse with p.o. intake.  Nothing makes better.  She describes the pain as a 10 out of 10.  DVT/PE, prolonged immobilization or recent hospitalization/surgeries, unilateral calf tenderness, oral contraceptive use, tobacco use.  She denies any cardiac history.  Patient does report passing flatulence.  Pt denies any fever, chill, ha, vision changes, lightheadedness, dizziness, congestion, neck pain,  sob, cough,  urinary symptoms,  melena, hematochezia, lower extremity paresthesias.   Past Medical History:  Diagnosis Date  . [facility]    [facility]  . Depression    Zoloft in the past  . H/O hiatal hernia   . Hx of ulcer disease   . Hx of varicella   . IUD (intrauterine device) in place 2014   [facility] [address] [facility]  . NSVD (normal spontaneous vaginal delivery)  08/26/2012    Patient Active Problem List   Diagnosis Date Noted  . Symptomatic cholelithiasis 06/11/2017  . Anemia, iron deficiency 08/28/2015  . Encounter for health maintenance examination in adult 08/28/2015  . Pain of upper abdomen 08/28/2015  . Influenza vaccination declined 08/28/2015  . NSVD (normal spontaneous vaginal delivery) 08/26/2012  . Perineal laceration with delivery, second degree 08/26/2012  . Obesity 06/14/2012    Past Surgical History:  Procedure Laterality Date  . TONSILLECTOMY     As a baby    OB History    Gravida Para Term Preterm AB Living   3 2 2   1 2    SAB TAB Ectopic Multiple Live Births     1     2       Home Medications    Prior to Admission medications   Medication Sig Start Date End Date Taking? Authorizing Provider  BLACK CURRANT SEED OIL PO Take 1 capsule by mouth daily.   Yes [provider]  ibuprofen (ADVIL,MOTRIN) 600 MG tablet Take 1 tablet (600 mg total) by mouth every 8 (eight) hours as needed. 04/28/17  Yes Tysinger, Kermit Baloavid S, PA-C  levonorgestrel (MIRENA) 20 MCG/24HR IUD 1 each by Intrauterine route once.   Yes [provider]  LORazepam (ATIVAN) 1 MG tablet Take 1 tablet (1 mg total) by mouth at bedtime. 04/28/17  Yes Tysinger, Kermit Baloavid S, PA-C  Melatonin 5 MG TABS Take 5 mg by mouth at bedtime.   Yes [provider]  pantoprazole (PROTONIX) 40 MG tablet  TAKE 1 TABLET BY MOUTH 45 MINS PRIOR TO BREAKFAST 01/13/17  Yes Tysinger, Kermit Balo, PA-C  Probiotic Product (ALIGN) 4 MG CAPS Take 1 capsule (4 mg total) by mouth daily. 04/28/17  Yes Tysinger, Kermit Balo, PA-C    Family History Family History  Problem Relation Age of Onset  . Epilepsy Sister   . Depression Sister   . Bipolar disorder Sister   . Hypertension Mother   . Tuberculosis Mother        as a child  . Diabetes Mother        diet controlled  . Depression Mother   . Colon cancer Paternal Grandfather   . Cancer Paternal Grandfather         prostate  . Breast cancer Maternal Aunt        x 3;after menopause  . Diabetes Maternal Grandmother   . Hypertension Maternal Grandmother   . Stomach cancer Paternal Grandmother   . Heart disease Neg Hx     Social History Social History   Tobacco Use  . Smoking status: Former Smoker    Packs/day: 0.50    Years: 4.00    Pack years: 2.00    Types: Cigarettes    Last attempt to quit: 04/21/2016    Years since quitting: 1.1  . Smokeless tobacco: Never Used  Substance Use Topics  . Alcohol use: Yes    Comment: occasionally  . Drug use: No     Allergies   Patient has no known allergies.   Review of Systems Review of Systems  Constitutional: Negative for chills and fever.  HENT: Negative for congestion.   Eyes: Negative for visual disturbance.  Respiratory: Negative for cough, shortness of breath and wheezing.   Cardiovascular: Positive for chest pain (burning). Negative for palpitations and leg swelling.  Gastrointestinal: Positive for abdominal pain, diarrhea, nausea and vomiting.  Genitourinary: Negative for dysuria, flank pain, frequency, hematuria, urgency, vaginal bleeding and vaginal discharge.  Musculoskeletal: Positive for back pain. Negative for arthralgias and myalgias.  Skin: Negative for rash.  Neurological: Negative for dizziness, syncope, weakness, light-headedness, numbness and headaches.  Psychiatric/Behavioral: Negative for sleep disturbance. The patient is not nervous/anxious.      Physical Exam Updated Vital Signs BP 109/69   Pulse 74   Temp 98.3 F (36.8 C) (Oral)   Resp 15   Ht 5\' 1"  (1.549 m)   Wt 98.4 kg (217 lb)   SpO2 100%   BMI 41.00 kg/m   Physical Exam  Constitutional: She is oriented to person, place, and time. She appears well-developed and well-nourished.  Non-toxic appearance. No distress.  HENT:  Head: Normocephalic and atraumatic.  Nose: Nose normal.  Mouth/Throat: Oropharynx is clear and moist.  Eyes: Conjunctivae are  normal. Pupils are equal, round, and reactive to light. Right eye exhibits no discharge. Left eye exhibits no discharge.  Neck: Normal range of motion. Neck supple. No JVD present. No tracheal deviation present.  Cardiovascular: Normal rate, regular rhythm, normal heart sounds and intact distal pulses. Exam reveals no gallop and no friction rub.  No murmur heard. Pulmonary/Chest: Effort normal and breath sounds normal. No stridor. No respiratory distress. She has no wheezes. She has no rhonchi. She has no rales. She exhibits no tenderness.  No hypoxia or tachypnea.  Abdominal: Soft. She exhibits no distension. Bowel sounds are increased. There is tenderness in the right upper quadrant. There is positive Murphy's sign. There is no rigidity, no rebound, no guarding, no CVA tenderness and no  tenderness at McBurney's point.  Musculoskeletal: Normal range of motion.  No lower extremity edema or calf tenderness.  Lymphadenopathy:    She has no cervical adenopathy.  Neurological: She is alert and oriented to person, place, and time.  Skin: Skin is warm and dry. Capillary refill takes less than 2 seconds. She is not diaphoretic.  Psychiatric: Her behavior is normal. Judgment and thought content normal.  Nursing note and vitals reviewed.    ED Treatments / Results  Labs (all labs ordered are listed, but only abnormal results are displayed) Labs Reviewed  COMPREHENSIVE METABOLIC PANEL - Abnormal; Notable for the following components:      Result Value   Glucose, Bld 104 (*)    Calcium 8.8 (*)    All other components within normal limits  URINALYSIS, ROUTINE W REFLEX MICROSCOPIC - Abnormal; Notable for the following components:   Hgb urine dipstick SMALL (*)    Squamous Epithelial / LPF 0-5 (*)    All other components within normal limits  LIPASE, BLOOD  CBC  HIV ANTIBODY (ROUTINE TESTING)  CBC  CREATININE, SERUM  I-STAT BETA HCG BLOOD, ED ([facility], [facility], [facility] ONLY)  I-STAT TROPONIN, ED    EKG   EKG Interpretation None       Radiology Dg Chest 2 View  Result Date: 06/11/2017 CLINICAL DATA:  Chest tightness and epigastric pain. EXAM: CHEST  2 VIEW COMPARISON:  Chest x-ray dated November 27, 2015. FINDINGS: The cardiomediastinal silhouette is normal in size. Normal pulmonary vascularity. Minimal right basilar atelectasis. No focal consolidation, pleural effusion, or pneumothorax. No acute osseous abnormality. IMPRESSION: No active cardiopulmonary disease. Electronically Signed   By: Obie Dredge M.D.   On: 06/11/2017 08:22   US Abdomen Limited Ruq  Result Date: 06/11/2017 CLINICAL DATA:  Abdominal pain.  History of gallstones EXAM: ULTRASOUND ABDOMEN LIMITED RIGHT UPPER QUADRANT COMPARISON:  06/20/2016 FINDINGS: Gallbladder: Numerous small gallstones filling the gallbladder, 8 mm or less in size. No wall thickening. Negative sonographic Murphy's. Common bile duct: Diameter: Normal caliber, 3 mm Liver: No focal lesion identified. Within normal limits in parenchymal echogenicity. Portal vein is patent on color Doppler imaging with normal direction of blood flow towards the liver. IMPRESSION: Numerous small stones fill the gallbladder. No evidence of acute cholecystitis. Electronically Signed   By: Charlett Nose M.D.   On: 06/11/2017 08:58    Procedures Procedures (including critical care time)  Medications Ordered in ED Medications  LORazepam (ATIVAN) tablet 1 mg (not administered)  Melatonin TABS 5 mg (not administered)  enoxaparin (LOVENOX) injection 40 mg (not administered)  0.45 % sodium chloride infusion (not administered)  cefTRIAXone (ROCEPHIN) 2 g in dextrose 5 % 50 mL IVPB (not administered)  hydrALAZINE (APRESOLINE) injection 10 mg (not administered)  pantoprazole (PROTONIX) injection 40 mg (not administered)  simethicone (MYLICON) chewable tablet 40 mg (not administered)  ondansetron (ZOFRAN-ODT) disintegrating tablet 4 mg (not administered)    Or  ondansetron (ZOFRAN)  injection 4 mg (not administered)  diphenhydrAMINE (BENADRYL) capsule 25 mg (not administered)    Or  diphenhydrAMINE (BENADRYL) injection 25 mg (not administered)  methocarbamol (ROBAXIN) tablet 500 mg (not administered)  morphine 4 MG/ML injection 1-2 mg (not administered)  oxyCODONE (Oxy IR/ROXICODONE) immediate release tablet 5-10 mg (not administered)  acetaminophen (TYLENOL) tablet 650 mg (not administered)    Or  acetaminophen (TYLENOL) suppository 650 mg (not administered)  ondansetron (ZOFRAN-ODT) disintegrating tablet 4 mg (4 mg Oral Given 06/11/17 0627)  gi cocktail (Maalox,Lidocaine,Donnatal) (30 mLs Oral Given  06/11/17 0859)  ondansetron (ZOFRAN-ODT) disintegrating tablet 4 mg (4 mg Oral Given 06/11/17 1610)     Initial Impression / Assessment and Plan / ED Course  I have reviewed the triage vital signs and the nursing notes.  Pertinent labs & imaging results that were available during my care of the patient were reviewed by me and considered in my medical decision making (see chart for details).     Presents to the ED for evaluation of right upper quadrant abdominal pain that radiates to the back with associated nausea, vomiting, diarrhea with burning sensation in her chest.  Patient has known cholelithiasis and has no cholecystectomy scheduled for next month.  She states that she was unable to manage her pain at home this morning which is why she came to the ED for evaluation.  Patient was concerned that she may have an infection.  Patient is overall well-appearing and nontoxic.  She does have some discomfort with right upper quadrant palpation and positive Murphy sign.  Patient has no signs of peritonitis.  Lungs clear to auscultation bilaterally.  Heart regular rate and rhythm.  Patient is afebrile with no tachycardia, hypotension or hypoxia noted.  Lab work was reassuring.  No leukocytosis.  Electrolytes are reassuring without any signs of significant dehydration.  UA shows  no signs of infection.  Normal lipase.  Liver functions were normal.  Given patient's chest discomfort troponin was ordered which was normal.  EKG showed normal sinus rhythm.  Chest x-ray was unremarkable.  Ultrasound of the right upper quadrant revealed multiple gallstones without any signs of cholecystitis.  The patient's symptoms seem consistent with biliary colic from cholelithiasis versus viral gastritis versus PUD.  Patient was given a GI cocktail and Zofran in the ED.  States that her pain has completely resolved.  Patient's presentation does not seem consistent with ACS, PE, dissection, pneumonia.  Patient has no lower abdominal pain to palpation.  Low suspicion for appendicitis, diverticulitis, PID, ectopic pregnancy, obstruction.  Patient ongoing pain associated with her cholelithiasis will consult general surgery for further recommendations.  General surgery evaluated patient and felt patient needed intraoperative cholecystitis given ongoing pain.  Patient was taken to the OR later today for cholecystectomy.  They will admit to MedSurg and have admission orders placed.    Final Clinical Impressions(s) / ED Diagnoses   Final diagnoses:  Cholelithiasis    ED Discharge Orders    None       Wallace Keller 06/11/17 1148    Gwyneth Sprout, MD 06/11/17 2157

## 2017-06-11 NOTE — Op Note (Signed)
Laparoscopic Cholecystectomy with IOC Procedure Note  Indications: This patient presents with symptomatic gallbladder disease and will undergo laparoscopic cholecystectomy.  Her symptoms are becoming worse and she has presented to the ED for treatment.  Pre-operative Diagnosis: Calculus of gallbladder with other cholecystitis, without mention of obstruction  Post-operative Diagnosis: Same  Surgeon: Wynona LunaMatthew K Candelaria Pies   Assistants: none  Anesthesia: General endotracheal anesthesia  ASA Class: 1  Procedure Details  The patient was seen again in the Holding Room. The risks, benefits, complications, treatment options, and expected outcomes were discussed with the patient. The possibilities of reaction to medication, pulmonary aspiration, perforation of viscus, bleeding, recurrent infection, finding a normal gallbladder, the need for additional procedures, failure to diagnose a condition, the possible need to convert to an open procedure, and creating a complication requiring transfusion or operation were discussed with the patient. The likelihood of improving the patient's symptoms with return to their baseline status is good.  The patient and/or family concurred with the proposed plan, giving informed consent. The site of surgery properly noted. The patient was taken to Operating Room, identified as Mary Gibbs and the procedure verified as Laparoscopic Cholecystectomy with Intraoperative Cholangiogram. A Time Out was held and the above information confirmed.  Prior to the induction of general anesthesia, antibiotic prophylaxis was administered. General endotracheal anesthesia was then administered and tolerated well. After the induction, the abdomen was prepped with Chloraprep and draped in the sterile fashion. The patient was positioned in the supine position.  Local anesthetic agent was injected into the skin near the umbilicus and an incision made. We dissected down to the abdominal fascia with  blunt dissection.  The fascia was incised vertically and we entered the peritoneal cavity bluntly.  A pursestring suture of 0-Vicryl was placed around the fascial opening.  The Hasson cannula was inserted and secured with the stay suture.  Pneumoperitoneum was then created with CO2 and tolerated well without any adverse changes in the patient's vital signs. An 11-mm port was placed in the subxiphoid position.  Two 5-mm ports were placed in the right upper quadrant. All skin incisions were infiltrated with a local anesthetic agent before making the incision and placing the trocars.   We positioned the patient in reverse Trendelenburg, tilted slightly to the patient's left.  The gallbladder was identified, the fundus grasped and retracted cephalad. Adhesions were lysed bluntly and with the electrocautery where indicated, taking care not to injure any adjacent organs or viscus. The infundibulum was grasped and retracted laterally, exposing the peritoneum overlying the triangle of Calot. This was then divided and exposed in a blunt fashion. A critical view of the cystic duct and cystic artery was obtained.  The cystic duct was clearly identified and bluntly dissected circumferentially. The cystic duct was ligated with a clip distally.   An incision was made in the cystic duct and [facility] introduced. The catheter was secured using a clip. A cholangiogram was then obtained which showed good visualization of the distal and proximal biliary tree with no sign of filling defects or obstruction.  Contrast flowed easily into the duodenum. The catheter was then removed.   The cystic duct was then ligated with clips and divided. The cystic artery was identified, dissected free, ligated with clips and divided as well.   The gallbladder was dissected from the liver bed in retrograde fashion with the electrocautery. The gallbladder was removed and placed in an Endocatch sac. The liver bed was irrigated  and inspected.  Hemostasis was achieved with the electrocautery. Copious irrigation was utilized and was repeatedly aspirated until clear.  The gallbladder and Endocatch sac were then removed through the umbilical port site.  The pursestring suture was used to close the umbilical fascia.    We again inspected the right upper quadrant for hemostasis.  Pneumoperitoneum was released as we removed the trocars.  4-0 Monocryl was used to close the skin.   Benzoin, steri-strips, and clean dressings were applied. The patient was then extubated and brought to the recovery room in stable condition. Instrument, sponge, and needle counts were correct at closure and at the conclusion of the case.   Findings: Cholecystitis with Cholelithiasis  Estimated Blood Loss: Minimal         Drains: none         Specimens: Gallbladder           Complications: None; patient tolerated the procedure well.         Disposition: PACU - hemodynamically stable.         Condition: stable  Mary Arms. Corliss Skains, MD, [facility] Surgery  General/ Trauma Surgery  06/11/2017 4:41 PM

## 2017-06-11 NOTE — ED Notes (Signed)
ED Provider at bedside. 

## 2017-06-11 NOTE — ED Notes (Signed)
Patient returned from XRay

## 2017-06-11 NOTE — Anesthesia Procedure Notes (Signed)
Procedure Name: Intubation Date/Time: 06/11/2017 3:42 PM Performed by: Marena ChancyBeckner, Jayci Ellefson S, CRNA Pre-anesthesia Checklist: Patient identified, Emergency Drugs available, Suction available and Patient being monitored Patient Re-evaluated:Patient Re-evaluated prior to induction Oxygen Delivery Method: Circle System Utilized Preoxygenation: Pre-oxygenation with 100% oxygen Induction Type: IV induction Ventilation: Mask ventilation without difficulty Laryngoscope Size: Miller and 2 Grade View: Grade II Tube type: Oral Tube size: 7.0 mm Number of attempts: 1 Airway Equipment and Method: Stylet and Oral airway Placement Confirmation: ETT inserted through vocal cords under direct vision,  positive ETCO2 and breath sounds checked- equal and bilateral Tube secured with: Tape Dental Injury: Teeth and Oropharynx as per pre-operative assessment

## 2017-06-11 NOTE — ED Notes (Signed)
Pt in radiology. Delay in blood draw + EKG.

## 2017-06-11 NOTE — Anesthesia Preprocedure Evaluation (Signed)

## 2017-06-11 NOTE — Discharge Instructions (Signed)

## 2017-06-11 NOTE — H&P (Signed)
[facility] Admission Note  Mary Gibbs 1985-06-08  [id number].    Requesting MD: Blanchie Dessert Chief Complaint/Reason for Consult: symptomatic cholelithiasis  HPI:  Mary Gibbs is a 32yo female with known history of symptomatic cholelithiasis, who presented to [facility] earlier today with worsening abdominal pain, nausea, diarrhea, and chills. She is scheduled for elective cholecystectomy 07/09/17 with Dr. Marlou Starks, but came to the ED due to worsening symptoms. States that it started about 3 days ago. It is constant and severe. Pain is epigastric/RUQ and radiates into her back. Worse with PO intake.  Today her WBC, LFTs, and lipase are WNL. U/s shows cholelithiasis without signs of acute cholecystitis. General surgery asked to see. She drank some coffee around 0400 this AM.  No significant PMH Abdominal surgical history: none Anticoagulants: none Nonsmoker Employment: hospice RN  ROS: Review of Systems  Constitutional: Positive for chills. Negative for fever.  HENT: Negative.   Eyes: Negative.   Respiratory: Negative.   Cardiovascular: Negative.   Gastrointestinal: Positive for abdominal pain, diarrhea and nausea. Negative for blood in stool, melena and vomiting.  Genitourinary: Negative.   Musculoskeletal: Negative.   Skin: Negative.   Neurological: Negative.    All systems reviewed and otherwise negative except for as above  Family History  Problem Relation Age of Onset  . Epilepsy Sister   . Depression Sister   . Bipolar disorder Sister   . Hypertension Mother   . Tuberculosis Mother        as a child  . Diabetes Mother        diet controlled  . Depression Mother   . Colon cancer Paternal Grandfather   . Cancer Paternal Grandfather        prostate  . Breast cancer Maternal Aunt        x 3;after menopause  . Diabetes Maternal Grandmother   . Hypertension Maternal Grandmother   . Stomach cancer Paternal Grandmother   . Heart disease Neg Hx     Past  Medical History:  Diagnosis Date  . Anemia    @ last [facility] appt  . Depression    Zoloft in the past  . H/O hiatal hernia   . Hx of ulcer disease   . Hx of varicella   . IUD (intrauterine device) in place 2014   Mirena, sees [facility]  . NSVD (normal spontaneous vaginal delivery) 08/26/2012    Past Surgical History:  Procedure Laterality Date  . TONSILLECTOMY     As a baby    Social History:  reports that she quit smoking about 13 months ago. Her smoking use included cigarettes. She has a 2.00 pack-year smoking history. she has never used smokeless tobacco. She reports that she drinks alcohol. She reports that she does not use drugs.  Allergies: No Known Allergies   (Not in a hospital admission)  Prior to Admission medications   Medication Sig Start Date End Date Taking? Authorizing Provider  BLACK CURRANT SEED OIL PO Take 1 capsule by mouth daily.   Yes [provider]  ibuprofen (ADVIL,MOTRIN) 600 MG tablet Take 1 tablet (600 mg total) by mouth every 8 (eight) hours as needed. 04/28/17  Yes Tysinger, Camelia Eng, PA-C  levonorgestrel (MIRENA) 20 MCG/24HR IUD 1 each by Intrauterine route once.   Yes [provider]  LORazepam (ATIVAN) 1 MG tablet Take 1 tablet (1 mg total) by mouth at bedtime. 04/28/17  Yes Tysinger, Camelia Eng, PA-C  Melatonin 5 MG TABS Take  5 mg by mouth at bedtime.   Yes [provider]  pantoprazole (PROTONIX) 40 MG tablet TAKE 1 TABLET BY MOUTH 45 MINS PRIOR TO BREAKFAST 01/13/17  Yes Tysinger, Camelia Eng, PA-C  Probiotic Product (ALIGN) 4 MG CAPS Take 1 capsule (4 mg total) by mouth daily. 04/28/17  Yes Tysinger, Camelia Eng, PA-C    Blood pressure 109/69, pulse 74, temperature 98.3 F (36.8 C), temperature source Oral, resp. rate 15, height 5' 1"  (1.549 m), weight 217 lb (98.4 kg), SpO2 100 %. Physical Exam: General: pleasant, WD/WN AA female who is laying in bed in NAD HEENT: head is normocephalic, atraumatic.  Sclera are  noninjected.  Pupils equal and round.  Ears and nose without any masses or lesions.  Mouth is pink and moist. Dentition fair Heart: regular, rate, and rhythm.  No obvious murmurs, gallops, or rubs noted.  Palpable pedal pulses bilaterally Lungs: CTAB, no wheezes, rhonchi, or rales noted.  Respiratory effort nonlabored Abd: soft, ND, +BS, no masses, hernias, or organomegaly. epigastric tenderness without rebound or guarding MS: all 4 extremities are symmetrical with no cyanosis, clubbing, or edema. Skin: warm and dry with no masses, lesions, or rashes Psych: A&Ox3 with an appropriate affect. Neuro: cranial nerves grossly intact, extremity CSM intact bilaterally, normal speech  Results for orders placed or performed during the hospital encounter of 06/11/17 (from the past 48 hour(s))  Lipase, blood     Status: None   Collection Time: 06/11/17  6:21 AM  Result Value Ref Range   Lipase 31 11 - 51 U/L  Comprehensive metabolic panel     Status: Abnormal   Collection Time: 06/11/17  6:21 AM  Result Value Ref Range   Sodium 135 135 - 145 mmol/L   Potassium 4.4 3.5 - 5.1 mmol/L   Chloride 104 101 - 111 mmol/L   CO2 24 22 - 32 mmol/L   Glucose, Bld 104 (H) 65 - 99 mg/dL   BUN 10 6 - 20 mg/dL   Creatinine, Ser 0.81 0.44 - 1.00 mg/dL   Calcium 8.8 (L) 8.9 - 10.3 mg/dL   Total Protein 7.0 6.5 - 8.1 g/dL   Albumin 3.5 3.5 - 5.0 g/dL   AST 19 15 - 41 U/L   ALT 14 14 - 54 U/L   Alkaline Phosphatase 47 38 - 126 U/L   Total Bilirubin 0.7 0.3 - 1.2 mg/dL   GFR calc non Af Amer >60 >60 mL/min   GFR calc Af Amer >60 >60 mL/min    Comment: (NOTE) The eGFR has been calculated using the CKD EPI equation. This calculation has not been validated in all clinical situations. eGFR's persistently <60 mL/min signify possible Chronic Kidney Disease.    Anion gap 7 5 - 15  CBC     Status: None   Collection Time: 06/11/17  6:21 AM  Result Value Ref Range   WBC 7.9 4.0 - 10.5 K/uL   RBC 3.98 3.87 - 5.11  MIL/uL   Hemoglobin 12.1 12.0 - 15.0 g/dL   HCT 37.7 36.0 - 46.0 %   MCV 94.7 78.0 - 100.0 fL   MCH 30.4 26.0 - 34.0 pg   MCHC 32.1 30.0 - 36.0 g/dL   RDW 12.9 11.5 - 15.5 %   Platelets 348 150 - 400 K/uL  Urinalysis, Routine w reflex microscopic     Status: Abnormal   Collection Time: 06/11/17  6:23 AM  Result Value Ref Range   Color, Urine YELLOW YELLOW   APPearance  CLEAR CLEAR   Specific Gravity, Urine 1.011 1.005 - 1.030   pH 5.0 5.0 - 8.0   Glucose, UA NEGATIVE NEGATIVE mg/dL   Hgb urine dipstick SMALL (A) NEGATIVE   Bilirubin Urine NEGATIVE NEGATIVE   Ketones, ur NEGATIVE NEGATIVE mg/dL   Protein, ur NEGATIVE NEGATIVE mg/dL   Nitrite NEGATIVE NEGATIVE   Leukocytes, UA NEGATIVE NEGATIVE   RBC / HPF 0-5 0 - 5 RBC/hpf   WBC, UA 0-5 0 - 5 WBC/hpf   Bacteria, UA NONE SEEN NONE SEEN   Squamous Epithelial / LPF 0-5 (A) NONE SEEN  I-Stat beta hCG blood, ED     Status: None   Collection Time: 06/11/17  6:42 AM  Result Value Ref Range   I-stat hCG, quantitative <5.0 <5 mIU/mL   Comment 3            Comment:   GEST. AGE      CONC.  (mIU/mL)   <=1 WEEK        5 - 50     2 WEEKS       50 - 500     3 WEEKS       100 - 10,000     4 WEEKS     1,000 - 30,000        FEMALE AND NON-PREGNANT FEMALE:     LESS THAN 5 mIU/mL   I-stat troponin, ED     Status: None   Collection Time: 06/11/17  9:00 AM  Result Value Ref Range   Troponin i, poc 0.00 0.00 - 0.08 ng/mL   Comment 3            Comment: Due to the release kinetics of cTnI, a negative result within the first hours of the onset of symptoms does not rule out myocardial infarction with certainty. If myocardial infarction is still suspected, repeat the test at appropriate intervals.    Dg Chest 2 View  Result Date: 06/11/2017 CLINICAL DATA:  Chest tightness and epigastric pain. EXAM: CHEST  2 VIEW COMPARISON:  Chest x-ray dated November 27, 2015. FINDINGS: The cardiomediastinal silhouette is normal in size. Normal pulmonary  vascularity. Minimal right basilar atelectasis. No focal consolidation, pleural effusion, or pneumothorax. No acute osseous abnormality. IMPRESSION: No active cardiopulmonary disease. Electronically Signed   By: Titus Dubin M.D.   On: 06/11/2017 08:22   US Abdomen Limited Ruq  Result Date: 06/11/2017 CLINICAL DATA:  Abdominal pain.  History of gallstones EXAM: ULTRASOUND ABDOMEN LIMITED RIGHT UPPER QUADRANT COMPARISON:  06/20/2016 FINDINGS: Gallbladder: Numerous small gallstones filling the gallbladder, 8 mm or less in size. No wall thickening. Negative sonographic Murphy's. Common bile duct: Diameter: Normal caliber, 3 mm Liver: No focal lesion identified. Within normal limits in parenchymal echogenicity. Portal vein is patent on color Doppler imaging with normal direction of blood flow towards the liver. IMPRESSION: Numerous small stones fill the gallbladder. No evidence of acute cholecystitis. Electronically Signed   By: Rolm Baptise M.D.   On: 06/11/2017 08:58      Assessment/Plan Symptomatic cholelithiasis - no prior abdominal surgeries - intermittent abdominal pain for years, was scheduled for elective cholecystectomy with Dr. Marlou Starks 07/09/17 but came to the ED due to increasing pain - WBC, LFTs, lipase WNL - u/s shows cholelithiasis without signs of acute cholecystitis  ID - rocephin 1/10>> VTE - SCDs FEN - IVF, NPO Foley - none Follow up - TBD  Plan - Admit to med-surg. Will start rocephin. Keep NPO. Plan for laparoscopic  cholecystectomy later today.  Wellington Hampshire, [facility] 06/11/2017, 10:57 AM Pager: [phone] Consults: [phone] Mon-Fri 7:00 am-4:30 pm Sat-Sun 7:00 am-11:30 am

## 2017-06-11 NOTE — Transfer of Care (Signed)
Immediate Anesthesia Transfer of Care Note  Patient: Mary Gibbs  Procedure(s) Performed: LAPAROSCOPIC CHOLECYSTECTOMY WITH INTRAOPERATIVE CHOLANGIOGRAM (N/A )  Patient Location: PACU  Anesthesia Type:General  Level of Consciousness: awake, alert  and oriented  Airway & Oxygen Therapy: Patient Spontanous Breathing and Patient connected to nasal cannula oxygen  Post-op Assessment: Report given to RN and Post -op Vital signs reviewed and stable  Post vital signs: Reviewed and stable  Last Vitals:  Vitals:   06/11/17 1015 06/11/17 1045  BP: 112/68 109/69  Pulse: 65 74  Resp: 19 15  Temp:    SpO2: 97% 100%    Last Pain:  Vitals:   06/11/17 0728  TempSrc:   PainSc: 7          Complications: No apparent anesthesia complications

## 2017-06-11 NOTE — ED Notes (Signed)
Patient transported to X-ray 

## 2017-06-12 ENCOUNTER — Encounter (HOSPITAL_COMMUNITY): Payer: Self-pay | Admitting: Surgery

## 2017-06-12 LAB — HIV ANTIBODY (ROUTINE TESTING W REFLEX): HIV Screen 4th Generation wRfx: NONREACTIVE

## 2017-06-12 MED ORDER — OXYCODONE HCL 5 MG PO TABS: 5.0000 mg | ORAL_TABLET | Freq: Four times a day (QID) | ORAL | 0 refills | Status: DC | PRN

## 2017-06-12 NOTE — Plan of Care (Signed)
Nutrition Education Note  RD consulted for nutrition education.   Case discussed with RN prior to visit; pt is very anxious about discharge and is requesting to speak with RD prior to leaving the hospital.   Spoke with pt, who was very tearful during visit. She reports she wants to know what she can eat to follow a healthful lifestyle. Also spoke with pt father via speaker phone; he also verbalized anxiety over pt's health and wants to ensure pt follows a healthful diet after discharge.   RD provided "General Healthful Nutrition Therapy" handout from the [employer] of [employer]. Reviewed patient's dietary recall. Encouraged fresh fruits and vegetables as well as whole grain sources of carbohydrates to maximize fiber intake.  Emphasized the importance of serving sizes and provided examples of correct portions of common foods. Discussed importance of controlled and consistent intake throughout the day. Provided examples of ways to balance meals/snacks and encouraged intake of high-fiber, whole grain complex carbohydrates. Emphasized the importance of hydration with calorie-free beverages and limiting sugar-sweetened beverages. Encouraged pt to discuss physical activity options with physician. Teach back method used.  Expect fair compliance.  Body mass index is 40.91 kg/m. Pt meets criteria for extreme obesity, class III based on current BMI.  Current diet order is regular, patient is consuming approximately 100% of meals at this time. Labs and medications reviewed. No further nutrition interventions warranted at this time. RD contact information provided. If additional nutrition issues arise, please re-consult RD.  Libra Gatz A. Mayford KnifeWilliams, RD, LDN, CDE Pager: [phone] After hours Pager: [phone]

## 2017-06-12 NOTE — Discharge Summary (Signed)
[facility] Discharge Summary   Patient ID: Mary Gibbs MRN: [id number] DOB/AGE: 1986/04/04 32 y.o.  Admit date: 06/11/2017 Discharge date: 06/12/2017  Admitting Diagnosis: Symptomatic cholelithiasis  Discharge Diagnosis Patient Active Problem List   Diagnosis Date Noted  . Symptomatic cholelithiasis 06/11/2017  . Anemia, iron deficiency 08/28/2015  . Encounter for health maintenance examination in adult 08/28/2015  . Pain of upper abdomen 08/28/2015  . Influenza vaccination declined 08/28/2015  . NSVD (normal spontaneous vaginal delivery) 08/26/2012  . Perineal laceration with delivery, second degree 08/26/2012  . Obesity 06/14/2012    Consultants None  Imaging: Dg Chest 2 View  Result Date: 06/11/2017 CLINICAL DATA:  Chest tightness and epigastric pain. EXAM: CHEST  2 VIEW COMPARISON:  Chest x-ray dated November 27, 2015. FINDINGS: The cardiomediastinal silhouette is normal in size. Normal pulmonary vascularity. Minimal right basilar atelectasis. No focal consolidation, pleural effusion, or pneumothorax. No acute osseous abnormality. IMPRESSION: No active cardiopulmonary disease. Electronically Signed   By: Obie Dredge M.D.   On: 06/11/2017 08:22   Dg Cholangiogram Operative  Result Date: 06/11/2017 CLINICAL DATA:  Intraoperative cholangiogram during laparoscopic cholecystectomy. EXAM: INTRAOPERATIVE CHOLANGIOGRAM FLUOROSCOPY TIME:  6 seconds COMPARISON:  Right upper quadrant abdominal ultrasound - 06/11/2017 FINDINGS: Intraoperative cholangiographic images of the right upper abdominal quadrant during laparoscopic cholecystectomy are provided for review. Surgical clips overlie the expected location of the gallbladder fossa. Contrast injection demonstrates selective cannulation of the central aspect of the cystic duct. There is passage of contrast through the central aspect of the cystic duct with filling of a non dilated common bile duct. There is passage of  contrast though the CBD and into the descending portion of the duodenum. There is minimal reflux of injected contrast into the common hepatic duct and central aspect of the non dilated intrahepatic biliary system. There are no discrete filling defects within the opacified portions of the biliary system to suggest the presence of choledocholithiasis. IMPRESSION: No evidence of choledocholithiasis. Electronically Signed   By: Simonne Come M.D.   On: 06/11/2017 17:58   US Abdomen Limited Ruq  Result Date: 06/11/2017 CLINICAL DATA:  Abdominal pain.  History of gallstones EXAM: ULTRASOUND ABDOMEN LIMITED RIGHT UPPER QUADRANT COMPARISON:  06/20/2016 FINDINGS: Gallbladder: Numerous small gallstones filling the gallbladder, 8 mm or less in size. No wall thickening. Negative sonographic Murphy's. Common bile duct: Diameter: Normal caliber, 3 mm Liver: No focal lesion identified. Within normal limits in parenchymal echogenicity. Portal vein is patent on color Doppler imaging with normal direction of blood flow towards the liver. IMPRESSION: Numerous small stones fill the gallbladder. No evidence of acute cholecystitis. Electronically Signed   By: Charlett Nose M.D.   On: 06/11/2017 08:58    Procedures Dr. Corliss Skains (06/11/16) - Laparoscopic Cholecystectomy  Hospital Course:  MIESHIA PEPITONE is a 32yo female who presented to [facility] with worsening RUQ/epigastric pain. Patient was scheduled for elective cholecystectomy 07/09/17.  Workup showed symptomatic cholelithiasis with WBC, LFTs, and lipase WNL; u/s with cholelithiasis without signs of acute cholecystitis.  Patient was admitted and underwent procedure listed above.  Tolerated procedure well and was transferred to the floor.  Diet was advanced as tolerated.  On POD1, the patient was voiding well, tolerating diet, ambulating well, pain well controlled, vital signs stable, incisions c/d/i and felt stable for discharge home.  Patient will follow up in our office in 2 weeks  and knows to call with questions or concerns.    I have personally reviewed the patients medication  history on the [place] controlled substance database.   Physical Exam: General:  Alert, NAD, pleasant, comfortable Pulm: effort normal Abd:  Soft, ND, appropriately tender, +BS, multiple lap incisions with C/D/I dressings in place   Allergies as of 06/12/2017   No Known Allergies     Medication List    TAKE these medications   ALIGN 4 MG Caps Take 1 capsule (4 mg total) by mouth daily.   BLACK CURRANT SEED OIL PO Take 1 capsule by mouth daily.   ibuprofen 600 MG tablet Commonly known as:  ADVIL,MOTRIN Take 1 tablet (600 mg total) by mouth every 8 (eight) hours as needed.   levonorgestrel 20 MCG/24HR IUD Commonly known as:  MIRENA 1 each by Intrauterine route once.   LORazepam 1 MG tablet Commonly known as:  ATIVAN Take 1 tablet (1 mg total) by mouth at bedtime.   Melatonin 5 MG Tabs Take 5 mg by mouth at bedtime.   oxyCODONE 5 MG immediate release tablet Commonly known as:  Oxy IR/ROXICODONE Take 1 tablet (5 mg total) by mouth every 6 (six) hours as needed for severe pain.   pantoprazole 40 MG tablet Commonly known as:  PROTONIX TAKE 1 TABLET BY MOUTH 45 MINS PRIOR TO BREAKFAST        Follow-up Information    [facility].   Specialty:  General Surgery Why:  Your appointment is 06/23/17 at 11:45 am. Please arrive 30 minutes prior to your appointment to check in and fill out paperwork. Bring photo ID and insurance information.  Contact information: [address] [place] [id number] [phone]          Signed: Franne FortsBrooke A Katrina Brosh, [facility] 06/12/2017, 7:34 AM Pager: [phone] Consults: [phone] Mon-Fri 7:00 am-4:30 pm Sat-Sun 7:00 am-11:30 am

## 2017-07-09 ENCOUNTER — Ambulatory Visit: Admit: 2017-07-09 | Payer: 59 | Admitting: General Surgery

## 2017-07-09 SURGERY — LAPAROSCOPIC CHOLECYSTECTOMY WITH INTRAOPERATIVE CHOLANGIOGRAM
Anesthesia: General

## 2017-07-22 ENCOUNTER — Other Ambulatory Visit: Payer: Self-pay | Admitting: Medical

## 2017-07-22 NOTE — Telephone Encounter (Signed)
Decline med and get her in for visit or physical

## 2017-07-22 NOTE — Telephone Encounter (Signed)
Can pt have a refill on meds  

## 2017-09-02 ENCOUNTER — Other Ambulatory Visit: Payer: Self-pay | Admitting: Medical

## 2017-09-03 MED ORDER — LORAZEPAM 1 MG PO TABS: 1.0000 mg | ORAL_TABLET | Freq: Every day | ORAL | 0 refills | Status: DC

## 2017-09-03 NOTE — Telephone Encounter (Signed)
Please advise. Thank you

## 2018-03-29 ENCOUNTER — Encounter: Payer: Self-pay | Admitting: Medical

## 2018-03-29 ENCOUNTER — Telehealth: Payer: Self-pay | Admitting: Medical

## 2018-03-29 ENCOUNTER — Ambulatory Visit: Payer: BLUE CROSS/BLUE SHIELD | Admitting: Medical

## 2018-03-29 VITALS — BP 120/74 | HR 75 | Temp 98.0°F | Resp 16 | Ht 61.0 in | Wt 236.4 lb

## 2018-03-29 DIAGNOSIS — E669 Obesity, unspecified: Secondary | ICD-10-CM

## 2018-03-29 DIAGNOSIS — R7301 Impaired fasting glucose: Secondary | ICD-10-CM | POA: Diagnosis not present

## 2018-03-29 DIAGNOSIS — R319 Hematuria, unspecified: Secondary | ICD-10-CM | POA: Insufficient documentation

## 2018-03-29 LAB — POCT URINALYSIS DIP (PROADVANTAGE DEVICE)
Bilirubin, UA: NEGATIVE
GLUCOSE UA: NEGATIVE mg/dL
Ketones, POC UA: NEGATIVE mg/dL
Leukocytes, UA: NEGATIVE
NITRITE UA: NEGATIVE
PROTEIN UA: NEGATIVE mg/dL
Specific Gravity, Urine: 1.015
UUROB: NEGATIVE
pH, UA: 7 (ref 5.0–8.0)

## 2018-03-29 NOTE — Telephone Encounter (Signed)
Please call: We will send urine for culture given the microscopic blood and pH.  If this comes back normal, then we will do one additional "morning" clean catch urine specimen to verify no blood.    Diabetes marker normal  Recommendations  If pap smear not within last 3 years, recommend pap.  She also mentioned Mirena is due to be taken out or replaced now  I recommend exercising most days of the week using a type of exercise that they would enjoy and stick to such as walking, running, swimming, hiking, biking, aerobics, etc.  I recommend a healthy diet.    Do's:  whole grains such as whole grain pasta, rice, whole grains breads and whole grain cereals.  Use small quantities such as 1/2 cup per serving or 2 slices of bread per serving.   Eat 3-5 fruits daily Eat beans at least once daily Eat almonds in small quantities at least 3 days per week   If they eat meat, I recommend small portions of lean meats such as chicken, fish, and [place]. Eat as much NON corn and NON potato vegetables as they like, particularly raw or steamed Drink several large glasses of water daily  Cautions: Limit red meat Limit corn and potatoes Limit sweets, cake, pie, candy Limit beer and alcohol Avoid fried food, fast food, large portions Avoid sugary drinks such as regular soda and sweet tea

## 2018-03-29 NOTE — Progress Notes (Signed)
Subjective: Chief Complaint  Patient presents with  . blood in urine    blood in urine from pe last week   Here for f/u from employment physical last week, had blood in urine.  Not seeing blood in urine.  On Mirena birth control, due to have it changed soon.  Not having periods on this.  No burning with urination, on frequency or urgency.   No fever, no back or belly pain.     She does feel thirsty all the time.  No blurred vision no polyuria.  No other complaint   Past Medical History:  Diagnosis Date  . Anemia    @ last [facility] appt  . Depression    Zoloft in the past  . H/O hiatal hernia   . Hx of ulcer disease   . Hx of varicella   . IUD (intrauterine device) in place 2014   Mirena, [place] [address]  . NSVD (normal spontaneous vaginal delivery) 08/26/2012   Current Outpatient Medications on File Prior to Visit  Medication Sig Dispense Refill  . BLACK CURRANT SEED OIL PO Take 1 capsule by mouth daily.    Marland Kitchen ibuprofen (ADVIL,MOTRIN) 600 MG tablet Take 1 tablet (600 mg total) by mouth every 8 (eight) hours as needed. 30 tablet 0  . levonorgestrel (MIRENA) 20 MCG/24HR IUD 1 each by Intrauterine route once.    . Melatonin 5 MG TABS Take 5 mg by mouth at bedtime.    . Probiotic Product (ALIGN) 4 MG CAPS Take 1 capsule (4 mg total) by mouth daily. 30 capsule 2  . LORazepam (ATIVAN) 1 MG tablet Take 1 tablet (1 mg total) by mouth at bedtime. (Patient not taking: Reported on 03/29/2018) 30 tablet 0  . oxyCODONE (OXY IR/ROXICODONE) 5 MG immediate release tablet Take 1 tablet (5 mg total) by mouth every 6 (six) hours as needed for severe pain. (Patient not taking: Reported on 03/29/2018) 20 tablet 0  . pantoprazole (PROTONIX) 40 MG tablet TAKE 1 TABLET BY MOUTH 45 MINS PRIOR TO BREAKFAST (Patient not taking: Reported on 03/29/2018) 90 tablet 1   No current facility-administered medications on file prior to visit.    ROS as in subjective   Objective: BP 120/74   Pulse 75    Temp 98 F (36.7 C) (Oral)   Resp 16   Ht 5\' 1"  (1.549 m)   Wt 236 lb 6.4 oz (107.2 kg)   SpO2 98%   BMI 44.67 kg/m   Gen: wd, wn, nad, obese aa female Abdomen: nontender, no mass, no organomegaly Back: nontender    Assessment: Encounter Diagnoses  Name Primary?  . Hematuria, unspecified type Yes  . Impaired fasting blood sugar   . Obesity with serious comorbidity, unspecified classification, unspecified obesity type      Plan: Hematuria - urine microscopic reviewed today, few RBCs, othrewise normal.  Urine culture sent.  Impaired glucose - HgbA1C today 5.4 %.  Obesity - counseled on lifestyle changes   Berklie was seen today for blood in urine.  Diagnoses and all orders for this visit:  Hematuria, unspecified type -     POCT Urinalysis DIP (Proadvantage Device) -     Urine Culture  Impaired fasting blood sugar  Obesity with serious comorbidity, unspecified classification, unspecified obesity type

## 2018-03-30 LAB — URINE CULTURE: Organism ID, Bacteria: NO GROWTH

## 2018-03-30 NOTE — Telephone Encounter (Signed)
Patient notified of lab results

## 2018-12-06 DIAGNOSIS — Z124 Encounter for screening for malignant neoplasm of cervix: Secondary | ICD-10-CM | POA: Diagnosis not present

## 2018-12-06 DIAGNOSIS — Z30011 Encounter for initial prescription of contraceptive pills: Secondary | ICD-10-CM | POA: Diagnosis not present

## 2018-12-06 DIAGNOSIS — Z30432 Encounter for removal of intrauterine contraceptive device: Secondary | ICD-10-CM | POA: Diagnosis not present

## 2018-12-06 DIAGNOSIS — Z113 Encounter for screening for infections with a predominantly sexual mode of transmission: Secondary | ICD-10-CM | POA: Diagnosis not present

## 2018-12-06 DIAGNOSIS — Z01419 Encounter for gynecological examination (general) (routine) without abnormal findings: Secondary | ICD-10-CM | POA: Diagnosis not present

## 2018-12-06 DIAGNOSIS — Z1151 Encounter for screening for human papillomavirus (HPV): Secondary | ICD-10-CM | POA: Diagnosis not present

## 2018-12-15 LAB — HM PAP SMEAR: HM Pap smear: NORMAL

## 2018-12-15 LAB — RESULTS CONSOLE HPV: CHL HPV: NEGATIVE

## 2019-03-22 ENCOUNTER — Ambulatory Visit: Payer: BLUE CROSS/BLUE SHIELD | Admitting: Medical

## 2019-03-23 ENCOUNTER — Ambulatory Visit: Payer: Self-pay | Admitting: Medical

## 2019-03-23 ENCOUNTER — Other Ambulatory Visit: Payer: Self-pay

## 2019-08-01 ENCOUNTER — Ambulatory Visit: Payer: BC Managed Care – PPO | Admitting: Podiatry

## 2019-08-01 ENCOUNTER — Ambulatory Visit (INDEPENDENT_AMBULATORY_CARE_PROVIDER_SITE_OTHER): Payer: BC Managed Care – PPO

## 2019-08-01 ENCOUNTER — Encounter: Payer: Self-pay | Admitting: Podiatry

## 2019-08-01 ENCOUNTER — Telehealth: Payer: Self-pay | Admitting: Podiatry

## 2019-08-01 ENCOUNTER — Ambulatory Visit (INDEPENDENT_AMBULATORY_CARE_PROVIDER_SITE_OTHER): Payer: BC Managed Care – PPO | Admitting: Podiatry

## 2019-08-01 ENCOUNTER — Other Ambulatory Visit: Payer: Self-pay

## 2019-08-01 ENCOUNTER — Other Ambulatory Visit: Payer: Self-pay | Admitting: Podiatry

## 2019-08-01 DIAGNOSIS — M216X1 Other acquired deformities of right foot: Secondary | ICD-10-CM

## 2019-08-01 DIAGNOSIS — M205X2 Other deformities of toe(s) (acquired), left foot: Secondary | ICD-10-CM | POA: Diagnosis not present

## 2019-08-01 DIAGNOSIS — M79672 Pain in left foot: Secondary | ICD-10-CM

## 2019-08-01 DIAGNOSIS — M79671 Pain in right foot: Secondary | ICD-10-CM

## 2019-08-01 DIAGNOSIS — M216X2 Other acquired deformities of left foot: Secondary | ICD-10-CM

## 2019-08-01 DIAGNOSIS — M722 Plantar fascial fibromatosis: Secondary | ICD-10-CM

## 2019-08-01 NOTE — Telephone Encounter (Signed)
I'm saw where you tired to call me and I was calling you back to get my sx rescheduled. I'm moving in March and going on vacation in April. I'd like to reschedule my sx to the end of April or first of May. I scheduled pt's sx for May 4th as Dr. [name] will be assisting Dr. [name] in sx. Pt also wanted to reschedule her sx consult appt which I rescheduled to 04/16 at 9:15 am. Pt wanted to know her sx time which I told her would be at 7 am but the sx center would call her a day or two prior to let her know what time to arrive for pre-op. Pt also requested a call back from Chip Boer in our insurance and billing department for an estimate for our portion of the bill. Told pt to call back with any questions or concerns.

## 2019-08-01 NOTE — Telephone Encounter (Signed)
Tried calling pt back about her sx but got a message saying "the wireless customer you are calling is not available, please try your call again later."

## 2019-08-01 NOTE — Telephone Encounter (Signed)
I just left from seeing Dr. [name] and he is wanting me to have sx in March. I'm moving in March and I have a couple of other things going on, so I wanted to speak to you about the scheduling portion of it. If you could please call me back. Thank you.

## 2019-08-01 NOTE — Progress Notes (Signed)
Dg foo 

## 2019-08-01 NOTE — Patient Instructions (Signed)
Pre-Operative Instructions  Congratulations, you have decided to take an important step towards improving your quality of life.  You can be assured that the doctors and staff at [facility] will be with you every step of the way.  Here are some important things you should know:  1. Plan to be at the surgery center/hospital at least 1 (one) hour prior to your scheduled time, unless otherwise directed by the surgical center/hospital staff.  You must have a responsible adult accompany you, remain during the surgery and drive you home.  Make sure you have directions to the surgical center/hospital to ensure you arrive on time. 2. If you are having surgery at [facility] or [facility], you will need a copy of your medical history and physical form from your family physician within one month prior to the date of surgery. We will give you a form for your primary physician to complete.  3. We make every effort to accommodate the date you request for surgery.  However, there are times where surgery dates or times have to be moved.  We will contact you as soon as possible if a change in schedule is required.   4. No aspirin/ibuprofen for one week before surgery.  If you are on aspirin, any non-steroidal anti-inflammatory medications (Mobic, Aleve, Ibuprofen) should not be taken seven (7) days prior to your surgery.  You make take Tylenol for pain prior to surgery.  5. Medications - If you are taking daily heart and blood pressure medications, seizure, reflux, allergy, asthma, anxiety, pain or diabetes medications, make sure you notify the surgery center/hospital before the day of surgery so they can tell you which medications you should take or avoid the day of surgery. 6. No food or drink after midnight the night before surgery unless directed otherwise by surgical center/hospital staff. 7. No alcoholic beverages 24-hours prior to surgery.  No smoking 24-hours prior or 24-hours after  surgery. 8. Wear loose pants or shorts. They should be loose enough to fit over bandages, boots, and casts. 9. Don't wear slip-on shoes. Sneakers are preferred. 10. Bring your boot with you to the surgery center/hospital.  Also bring crutches or a walker if your physician has prescribed it for you.  If you do not have this equipment, it will be provided for you after surgery. 11. If you have not been contacted by the surgery center/hospital by the day before your surgery, call to confirm the date and time of your surgery. 12. Leave-time from work may vary depending on the type of surgery you have.  Appropriate arrangements should be made prior to surgery with your employer. 13. Prescriptions will be provided immediately following surgery by your doctor.  Fill these as soon as possible after surgery and take the medication as directed. Pain medications will not be refilled on weekends and must be approved by the doctor. 14. Remove nail polish on the operative foot and avoid getting pedicures prior to surgery. 15. Wash the night before surgery.  The night before surgery wash the foot and leg well with water and the antibacterial soap provided. Be sure to pay special attention to beneath the toenails and in between the toes.  Wash for at least three (3) minutes. Rinse thoroughly with water and dry well with a towel.  Perform this wash unless told not to do so by your physician.  Enclosed: 1 Ice pack (please put in freezer the night before surgery)   1 Hibiclens skin cleaner     Pre-op instructions  If you have any questions regarding the instructions, please do not hesitate to call our office.  [place]: [address], [place], [place] [address] -- [phone]  [place]: [address] [place], [place] [address] -- [phone]  [place]: [address], [place], [place] [address] -- [phone]   Website: [email or web address] 

## 2019-08-03 NOTE — Progress Notes (Signed)
Subjective:   Patient ID: Mary Gibbs, female   DOB: 34 y.o.   MRN: [id number]   HPI Patient presents stating that she has had severe pain in her heel left over right that is been going on for a long time and is getting increased pain around the big toe joint left that is been painful over right and also has tight heel cord.  Patient is seeing other physicians as had previous injections inserts immobilization anti-inflammatories without relief and previous surgery had been recommended.  Patient does not smoke and likes to be active if possible but this keeps her from doing it   Review of Systems  All other systems reviewed and are negative.       Objective:  Physical Exam Vitals and nursing note reviewed.  Constitutional:      Appearance: She is well-developed.  Pulmonary:     Effort: Pulmonary effort is normal.  Musculoskeletal:        General: Normal range of motion.  Skin:    General: Skin is warm.  Neurological:     Mental Status: She is alert.     Neurovascular status intact muscle strength was found to be adequate range of motion within normal limits.  Patient has moderate equinus condition bilateral exquisite discomfort of the medial fascial band insertion into the calcaneus left over right moderate flatfoot deformity and significant range of motion loss first MPJ left over right with quite a bit of pain in the joint with inflammation present.  Patient is found to have good digital perfusion is well oriented x3     Assessment:  Inflammatory fasciitis left which appears to be primary it is been present for a number of years along with increasing hallux limitus with structural changes left first metatarsal over right and equinus condition bilateral     Plan:  H&P different conditions discussed at great length and we discussed treatment options.  She states this is significantly interfering with her ability to lose weight or be active and she would like to look at a  permanent solution and I do think fascial release along with gastroc lengthening and repair of hallux limitus would be best.  Patient wants to have this done and I explained absolutely there is no guarantee this will solve all her pains due to longstanding nature but I did go ahead today and I educated her on this and she will be seen back in 1 week for consult and is tentatively scheduled for surgery in the next 2 to 3 weeks  X-rays indicate moderate flatfoot deformity spurring around the first metatarsal head left over right with mild elevation of the first metatarsal and minimal spur formation plantar heel bilateral

## 2019-08-08 ENCOUNTER — Ambulatory Visit: Payer: BC Managed Care – PPO | Admitting: Podiatry

## 2019-08-22 ENCOUNTER — Telehealth: Payer: Self-pay | Admitting: Podiatry

## 2019-08-22 NOTE — Telephone Encounter (Signed)
Pt called about the estimate for her surgery. I told her once we know what sx she would be having via CPT codes we could get that information for her. I told her I would get that information when she comes in for her visit on 04/16. I told her she would get a bill for our portion, a bill from the surgical center, and a bill from the anesthesiologist. I told her once I got the information, I could give that to Chip Boer in our insurance and billing department and she could give her an estimate for our portion alone and if need be, we could set her up on a payment plan. Also explained once the surgical center got the information, they would give her the portion for both them and the anesthesiologist and she could discuss with them about their possible payment plan options.

## 2019-09-09 ENCOUNTER — Encounter: Payer: BC Managed Care – PPO | Admitting: Medical

## 2019-09-09 ENCOUNTER — Telehealth: Payer: Self-pay | Admitting: Medical

## 2019-09-09 NOTE — Telephone Encounter (Signed)
Sent patient a message via my chart.  

## 2019-09-09 NOTE — Telephone Encounter (Signed)
D as below...    This patient no showed for their appointment today.Which of the following is necessary for this patient.   A) No follow-up necessary   B) Follow-up urgent. Locate Patient Immediately.   C) Follow-up necessary. Contact patient and Schedule visit in ____ Days.   D) Follow-up Advised. Contact patient and Schedule visit in ____ Days.   E) Please Send no show letter to patient. Charge no show fee if no show was a CPE.

## 2019-09-12 ENCOUNTER — Encounter: Payer: Self-pay | Admitting: Medical

## 2019-09-15 DIAGNOSIS — L309 Dermatitis, unspecified: Secondary | ICD-10-CM | POA: Diagnosis not present

## 2019-09-15 DIAGNOSIS — L731 Pseudofolliculitis barbae: Secondary | ICD-10-CM | POA: Diagnosis not present

## 2019-09-16 ENCOUNTER — Ambulatory Visit: Payer: BC Managed Care – PPO | Admitting: Podiatry

## 2019-09-16 ENCOUNTER — Other Ambulatory Visit: Payer: Self-pay

## 2019-09-16 ENCOUNTER — Encounter: Payer: Self-pay | Admitting: Podiatry

## 2019-09-16 DIAGNOSIS — M205X2 Other deformities of toe(s) (acquired), left foot: Secondary | ICD-10-CM

## 2019-09-16 DIAGNOSIS — M722 Plantar fascial fibromatosis: Secondary | ICD-10-CM | POA: Diagnosis not present

## 2019-09-16 NOTE — Patient Instructions (Signed)
Pre-Operative Instructions  Congratulations, you have decided to take an important step towards improving your quality of life.  You can be assured that the doctors and staff at [facility] will be with you every step of the way.  Here are some important things you should know:  1. Plan to be at the surgery center/hospital at least 1 (one) hour prior to your scheduled time, unless otherwise directed by the surgical center/hospital staff.  You must have a responsible adult accompany you, remain during the surgery and drive you home.  Make sure you have directions to the surgical center/hospital to ensure you arrive on time. 2. If you are having surgery at [facility] or [facility], you will need a copy of your medical history and physical form from your family physician within one month prior to the date of surgery. We will give you a form for your primary physician to complete.  3. We make every effort to accommodate the date you request for surgery.  However, there are times where surgery dates or times have to be moved.  We will contact you as soon as possible if a change in schedule is required.   4. No aspirin/ibuprofen for one week before surgery.  If you are on aspirin, any non-steroidal anti-inflammatory medications (Mobic, Aleve, Ibuprofen) should not be taken seven (7) days prior to your surgery.  You make take Tylenol for pain prior to surgery.  5. Medications - If you are taking daily heart and blood pressure medications, seizure, reflux, allergy, asthma, anxiety, pain or diabetes medications, make sure you notify the surgery center/hospital before the day of surgery so they can tell you which medications you should take or avoid the day of surgery. 6. No food or drink after midnight the night before surgery unless directed otherwise by surgical center/hospital staff. 7. No alcoholic beverages 24-hours prior to surgery.  No smoking 24-hours prior or 24-hours after  surgery. 8. Wear loose pants or shorts. They should be loose enough to fit over bandages, boots, and casts. 9. Don't wear slip-on shoes. Sneakers are preferred. 10. Bring your boot with you to the surgery center/hospital.  Also bring crutches or a walker if your physician has prescribed it for you.  If you do not have this equipment, it will be provided for you after surgery. 11. If you have not been contacted by the surgery center/hospital by the day before your surgery, call to confirm the date and time of your surgery. 12. Leave-time from work may vary depending on the type of surgery you have.  Appropriate arrangements should be made prior to surgery with your employer. 13. Prescriptions will be provided immediately following surgery by your doctor.  Fill these as soon as possible after surgery and take the medication as directed. Pain medications will not be refilled on weekends and must be approved by the doctor. 14. Remove nail polish on the operative foot and avoid getting pedicures prior to surgery. 15. Wash the night before surgery.  The night before surgery wash the foot and leg well with water and the antibacterial soap provided. Be sure to pay special attention to beneath the toenails and in between the toes.  Wash for at least three (3) minutes. Rinse thoroughly with water and dry well with a towel.  Perform this wash unless told not to do so by your physician.  Enclosed: 1 Ice pack (please put in freezer the night before surgery)   1 Hibiclens skin cleaner     Pre-op instructions  If you have any questions regarding the instructions, please do not hesitate to call our office.  [place]: [address], [place], [place] [address] -- [phone]  [place]: [address] [place], [place] [address] -- [phone]  [place]: [address], [place], [place] [address] -- [phone]   Website: [email or web address] 

## 2019-09-21 ENCOUNTER — Telehealth: Payer: Self-pay

## 2019-09-21 NOTE — Progress Notes (Signed)
Subjective:   Patient ID: Mary Gibbs, female   DOB: 34 y.o.   MRN: [id number]   HPI Patient states her left heel is killing her her big toe joint is very sore and she knows that she needs surgery for these conditions. Patient states it is been going on a long time it is gotten worse over the last long period of time   ROS      Objective:  Physical Exam  Ocular status intact with patient found to have exquisite discomfort plantar fascial band left at insertion calcaneus significant range of motion inflammation pain of the first MPJ left over right and a equinus condition with a tight gastroc left that is contributory to her problems     Assessment:  Chronic plantar fasciitis left along with hallux limitus with spur formation left and equinus condition     Plan:  H&P all conditions reviewed discussed and at this point I recommended a combination of endoscopic surgery gastroc lengthening and hallux limitus repair and I allowed her to read consent form going over all alternative treatments complications and the fact that the big toe joint may possibly have some cartilage damage ultimately could require either joint fusion or implantation. Patient understands all this completely understands total recovery will take 6 months to 1 year with no long-term guarantees and signs consent form after review. Patient was dispensed air fracture walker that I want her to start wearing now so she can get used to it and find shoes that work to give her better balance and she is encouraged to call with questions prior to procedure that may come up

## 2019-09-21 NOTE — Telephone Encounter (Addendum)
DOS 11/29/19   [place] BUNIONECTOMY LT - 28296 EPF LT - 29893 GASTROCNEMIUS RECESS LT - 01561  [employer]BS EFFECTIVE DATE - 06/03/2019  In-Network   Max Per Benefit Period Year-to-Date Remaining     CoInsurance 30%       Deductible $3500.00 $3500.00     Out-Of-Pocket 3 $6350.00 $5379.43  Copay Not Applicable Coinsurance 30%  per  Service Year Authorization Required No

## 2019-10-04 ENCOUNTER — Encounter: Payer: Self-pay | Admitting: Podiatry

## 2019-10-07 ENCOUNTER — Telehealth: Payer: Self-pay | Admitting: *Deleted

## 2019-10-07 NOTE — Telephone Encounter (Signed)
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 Could not reach patient,left VM to call back.

## 2019-10-07 NOTE — Telephone Encounter (Signed)
Patient called and has some surgical questions for the nurse. I called back but no answer and could not leave a VM.

## 2019-10-12 ENCOUNTER — Encounter: Payer: BC Managed Care – PPO | Admitting: Podiatry

## 2019-11-02 ENCOUNTER — Encounter: Payer: BC Managed Care – PPO | Admitting: Podiatry

## 2019-11-28 MED ORDER — ONDANSETRON HCL 4 MG PO TABS: 4.0000 mg | ORAL_TABLET | Freq: Three times a day (TID) | ORAL | 0 refills | Status: DC | PRN

## 2019-11-28 MED ORDER — OXYCODONE-ACETAMINOPHEN 10-325 MG PO TABS: 1.0000 | ORAL_TABLET | ORAL | 0 refills | Status: DC | PRN

## 2019-11-28 NOTE — Addendum Note (Signed)
Addended by: Lenn Sink on: 11/28/2019 04:09 PM   Modules accepted: Orders

## 2019-11-29 DIAGNOSIS — M2022 Hallux rigidus, left foot: Secondary | ICD-10-CM | POA: Diagnosis not present

## 2019-11-29 DIAGNOSIS — M216X9 Other acquired deformities of unspecified foot: Secondary | ICD-10-CM | POA: Diagnosis not present

## 2019-11-29 DIAGNOSIS — M216X2 Other acquired deformities of left foot: Secondary | ICD-10-CM | POA: Diagnosis not present

## 2019-11-29 DIAGNOSIS — M722 Plantar fascial fibromatosis: Secondary | ICD-10-CM | POA: Diagnosis not present

## 2019-11-29 DIAGNOSIS — M25572 Pain in left ankle and joints of left foot: Secondary | ICD-10-CM | POA: Diagnosis not present

## 2019-11-29 DIAGNOSIS — Z01818 Encounter for other preprocedural examination: Secondary | ICD-10-CM | POA: Diagnosis not present

## 2019-11-29 DIAGNOSIS — M205X2 Other deformities of toe(s) (acquired), left foot: Secondary | ICD-10-CM | POA: Diagnosis not present

## 2019-11-29 DIAGNOSIS — M2012 Hallux valgus (acquired), left foot: Secondary | ICD-10-CM | POA: Diagnosis not present

## 2019-11-30 ENCOUNTER — Telehealth: Payer: Self-pay | Admitting: *Deleted

## 2019-11-30 NOTE — Telephone Encounter (Signed)
Called patient back at [phone] to answer their questions regarding their surgery they had yesterday, June 29,2021 with Dr. [name].  Patient said she was having [place] and vomiting and had pain.She took pain medication at 10:30 pm and by 12:30 am foot started to hurt again in the big toe area. I advised patient to continue to take their pain medication as directed and to take their Zofran medicine for the N/V they were having. Patient also stated that their leg is still numb as well. I told her that was normal.  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 I told patient I recommend her not going to the function, but if she does, she needs to be sitting and not standing or walking on her foot.  I also advised patient to keep their foot elevated for 45 minutes out of every hour and only get up on it for 15 minutes only. I told patient that this is usually when she would need to use the restroom. For her not to be up and cooking for her kids. Patient has one child that is 81 yrs old and I told patient maybe they could help with their other child that is 62 yrs old.  Patient stated they understood.

## 2019-12-01 ENCOUNTER — Telehealth: Payer: Self-pay | Admitting: Podiatry

## 2019-12-01 DIAGNOSIS — M205X2 Other deformities of toe(s) (acquired), left foot: Secondary | ICD-10-CM

## 2019-12-01 DIAGNOSIS — Z9889 Other specified postprocedural states: Secondary | ICD-10-CM

## 2019-12-01 NOTE — Telephone Encounter (Signed)
Left message informing pt, I would send orders to [employer] for her knee scooter, and she should receive a call from them today or tomorrow, and their Retail Dept. [phone] if any questions. Faxed required form, SnapShot with clinicals and demographics with insurance to [employer] and emailed.

## 2019-12-01 NOTE — Addendum Note (Signed)
Addended by: Alphia Kava D on: 12/01/2019 01:58 PM   Modules accepted: Orders

## 2019-12-01 NOTE — Telephone Encounter (Signed)
Not sure how to sign them

## 2019-12-01 NOTE — Telephone Encounter (Signed)
Pt had surgery on 11/29/19 and is calling requesting a knee scooter.  Please give patient a call to discuss and possibly order scooter for patient.

## 2019-12-02 DIAGNOSIS — M25569 Pain in unspecified knee: Secondary | ICD-10-CM | POA: Diagnosis not present

## 2019-12-02 NOTE — Telephone Encounter (Signed)
Pt called stating that she was told by [employer] she can not get the knee scooter and state dthat she is having a hard time getting around on the crutches and wanted to know what other options did she have please assist

## 2019-12-02 NOTE — Telephone Encounter (Signed)
Valery:  I just saw this message. Sorry. I'll show him next Wednesday when he gets back.  Thank you, Olegario Messier

## 2019-12-02 NOTE — Telephone Encounter (Signed)
Left message informing pt she could try a rolling walker,that would allow her to balance and occasionally put the surgical boot toe down to balance.

## 2019-12-07 ENCOUNTER — Ambulatory Visit (INDEPENDENT_AMBULATORY_CARE_PROVIDER_SITE_OTHER): Payer: BC Managed Care – PPO

## 2019-12-07 ENCOUNTER — Ambulatory Visit (INDEPENDENT_AMBULATORY_CARE_PROVIDER_SITE_OTHER): Payer: BC Managed Care – PPO | Admitting: Podiatry

## 2019-12-07 ENCOUNTER — Other Ambulatory Visit: Payer: Self-pay

## 2019-12-07 ENCOUNTER — Encounter: Payer: Self-pay | Admitting: Podiatry

## 2019-12-07 VITALS — BP 103/70 | HR 70 | Temp 98.0°F | Resp 16

## 2019-12-07 DIAGNOSIS — M205X2 Other deformities of toe(s) (acquired), left foot: Secondary | ICD-10-CM

## 2019-12-07 DIAGNOSIS — M216X1 Other acquired deformities of right foot: Secondary | ICD-10-CM

## 2019-12-07 DIAGNOSIS — M216X2 Other acquired deformities of left foot: Secondary | ICD-10-CM

## 2019-12-07 DIAGNOSIS — M2012 Hallux valgus (acquired), left foot: Secondary | ICD-10-CM | POA: Diagnosis not present

## 2019-12-07 MED ORDER — OXYCODONE-ACETAMINOPHEN 10-325 MG PO TABS: 1.0000 | ORAL_TABLET | Freq: Four times a day (QID) | ORAL | 0 refills | Status: DC | PRN

## 2019-12-07 NOTE — Progress Notes (Signed)
Subjective:   Patient ID: Mary Gibbs, female   DOB: 34 y.o.   MRN: [id number]   HPI Patient presents stating my pain is reducing but I am still having some discomfort and is having trouble bearing full weight on the heel   ROS      Objective:  Physical Exam  Given her status intact negative Denna Haggard' sign noted incision sites for the posterior plantar and first metatarsal healing well wound edges well coapted with improved motion of the ankle joint secondary to gastroc recession with no numbing-like feelings currently     Assessment:  Doing well post foot and leg surgery     Plan:  H&P reviewed condition and recommended the continuation of immobilization and reapplied sterile dressing.  Advised to continue elevation compression and reappoint 2 weeks suture removal or earlier if needed and did write a prescription for more Percocet for pain especially in the evenings  X-rays indicate osteotomies healing well fixation in place joint congruence and did work on range of motion recommendations for patient

## 2019-12-14 ENCOUNTER — Encounter: Payer: Self-pay | Admitting: Podiatry

## 2019-12-21 ENCOUNTER — Ambulatory Visit (INDEPENDENT_AMBULATORY_CARE_PROVIDER_SITE_OTHER): Payer: BC Managed Care – PPO | Admitting: Podiatry

## 2019-12-21 ENCOUNTER — Ambulatory Visit: Payer: BC Managed Care – PPO

## 2019-12-21 ENCOUNTER — Encounter: Payer: Self-pay | Admitting: Podiatry

## 2019-12-21 ENCOUNTER — Other Ambulatory Visit: Payer: Self-pay

## 2019-12-21 DIAGNOSIS — M722 Plantar fascial fibromatosis: Secondary | ICD-10-CM

## 2019-12-21 DIAGNOSIS — M2012 Hallux valgus (acquired), left foot: Secondary | ICD-10-CM | POA: Diagnosis not present

## 2019-12-26 NOTE — Progress Notes (Signed)
Subjective:   Patient ID: Mary Gibbs, female   DOB: 34 y.o.   MRN: [id number]   HPI Patient presents stating that she has improvement in her foot but still feels like her big toe joint it is not bending the way she wants and she is happy so far with her heel   ROS      Objective:  Physical Exam  Neurovascular status intact with patient's left first MPJ showing moderate restriction of motion with wound edges well coapted and wound edges well coapted heel and posterior calf muscle with good range of motion good muscle strength     Assessment:  Doing well overall with moderate restriction of motion first MPJ left     Plan:  Reviewed condition recommended physical therapy and I am referring for physical therapy discussed gradual increase in activity levels and the importance of continued immobilization.  Reappoint 4 weeks or earlier if needed  X-rays indicate that there is good healing of the osteotomy site first metatarsal with no loss of arch height with fixation in place

## 2020-01-02 DIAGNOSIS — R531 Weakness: Secondary | ICD-10-CM | POA: Diagnosis not present

## 2020-01-02 DIAGNOSIS — M25572 Pain in left ankle and joints of left foot: Secondary | ICD-10-CM | POA: Diagnosis not present

## 2020-01-02 DIAGNOSIS — R262 Difficulty in walking, not elsewhere classified: Secondary | ICD-10-CM | POA: Diagnosis not present

## 2020-01-02 DIAGNOSIS — M25675 Stiffness of left foot, not elsewhere classified: Secondary | ICD-10-CM | POA: Diagnosis not present

## 2020-01-18 ENCOUNTER — Ambulatory Visit (INDEPENDENT_AMBULATORY_CARE_PROVIDER_SITE_OTHER): Payer: BC Managed Care – PPO

## 2020-01-18 ENCOUNTER — Encounter: Payer: Self-pay | Admitting: Podiatry

## 2020-01-18 ENCOUNTER — Ambulatory Visit (INDEPENDENT_AMBULATORY_CARE_PROVIDER_SITE_OTHER): Payer: BC Managed Care – PPO | Admitting: Podiatry

## 2020-01-18 ENCOUNTER — Other Ambulatory Visit: Payer: Self-pay

## 2020-01-18 DIAGNOSIS — M2012 Hallux valgus (acquired), left foot: Secondary | ICD-10-CM

## 2020-01-18 NOTE — Progress Notes (Signed)
Subjective:   Patient ID: Mary Gibbs, female   DOB: 34 y.o.   MRN: [id number]   HPI Patient states she is doing really well with surgery but still gets some swelling and discomfort if she does too much   ROS      Objective:  Physical Exam  Neurovascular status intact negative Denna Haggard' sign noted with patient's left foot doing very well with good motion and no equinus anymore with minimal plantar discomfort excellent range of motion of the first MPJ with no crepitus of the joint     Assessment:  Doing well post forefoot reconstruction left and endoscopic heel along with gastroc recession left      Plan:  H&P reviewed condition and recommended gradual increase in activity continue physical therapy continue stretching exercises and slow return to soft shoe gear.  Patient will be seen back as advised on increasing activity levels and is encouraged to call with questions concerns  X-rays indicate excellent healing of the osteotomy fixation in place joint open and rounded

## 2020-02-21 DIAGNOSIS — R262 Difficulty in walking, not elsewhere classified: Secondary | ICD-10-CM | POA: Diagnosis not present

## 2020-02-21 DIAGNOSIS — M25675 Stiffness of left foot, not elsewhere classified: Secondary | ICD-10-CM | POA: Diagnosis not present

## 2020-02-21 DIAGNOSIS — R531 Weakness: Secondary | ICD-10-CM | POA: Diagnosis not present

## 2020-02-21 DIAGNOSIS — M25572 Pain in left ankle and joints of left foot: Secondary | ICD-10-CM | POA: Diagnosis not present

## 2020-03-12 DIAGNOSIS — M25531 Pain in right wrist: Secondary | ICD-10-CM | POA: Diagnosis not present

## 2020-03-12 DIAGNOSIS — M79644 Pain in right finger(s): Secondary | ICD-10-CM | POA: Diagnosis not present

## 2020-03-12 DIAGNOSIS — G5601 Carpal tunnel syndrome, right upper limb: Secondary | ICD-10-CM | POA: Diagnosis not present

## 2020-03-12 DIAGNOSIS — M65331 Trigger finger, right middle finger: Secondary | ICD-10-CM | POA: Diagnosis not present

## 2020-04-09 ENCOUNTER — Ambulatory Visit: Payer: BC Managed Care – PPO | Admitting: Medical

## 2020-04-09 ENCOUNTER — Encounter: Payer: Self-pay | Admitting: Medical

## 2020-04-09 ENCOUNTER — Other Ambulatory Visit: Payer: Self-pay

## 2020-04-09 VITALS — BP 122/80 | HR 78 | Temp 98.7°F | Ht 62.0 in | Wt 209.2 lb

## 2020-04-09 DIAGNOSIS — Z113 Encounter for screening for infections with a predominantly sexual mode of transmission: Secondary | ICD-10-CM | POA: Diagnosis not present

## 2020-04-09 DIAGNOSIS — Z0000 Encounter for general adult medical examination without abnormal findings: Secondary | ICD-10-CM

## 2020-04-09 DIAGNOSIS — Z1239 Encounter for other screening for malignant neoplasm of breast: Secondary | ICD-10-CM | POA: Diagnosis not present

## 2020-04-09 DIAGNOSIS — F40243 Fear of flying: Secondary | ICD-10-CM | POA: Insufficient documentation

## 2020-04-09 DIAGNOSIS — Z7185 Encounter for immunization safety counseling: Secondary | ICD-10-CM | POA: Insufficient documentation

## 2020-04-09 DIAGNOSIS — Z13220 Encounter for screening for lipoid disorders: Secondary | ICD-10-CM

## 2020-04-09 DIAGNOSIS — F419 Anxiety disorder, unspecified: Secondary | ICD-10-CM | POA: Insufficient documentation

## 2020-04-09 LAB — LIPID PANEL

## 2020-04-09 MED ORDER — ALPRAZOLAM 0.5 MG PO TABS
0.5000 mg | ORAL_TABLET | Freq: Every evening | ORAL | 0 refills | Status: DC | PRN
Start: 2020-04-09 — End: 2020-08-10

## 2020-04-09 NOTE — Addendum Note (Signed)
Addended by: Victorio Palm on: 04/09/2020 01:29 PM   Modules accepted: Orders

## 2020-04-09 NOTE — Progress Notes (Signed)
Referral sent 

## 2020-04-09 NOTE — Progress Notes (Signed)
Subjective:   HPI  Mary Gibbs is a 34 y.o. female who presents for Chief Complaint  Patient presents with  . Annual Exam    CPE lt. shaking a lot when traveling on plane     Patient Care Team: Raffaella Edison, Cleda Mccreedy as PCP - General (Family Medicine) Sees dentist Sees eye doctor Ob/Gyn - prior in [place], [place] Dr. Betha Loa, orthopedics Dr. Linna Hoff, podiatry  Concerns: Had surgery 11/2019.  Involved left foot and lower leg, regarding broken toe, plantar fascitis.  Cousin passed away in 2023/03/10 at 35yo.   This scared her.   Not exactly sure what happened to her, so she was wanting physical.     Reviewed their medical, surgical, family, social, medication, and allergy history and updated chart as appropriate.  Past Medical History:  Diagnosis Date  . Anemia    @ last [facility] appt  . Depression    Zoloft in the past  . H/O hiatal hernia   . Hx of ulcer disease   . Hx of varicella   . NSVD (normal spontaneous vaginal delivery) 08/26/2012    Family History  Problem Relation Age of Onset  . Epilepsy Sister   . Depression Sister   . Bipolar disorder Sister   . Hypertension Mother   . Tuberculosis Mother        as a child  . Diabetes Mother        diet controlled  . Depression Mother   . Colon cancer Paternal Grandfather   . Cancer Paternal Grandfather        prostate  . Breast cancer Maternal Aunt        x 3;after menopause  . Diabetes Maternal Grandmother   . Hypertension Maternal Grandmother   . Stomach cancer Paternal Grandmother   . Heart disease Neg Hx      Current Outpatient Medications:  .  BLACK CURRANT SEED OIL PO, Take 1 capsule by mouth daily., Disp: , Rfl:  .  Probiotic Product (ALIGN) 4 MG CAPS, Take 1 capsule (4 mg total) by mouth daily., Disp: 30 capsule, Rfl: 2 .  ALPRAZolam (XANAX) 0.5 MG tablet, Take 1 tablet (0.5 mg total) by mouth at bedtime as needed for anxiety., Disp: 15 tablet, Rfl: 0  No Known Allergies    Review of  Systems Constitutional: -fever, -chills, -sweats, -unexpected weight change, -decreased appetite, -fatigue Allergy: -sneezing, -itching, -congestion Dermatology: -changing moles, --rash, -lumps ENT: -runny nose, -ear pain, -sore throat, -hoarseness, -sinus pain, -teeth pain, - ringing in ears, -hearing loss, -nosebleeds Cardiology: -chest pain, -palpitations, -swelling, -difficulty breathing when lying flat, -waking up short of breath Respiratory: -cough, -shortness of breath, -difficulty breathing with exercise or exertion, -wheezing, -coughing up blood Gastroenterology: -abdominal pain, -nausea, -vomiting, -diarrhea, -constipation, -blood in stool, -changes in bowel movement, -difficulty swallowing or eating Hematology: -bleeding, -bruising  Musculoskeletal: -joint aches, -muscle aches, -joint swelling, -back pain, -neck pain, -cramping, -changes in gait Ophthalmology: denies vision changes, eye redness, itching, discharge Urology: -burning with urination, -difficulty urinating, -blood in urine, -urinary frequency, -urgency, -incontinence Neurology: -headache, -weakness, -tingling, -numbness, -memory loss, -falls, -dizziness Psychology: -depressed mood, -agitation, -sleep problems Breast/gyn: -breast tendnerss, -discharge, -lumps, -vaginal discharge,- irregular periods, -heavy periods     Objective:  BP 122/80   Pulse 78   Temp 98.7 F (37.1 C)   Ht 5\' 2"  (1.575 m)   Wt 209 lb 3.2 oz (94.9 kg)   LMP  (LMP Unknown)   BMI 38.26 kg/m  Wt Readings from Last 3 Encounters:  04/09/20 209 lb 3.2 oz (94.9 kg)  03/29/18 236 lb 6.4 oz (107.2 kg)  06/11/17 216 lb 7.9 oz (98.2 kg)    General appearance: alert, no distress, WD/WN, African American female Skin: unremarkable, tattoo low back HEENT: normocephalic, conjunctiva/corneas normal, sclerae anicteric, PERRLA, EOMi, nares patent, no discharge or erythema, pharynx normal Oral cavity: MMM, tongue normal, teeth normal Neck: supple, no  lymphadenopathy, no thyromegaly, no masses, normal ROM, no bruits Chest: non tender, normal shape and expansion Heart: RRR, normal S1, S2, no murmurs Lungs: CTA bilaterally, no wheezes, rhonchi, or rales Abdomen: +bs, soft, non tender, non distended, no masses, no hepatomegaly, no splenomegaly, no bruits Back: non tender, normal ROM, no scoliosis Musculoskeletal: upper extremities non tender, no obvious deformity, normal ROM throughout, lower extremities non tender, no obvious deformity, normal ROM throughout Extremities: no edema, no cyanosis, no clubbing Pulses: 2+ symmetric, upper and lower extremities, normal cap refill Neurological: alert, oriented x 3, CN2-12 intact, strength normal upper extremities and lower extremities, sensation normal throughout, DTRs 2+ throughout, no cerebellar signs, gait normal Psychiatric: normal affect, behavior normal, pleasant  Breast/gyn/rectal - deferred to gynecology    Assessment and Plan :   Encounter Diagnoses  Name Primary?  . Encounter for health maintenance examination in adult Yes  . Vaccine counseling   . Encounter for screening for malignant neoplasm of breast, unspecified screening modality   . Fear of flying   . Anxiety   . Screen for STD (sexually transmitted disease)   . Screening for lipid disorders       Today you had a preventative care visit or wellness visit.    Topics today may have included healthy lifestyle, diet, exercise, preventative care, vaccinations, sick and well care, proper use of emergency dept and after hours care, as well as other concerns.     Recommendations: Continue to return yearly for your annual wellness and preventative care visits.  This gives [place] a chance to discuss healthy lifestyle, exercise, vaccinations, review your chart record, and perform screenings where appropriate.  I recommend you see your eye doctor yearly for routine vision care.  I recommend you see your dentist yearly for routine dental  care including hygiene visits twice yearly.   Vaccination recommendations were reviewed You are up to date on your first covid vaccine and recent flu shot. Plan to get your 2nd covid vaccine as scheduled   Screening for cancer: Breast cancer screening: You should perform a self breast exam monthly.   We reviewed recommendations for regular mammograms and breast cancer screening.  Colon cancer screening:  Starting age 32yo  Cervical cancer screening: We will refer you to gynecology  Skin cancer screening: Check your skin regularly for new changes, growing lesions, or other lesions of concern Come in for evaluation if you have skin lesions of concern.  Lung cancer screening: If you have a greater than 30 pack year history of tobacco use, then you qualify for lung cancer screening with a chest CT scan  We currently don't have screenings for other cancers besides breast, cervical, colon, and lung cancers.  If you have a strong family history of cancer or have other cancer screening concerns, please let me know.    Bone health: Get at least 150 minutes of aerobic exercise weekly Get weight bearing exercise at least once weekly   Heart health: Get at least 150 minutes of aerobic exercise weekly Limit alcohol It is important to maintain a  healthy blood pressure and healthy cholesterol numbers You had a baseline normal EKG 2019.   Screening for sexually transmitted infections: Labs today for screening   Separate significant issues discussed: Flight anxiety - can use xanax prn for flights.  Discussed proper use  Anxiety - advised counseling.     Mary Gibbs was seen today for annual exam.  Diagnoses and all orders for this visit:  Encounter for health maintenance examination in adult -     Comprehensive metabolic panel -     CBC with Differential/Platelet -     Lipid panel -     HIV Antibody (routine testing w rflx) -     RPR -     GC/Chlamydia Probe Amp -      Hepatitis C antibody -     Hepatitis B surface antigen -     Hepatitis B surface antibody,quantitative  Vaccine counseling  Encounter for screening for malignant neoplasm of breast, unspecified screening modality  Fear of flying  Anxiety  Screen for STD (sexually transmitted disease) -     HIV Antibody (routine testing w rflx) -     RPR -     GC/Chlamydia Probe Amp -     Hepatitis C antibody -     Hepatitis B surface antigen -     Hepatitis B surface antibody,quantitative  Screening for lipid disorders -     Lipid panel  Other orders -     ALPRAZolam (XANAX) 0.5 MG tablet; Take 1 tablet (0.5 mg total) by mouth at bedtime as needed for anxiety.    Follow-up pending labs, yearly for physical  Return in about 1 year (around 04/09/2021).

## 2020-04-09 NOTE — Patient Instructions (Signed)
Preventative Care for Adults - Female   Thank you for coming in for your well visit today, and thank you for trusting [place] with your care!  If you had a good experience today, please complete the surveys sent by [facility] and consider a review online such as [employer] or Health Grades, refer [place] to a friend  Specific Concerns: Begin Xanax as needed before plane flight  Today you had a preventative care visit or wellness visit.    Topics today may have included healthy lifestyle, diet, exercise, preventative care, vaccinations, sick and well care, proper use of emergency dept and after hours care, as well as other concerns.     Recommendations: Continue to return yearly for your annual wellness and preventative care visits.  This gives [place] a chance to discuss healthy lifestyle, exercise, vaccinations, review your chart record, and perform screenings where appropriate.  I recommend you see your eye doctor yearly for routine vision care.  I recommend you see your dentist yearly for routine dental care including hygiene visits twice yearly.   Vaccination recommendations were reviewed You are up to date on your first covid vaccine and recent flu shot. Plan to get your 2nd covid vaccine as scheduled   Screening for cancer: Breast cancer screening: You should perform a self breast exam monthly.   We reviewed recommendations for regular mammograms and breast cancer screening.  Colon cancer screening:  Starting age 20yo  Cervical cancer screening: We will refer you to gynecology  Skin cancer screening: Check your skin regularly for new changes, growing lesions, or other lesions of concern Come in for evaluation if you have skin lesions of concern.  Lung cancer screening: If you have a greater than 30 pack year history of tobacco use, then you qualify for lung cancer screening with a chest CT scan  We currently don't have screenings for other cancers besides breast, cervical, colon, and  lung cancers.  If you have a strong family history of cancer or have other cancer screening concerns, please let me know.    Bone health: Get at least 150 minutes of aerobic exercise weekly Get weight bearing exercise at least once weekly   Heart health: Get at least 150 minutes of aerobic exercise weekly Limit alcohol It is important to maintain a healthy blood pressure and healthy cholesterol numbers You had a baseline normal EKG 2019.   Screening for sexually transmitted infections: Labs today for screening     Maintain regular health and wellness exams:  A routine yearly physical is a good way to check in with your primary care provider about your health and preventive screening. It is also an opportunity to share updates about your health and any concerns you have, and receive a thorough all-over exam.   Most health insurance companies pay for at least some preventative services.  Check with your health plan for specific coverages.  What preventative services do women need?  Adult women should have their weight and blood pressure checked regularly.   Women age 100 and older should have their cholesterol levels checked regularly.  Women should be screened for cervical cancer with a Pap smear and pelvic exam beginning at either age 40, or 3 years after they become sexually activity.    Breast cancer screening generally begins at age 62 with a mammogram and breast exam by your primary care provider.    Beginning at age 45 and continuing to age 43, women should be screened for colorectal cancer.  Certain  people may need continued testing until age 35.  Updating vaccinations is part of preventative care.  Vaccinations help protect against diseases such as the flu.  Osteoporosis is a disease in which the bones lose minerals and strength as we age. Women ages 7165 and over should discuss this with their caregivers, as should women after menopause who have other risk  factors.  Lab tests are generally done as part of preventative care to screen for anemia and blood disorders, to screen for problems with the kidneys and liver, to screen for bladder problems, to check blood sugar, and to check your cholesterol level.  Preventative services generally include counseling about diet, exercise, avoiding tobacco, drugs, excessive alcohol consumption, and sexually transmitted infections.    Xrays and CT scans are not normally done as a preventative test, and most insurances do not pay for imaging for screening other than as discussed under cancer screens below.   On the other hand, if you have certain medical concerns, imaging may be necessary as a diagnostic test.   Your Medical Team Your medical team starts with us, your PCP or primary care provider.  Please use our services for your routine care such as physicals, screenings, immunizations, sick visits, and your first stop for general medical concerns.  You can call our number for after hours information for urgent questions that may need attention but cannot wait til the next business day.    Urgent care-urgent cares exist to provide care when your primary care office would typically be closed such as evenings or weekends.   Urgent care is for evaluation of urgent medical problems that do not necessarily require emergency department care, but cannot wait til the next business day when we are open.  Emergency department care-please reserve emergency department care for serious, urgent, possibly life-threatening medical problems.  This includes issues like possible stroke, heart attack, significant injury, mental health crisis, or other urgent need that requires immediate medical attention.     See your dentist office twice yearly for hygiene and cleaning visits.   Brush your teeth and floss your teeth daily.  See your eye doctor yearly for routine eye exam and screenings for glaucoma and retinal disease.  See your  gynecologist yearly if you do not have your female/gynecological exams at our office.     Vaccines:  Stay up to date with your tetanus shots and other required immunizations. You should have a booster for tetanus every 10 years. Be sure to get your flu shot every year, since 5%-20% of the [place] population comes down with the flu. The flu vaccine changes each year, so being vaccinated once is not enough. Get your shot in the fall, before the flu season peaks.   Other vaccines to consider:  Pneumococcal vaccine to protect against certain types of pneumonia.  This is normally recommended for adults age 35 or older.  However, adults younger than 35 years old with certain underlying conditions such as diabetes, heart or lung disease should also receive the vaccine.  Shingles vaccine to protect against Varicella Zoster if you are older than age 35, or younger than 35 years old with certain underlying illness.  If you have not had the Shingrix vaccine, please call your insurer to inquire about coverage for the Shingrix vaccine given in 2 doses.   Some insurers cover this vaccine after age 35, some cover this after age 35.  If your insurer covers this, then call to schedule appointment to have this vaccine  here  Hepatitis A vaccine to protect against a form of infection of the liver by a virus acquired from food.  Hepatitis B vaccine to protect against a form of infection of the liver by a virus acquired from blood or body fluids, particularly if you work in health care.  If you plan to travel internationally, check with your local health department for specific vaccination recommendations.  Human Papilloma Virus or HPV causes cancer of the cervix, and other infections that can be transmitted from person to person. There is a vaccine for HPV, and males should get immunized between the ages of 35 and 34. It requires a series of 3 shots.   Covid/Coronavirus - Please consider vaccination for your benefit and  to help prevent spread of Covid to those around you.       What should I know about Cancer screening? Many types of cancers can be detected early and may often be prevented. Lung Cancer  You should be screened every year for lung cancer if: ? You are a current smoker who has smoked for at least 30 years. ? You are a former smoker who has quit within the past 15 years.  Talk to your health care provider about your screening options, when you should start screening, and how often you should be screened.  Breast cancer screening is essential to preventive care for women. All women age 26 and older should perform a breast self-exam every month. At age 25 and older, women should have their caregiver complete a breast exam each year. Women at ages 9 and older should have a mammogram (x-ray film) of the breasts. Your caregiver can discuss how often you need mammograms.    Breast cancer screening   [facility]   Or    [facility]   [phone]         [phone] [address]       [address] [place], [place] [address]        [place], [place] [address]  Cervical cancer screening includes taking a Pap smear (sample of cells examined under a microscope) from the cervix (end of the uterus). It also includes testing for HPV (Human Papilloma Virus, which can cause cervical cancer). Screening and a pelvic exam should begin at age 11, or 3 years after a woman becomes sexually active. Screening should occur every year, with a Pap smear but no HPV testing, up to age 60. After age 16, you should have a Pap smear every 3 years with HPV testing, if no HPV was found previously.   Colorectal Cancer  Routine colorectal cancer screening usually begins at 35 years of age and should be repeated every 5-10 years until you are 35 years old. You may need to be screened more often if early forms of precancerous polyps or small growths are found. Your  health care provider may recommend screening at an earlier age if you have risk factors for colon cancer.  Your health care provider may recommend using home test kits to check for hidden blood in the stool.  A small camera at the end of a tube can be used to examine your colon (sigmoidoscopy or colonoscopy). This checks for the earliest forms of colorectal cancer.  Skin Cancer  Check your skin from head to toe regularly.  Tell your health care provider about any new moles or changes in moles, especially if: ? There is a change in a  mole's size, shape, or color. ? You have a mole that is larger than a pencil eraser.  Always use sunscreen. Apply sunscreen liberally and repeat throughout the day.  Protect yourself by wearing long sleeves, pants, a wide-brimmed hat, and sunglasses when outside.     GENERAL RECOMMENDATIONS FOR GOOD HEALTH:  Healthy diet:  Eat a variety of foods, including fruit, vegetables, animal or vegetable protein, such as meat, fish, chicken, and eggs, or beans, lentils, tofu, and grains, such as rice.  Drink plenty of water daily.  Decrease saturated fat in the diet, avoid lots of red meat, processed foods, sweets, fast foods, and fried foods.  Exercise:  Aerobic exercise helps maintain good heart health. At least 30-40 minutes of moderate-intensity exercise is recommended. For example, a brisk walk that increases your heart rate and breathing. This should be done on most days of the week.   Find a type of exercise or a variety of exercises that you enjoy so that it becomes a part of your daily life.  Examples are running, walking, swimming, water aerobics, and biking.  For motivation and support, explore group exercise such as aerobic class, spin class, Zumba, Yoga,or  martial arts, etc.    Set exercise goals for yourself, such as a certain weight goal, walk or run in a race such as a 5k walk/run.  Speak to your primary care provider about exercise  goals.    Disease prevention:  If you smoke or chew tobacco, find out from your caregiver how to quit. It can literally save your life, no matter how long you have been a tobacco user. If you do not use tobacco, never begin.   Maintain a healthy diet and normal weight. Increased weight leads to problems with blood pressure and diabetes.   The Body Mass Index or BMI is a way of measuring how much of your body is fat. Having a BMI above 27 increases the risk of heart disease, diabetes, hypertension, stroke and other problems related to obesity. Your caregiver can help determine your BMI and based on it develop an exercise and dietary program to help you achieve or maintain this important measurement at a healthful level.  High blood pressure causes heart and blood vessel problems.  Persistent high blood pressure should be treated with medicine if weight loss and exercise do not work.    Fat and cholesterol leaves deposits in your arteries that can block them. This causes heart disease and vessel disease elsewhere in your body.  If your cholesterol is found to be high, or if you have heart disease or certain other medical conditions, then you may need to have your cholesterol monitored frequently and be treated with medication.   Ask if you should have a cardiac stress test if your history suggests this. A stress test is a test done on a treadmill that looks for heart disease. This test can find disease prior to there being a problem.  Menopause can be associated with physical symptoms and risks. Hormone replacement therapy is available to decrease these. You should talk to your caregiver about whether starting or continuing to take hormones is right for you.   Osteoporosis is a disease in which the bones lose minerals and strength as we age. This can result in serious bone fractures. Risk of osteoporosis can be identified using a bone density scan. Women ages 60 and over should discuss this with  their caregivers, as should women after menopause who have other risk factors.  Ask your caregiver whether you should be taking a calcium supplement and Vitamin D, to reduce the rate of osteoporosis.   Osteoporosis screening/bone density testing:  [facility]   Or    [facility]   [phone]          [phone] [address]       [address] [place], [place] [address]        [place], [place] [address]    Avoid drinking alcohol in excess (more than two drinks per day).  Avoid use of street drugs. Do not share needles with anyone. Ask for professional help if you need assistance or instructions on stopping the use of alcohol, cigarettes, and/or drugs.  Brush your teeth twice a day with fluoride toothpaste, and floss once a day. Good oral hygiene prevents tooth decay and gum disease. The problems can be painful, unattractive, and can cause other health problems. Visit your dentist for a routine oral and dental check up and preventive care every 6-12 months.    Spiritual and Emotional Health Keeping a healthy spiritual life can help you better manage your physical health. Your spiritual life can help you to cope with any issues that may arise with your physical health.  Balance can keep [place] healthy and help [place] to recover.  If you are struggling with your spiritual health there are questions that you may want to ask yourself:  What makes me feel most complete? When do I feel most connected to the rest of the world? Where do I find the most inner strength? What am I doing when I feel whole?  Helpful tips: . Being in nature. Some people feel very connected and at peace when they are walking outdoors or are outside. Marland Kitchen Helping others. Some feel the largest sense of wellbeing when they are of service to others. Being of service can take on many forms. It can be doing volunteer work, being kind to strangers, or offering a hand to a  friend in need. . Gratitude. Some people find they feel the most connected when they remain grateful. They may make lists of all the things they are grateful for or say a thank you out loud for all they have.    Emotional Health Are you in tune with your emotional health?  Check out this link: http://www.marquez-love.com/    Legal  Take the time to do a last will and testament, Advanced Directives including Health Care Power of Attorney and Living Will documents.  Don't leave your family with burdens that can be handled ahead of time.   Financial Health . Make sure you use a budget for your personal finances . Make sure you are insured against risks (health insurance, life insurance, auto insurance, etc) . Save more, spend less . Set financial goals . If you need help in this area, good resources include counseling through [employer] or other community resources, have a meeting with a Social research officer, government, and a good resource is [employer] 10 reasons people come to the doctor's office:   (what is your "ounce of prevention")  Skin disorders; Osteoarthritis and joint disorders; Back problems; Cholesterol problems; Upper respiratory conditions, excluding asthma; Anxiety, depression, and bipolar disorder; Chronic neurologic disorders; High blood pressure; Headaches and migraines; and Diabetes.    Safety:  Use seatbelts 100% of the time, whether driving or as a passenger.  Use safety devices such as hearing  protection if you work in environments with loud noise or significant background noise.  Use safety glasses when doing any work that could send debris in to the eyes.  Use a helmet if you ride a bike or motorcycle.  Use appropriate safety gear for contact sports.  Talk to your caregiver about gun safety.  Use sunscreen with a SPF (or skin protection factor) of 15 or greater.  Lighter skinned people are at a greater risk of skin cancer. Don't  forget to also wear sunglasses in order to protect your eyes from too much damaging sunlight. Damaging sunlight can accelerate cataract formation.   Keep carbon monoxide and smoke detectors in your home functioning at all times. Change the batteries every 6 months or use a model that plugs into the wall.    Sexual activity: . Sex is a normal part of life and sexual activity can continue into older adulthood for many healthy people.   . If you are having issues related to sexual activity, please follow up to discuss this further.   . If you are not in a monogamous relationship or have more than one partner, please practice safe sex.  Use condoms. Condoms are used for birth control and to help reduce the spread of sexually transmitted infections (or STIs).  Some of the STIs are gonorrhea (the clap), chlamydia, syphilis, trichomonas, herpes, HPV (human papilloma virus) and HIV (human immunodeficiency virus) which causes AIDS. The herpes, HIV and HPV are viral illnesses that have no cure. These can result in disability, cancer and death.   We are able to test for STIs here at our office.

## 2020-04-10 LAB — LIPID PANEL
Chol/HDL Ratio: 3.4 ratio (ref 0.0–4.4)
Cholesterol, Total: 215 mg/dL — ABNORMAL HIGH (ref 100–199)
HDL: 63 mg/dL
LDL Chol Calc (NIH): 141 mg/dL — ABNORMAL HIGH (ref 0–99)
Triglycerides: 65 mg/dL (ref 0–149)
VLDL Cholesterol Cal: 11 mg/dL (ref 5–40)

## 2020-04-10 LAB — COMPREHENSIVE METABOLIC PANEL
ALT: 13 IU/L (ref 0–32)
AST: 17 IU/L (ref 0–40)
Albumin/Globulin Ratio: 1.2 (ref 1.2–2.2)
Albumin: 4.5 g/dL (ref 3.8–4.8)
Alkaline Phosphatase: 68 IU/L (ref 44–121)
BUN/Creatinine Ratio: 15 (ref 9–23)
BUN: 11 mg/dL (ref 6–20)
Bilirubin Total: 0.3 mg/dL (ref 0.0–1.2)
CO2: 24 mmol/L (ref 20–29)
Calcium: 9.9 mg/dL (ref 8.7–10.2)
Chloride: 100 mmol/L (ref 96–106)
Creatinine, Ser: 0.74 mg/dL (ref 0.57–1.00)
GFR calc Af Amer: 121 mL/min/{1.73_m2} (ref >59)
GFR calc non Af Amer: 105 mL/min/{1.73_m2} (ref >59)
Globulin, Total: 3.8 g/dL (ref 1.5–4.5)
Glucose: 98 mg/dL (ref 65–99)
Potassium: 4.8 mmol/L (ref 3.5–5.2)
Sodium: 138 mmol/L (ref 134–144)
Total Protein: 8.3 g/dL (ref 6.0–8.5)

## 2020-04-10 LAB — HEPATITIS C ANTIBODY: Hep C Virus Ab: 0.2 s/co ratio (ref 0.0–0.9)

## 2020-04-10 LAB — HEPATITIS B SURFACE ANTIGEN: Hepatitis B Surface Ag: NEGATIVE

## 2020-04-10 LAB — CBC WITH DIFFERENTIAL/PLATELET
Basophils Absolute: 0 10*3/uL (ref 0.0–0.2)
Basos: 0 %
EOS (ABSOLUTE): 0.1 10*3/uL (ref 0.0–0.4)
Eos: 1 %
Hematocrit: 40.8 % (ref 34.0–46.6)
Hemoglobin: 13.3 g/dL (ref 11.1–15.9)
Immature Grans (Abs): 0 10*3/uL (ref 0.0–0.1)
Immature Granulocytes: 0 %
Lymphocytes Absolute: 2.3 10*3/uL (ref 0.7–3.1)
Lymphs: 25 %
MCH: 31 pg (ref 26.6–33.0)
MCHC: 32.6 g/dL (ref 31.5–35.7)
MCV: 95 fL (ref 79–97)
Monocytes Absolute: 0.4 10*3/uL (ref 0.1–0.9)
Monocytes: 4 %
Neutrophils Absolute: 6.3 10*3/uL (ref 1.4–7.0)
Neutrophils: 70 %
Platelets: 339 10*3/uL (ref 150–450)
RBC: 4.29 x10E6/uL (ref 3.77–5.28)
RDW: 11.7 % (ref 11.7–15.4)
WBC: 9.2 10*3/uL (ref 3.4–10.8)

## 2020-04-10 LAB — GC/CHLAMYDIA PROBE AMP
Chlamydia trachomatis, NAA: NEGATIVE
Neisseria Gonorrhoeae by PCR: NEGATIVE

## 2020-04-10 LAB — HIV ANTIBODY (ROUTINE TESTING W REFLEX): HIV Screen 4th Generation wRfx: NONREACTIVE

## 2020-04-10 LAB — HEPATITIS B SURFACE ANTIBODY, QUANTITATIVE: Hepatitis B Surf Ab Quant: 69.6 m[IU]/mL (ref >9.9)

## 2020-04-10 LAB — SYPHILIS: RPR W/REFLEX TO RPR TITER AND TREPONEMAL ANTIBODIES, TRADITIONAL SCREENING AND DIAGNOSIS ALGORITHM: RPR Ser Ql: NONREACTIVE

## 2020-05-23 DIAGNOSIS — F4323 Adjustment disorder with mixed anxiety and depressed mood: Secondary | ICD-10-CM | POA: Diagnosis not present

## 2020-05-30 DIAGNOSIS — F4323 Adjustment disorder with mixed anxiety and depressed mood: Secondary | ICD-10-CM | POA: Diagnosis not present

## 2020-06-02 NOTE — L&D Delivery Note (Signed)
Delivery Note At 4:00 AM a viable and healthy female was delivered via Vaginal, Spontaneous (Presentation:  , VTX Occiput Posterior).  APGAR: 9, 9; weight pending  .   Placenta status: Spontaneous, Intact. Not sent Cord:  marginal insertion 3 vessels with the following complications: None.  Cord pH: none  Anesthesia: Epidural Episiotomy: None Lacerations:  1st degree perineal Suture Repair: 3.0 chromic Est. Blood Loss (mL):  150  Mom to postpartum.  Baby to Couplet care / Skin to Skin.  Mary Gibbs [name] 01/23/2021, 4:47 AM

## 2020-06-07 DIAGNOSIS — F4323 Adjustment disorder with mixed anxiety and depressed mood: Secondary | ICD-10-CM | POA: Diagnosis not present

## 2020-06-11 DIAGNOSIS — N912 Amenorrhea, unspecified: Secondary | ICD-10-CM | POA: Diagnosis not present

## 2020-06-12 ENCOUNTER — Other Ambulatory Visit: Payer: Self-pay

## 2020-06-12 ENCOUNTER — Encounter: Payer: Self-pay | Admitting: Medical

## 2020-06-12 ENCOUNTER — Ambulatory Visit: Payer: BC Managed Care – PPO | Admitting: Medical

## 2020-06-12 VITALS — BP 112/72 | Ht 62.0 in | Wt 212.0 lb

## 2020-06-12 DIAGNOSIS — R8290 Unspecified abnormal findings in urine: Secondary | ICD-10-CM | POA: Diagnosis not present

## 2020-06-12 DIAGNOSIS — Z3201 Encounter for pregnancy test, result positive: Secondary | ICD-10-CM | POA: Diagnosis not present

## 2020-06-12 DIAGNOSIS — F341 Dysthymic disorder: Secondary | ICD-10-CM | POA: Diagnosis not present

## 2020-06-12 LAB — POCT URINALYSIS DIP (PROADVANTAGE DEVICE)
Bilirubin, UA: NEGATIVE
Glucose, UA: NEGATIVE mg/dL
Ketones, POC UA: NEGATIVE mg/dL
Leukocytes, UA: NEGATIVE
Nitrite, UA: NEGATIVE
Protein Ur, POC: NEGATIVE mg/dL
Specific Gravity, Urine: 1.015
Urobilinogen, Ur: 0.2
pH, UA: 6 (ref 5.0–8.0)

## 2020-06-12 NOTE — Addendum Note (Signed)
Addended by: Jac Canavan on: 06/12/2020 02:19 PM   Modules accepted: Orders
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
## 2020-06-13 DIAGNOSIS — N83292 Other ovarian cyst, left side: Secondary | ICD-10-CM | POA: Diagnosis not present

## 2020-06-13 DIAGNOSIS — Z3200 Encounter for pregnancy test, result unknown: Secondary | ICD-10-CM | POA: Diagnosis not present

## 2020-06-13 DIAGNOSIS — F32A Depression, unspecified: Secondary | ICD-10-CM | POA: Diagnosis not present

## 2020-06-15 ENCOUNTER — Other Ambulatory Visit: Payer: Self-pay | Admitting: Medical

## 2020-06-15 LAB — URINE CULTURE

## 2020-06-15 MED ORDER — CEPHALEXIN 500 MG PO CAPS: 500.0000 mg | ORAL_CAPSULE | Freq: Two times a day (BID) | ORAL | 0 refills | Status: AC

## 2020-07-12 DIAGNOSIS — O09521 Supervision of elderly multigravida, first trimester: Secondary | ICD-10-CM | POA: Diagnosis not present

## 2020-07-18 DIAGNOSIS — N83292 Other ovarian cyst, left side: Secondary | ICD-10-CM | POA: Diagnosis not present

## 2020-07-18 DIAGNOSIS — O09521 Supervision of elderly multigravida, first trimester: Secondary | ICD-10-CM | POA: Diagnosis not present

## 2020-08-02 DIAGNOSIS — N83292 Other ovarian cyst, left side: Secondary | ICD-10-CM | POA: Diagnosis not present

## 2020-08-02 DIAGNOSIS — O09521 Supervision of elderly multigravida, first trimester: Secondary | ICD-10-CM | POA: Diagnosis not present

## 2020-08-02 DIAGNOSIS — O2340 Unspecified infection of urinary tract in pregnancy, unspecified trimester: Secondary | ICD-10-CM | POA: Diagnosis not present

## 2020-08-10 ENCOUNTER — Other Ambulatory Visit: Payer: Self-pay

## 2020-08-10 ENCOUNTER — Inpatient Hospital Stay (HOSPITAL_COMMUNITY)
Admission: AD | Admit: 2020-08-10 | Discharge: 2020-08-10 | Disposition: A | Discharge status: Home / Self Care | Payer: BC Managed Care – PPO | Attending: Obstetrics and Gynecology | Admitting: Obstetrics and Gynecology

## 2020-08-10 ENCOUNTER — Inpatient Hospital Stay (HOSPITAL_COMMUNITY): Payer: BC Managed Care – PPO

## 2020-08-10 ENCOUNTER — Encounter (HOSPITAL_COMMUNITY): Payer: Self-pay | Admitting: Obstetrics and Gynecology

## 2020-08-10 DIAGNOSIS — O209 Hemorrhage in early pregnancy, unspecified: Secondary | ICD-10-CM | POA: Insufficient documentation

## 2020-08-10 DIAGNOSIS — Z3A14 14 weeks gestation of pregnancy: Secondary | ICD-10-CM | POA: Diagnosis not present

## 2020-08-10 DIAGNOSIS — Z87891 Personal history of nicotine dependence: Secondary | ICD-10-CM | POA: Diagnosis not present

## 2020-08-10 DIAGNOSIS — F32A Depression, unspecified: Secondary | ICD-10-CM | POA: Diagnosis not present

## 2020-08-10 DIAGNOSIS — O99342 Other mental disorders complicating pregnancy, second trimester: Secondary | ICD-10-CM | POA: Diagnosis not present

## 2020-08-10 DIAGNOSIS — O09522 Supervision of elderly multigravida, second trimester: Secondary | ICD-10-CM | POA: Insufficient documentation

## 2020-08-10 DIAGNOSIS — O4692 Antepartum hemorrhage, unspecified, second trimester: Secondary | ICD-10-CM

## 2020-08-10 DIAGNOSIS — O208 Other hemorrhage in early pregnancy: Secondary | ICD-10-CM | POA: Diagnosis not present

## 2020-08-10 HISTORY — DX: Unspecified infectious disease: B99.9

## 2020-08-10 MED ORDER — SERTRALINE HCL 50 MG PO TABS: 50.0000 mg | ORAL_TABLET | Freq: Every day | ORAL | 11 refills | Status: DC

## 2020-08-10 NOTE — MAU Provider Note (Signed)
History     Chief Complaint  Patient presents with   Vaginal Bleeding   35 yo [id number] BF @ 14 1/[redacted] wks gestation presents with complaint of one episode of brown vaginal spot today. Pt denies abdominal cramping or recent intercourse. Pt  reports depression but denies suicidal ideation. Pt has  Previous hx of depression but has been off med  OB History     Gravida  5   Para  2   Term  2   Preterm      AB  2   Living  2      SAB      IAB  2   Ectopic      Multiple      Live Births  2           Past Medical History:  Diagnosis Date   Anemia    @ last [facility] appt   Depression     no meds currently, was thinking of going to behavior health   H/O hiatal hernia    Hx of ulcer disease    Hx of varicella    Infection    UTI   NSVD (normal spontaneous vaginal delivery) 08/26/2012    Past Surgical History:  Procedure Laterality Date   CHOLECYSTECTOMY N/A 06/11/2017   Procedure: LAPAROSCOPIC CHOLECYSTECTOMY WITH INTRAOPERATIVE CHOLANGIOGRAM;  Surgeon: Manus Rudd, MD;  Location: [facility] OR;  Service: General;  Laterality: N/A;   FOOT SURGERY Left    broken toe, ligament removed from back of leg   TONSILLECTOMY     As a baby    Family History  Problem Relation Age of Onset   Epilepsy Sister    Depression Sister    Bipolar disorder Sister    Hypertension Mother    Tuberculosis Mother        as a child   Diabetes Mother        diet controlled   Depression Mother    Cancer Father        prostate   Colon cancer Paternal Grandfather    Cancer Paternal Grandfather        prostate   Breast cancer Maternal Aunt        x 3;after menopause   Diabetes Maternal Grandmother    Hypertension Maternal Grandmother    Stomach cancer Paternal Grandmother    Heart disease Neg Hx     Social History   Tobacco Use   Smoking status: Former Smoker    Packs/day: 0.50    Years: 4.00    Pack years: 2.00    Types: Cigarettes    Quit date: 04/21/2016    Years since  quitting: 4.3   Smokeless tobacco: Never Used  Vaping Use   Vaping Use: Never used  Substance Use Topics   Alcohol use: Not Currently    Alcohol/week: 2.0 standard drinks    Types: 1 Glasses of wine, 1 Shots of liquor per week    Comment: occasionally   Drug use: No    Allergies: No Known Allergies  Medications Prior to Admission  Medication Sig Dispense Refill Last Dose   Doxylamine-Pyridoxine 10-10 MG TBEC Take by mouth.   Past Week at Unknown time   Probiotic Product (ALIGN) 4 MG CAPS Take 1 capsule (4 mg total) by mouth daily. 30 capsule 2 Past Week at Unknown time   ALPRAZolam (XANAX) 0.5 MG tablet Take 1 tablet (0.5 mg total) by mouth at bedtime as needed for anxiety. (Patient not  taking: No sig reported) 15 tablet 0    BLACK CURRANT SEED OIL PO Take 1 capsule by mouth daily. (Patient not taking: No sig reported)        Physical Exam   Blood pressure 115/67, pulse 87, temperature 98.6 F (37 C), temperature source Oral, resp. rate 17, height 5\' 1"  (1.549 m), weight 99 kg, SpO2 99 %.  General appearance: alert, cooperative, and no distress Abdomen: soft, non-tender; bowel sounds normal; no masses,  no organomegaly and gravid Pelvic: adnexae not palpable, cervix normal in appearance, external genitalia normal, no bladder tenderness, and brown vaginal discharge. Cervix firm closed. Uterus gravid Extremities: no edema, redness or tenderness in the calves or thighs ED Course  IMP: vaginal bleeding in pregnancy IUP @ 14 1/7 wk Depression affecting pregnancy P) OB sonogram. Social services MDM   3/7, MD 9:50 AM 08/10/2020  Addendum:  10/10/2020 OB Comp Less 14 Wks  Result Date: 08/10/2020 CLINICAL DATA:  Vaginal bleeding.  First trimester pregnancy. EXAM: OBSTETRIC <14 WK ULTRASOUND TECHNIQUE: Transabdominal ultrasound was performed for evaluation of the gestation as well as the maternal uterus and adnexal regions. COMPARISON:  None. FINDINGS: Intrauterine  gestational sac: Single Yolk sac:  Not Visualized. Embryo:  Visualized. Cardiac Activity: Visualized. Heart Rate: 133 bpm CRL:   82 mm   14 w 1 d                  10/10/2020 EDC: 02/07/2021 Subchorionic hemorrhage:  None visualized. Maternal uterus/adnexae: Left ovary is normal in appearance. Right ovary is not directly visualized, however no adnexal mass identified. No evidence of free fluid. IMPRESSION: Single living IUP with estimated gestational age of [redacted] weeks 1 day, and 04/09/2021 EDC of 02/07/2021. No maternal uterine or adnexal abnormality identified. Electronically Signed   By: 04/09/2021 M.D.   On: 08/10/2020 08:47    Pt advised of sonogram result Will start on Zoloft  Social worker speaking to pt currently D/c home F/u office( has appt) Advise pt of need to contact psychiatrist Threatened AB precautions Pelvic rest

## 2020-08-10 NOTE — MAU Note (Signed)
Noted blood when she wiped this morning, not bright red but brown.  Saw something in the toilet, not a clot, looked more like 'dirt'.  Feeling pressure in lower abd, no pain. Denies recent intercourse.  Did not call physician. Has been seen in office with preg. Had [place] on 3/3- "everything was fine".

## 2020-08-10 NOTE — MAU Note (Addendum)
Has been really stressed, not handling things well. Hx of depression, not currently on meds.  Was thinking of going to Behavior Health on Sunday.  Has not thought of harming self or others, no plan to hurt self, but if something happened- it would be ok. Dr Cherly Hensen addressed with pt, pt had called office yesterday regarding depression- was instructed to go to [facility], asked if she had gone- pt again stated was planning to go on Sunday. Pt encouraged to call and reconnect with therapist, (had one years ago). Pt had asked husband to step out during exam.  While alone with pt, asked if there was a problem or something she wished to or needed to  discuss at this time, she said no- they are fine.

## 2020-08-10 NOTE — Discharge Instructions (Signed)
http://APA.org/depression-guideline"> https://clinicalkey.com"> http://point-of-care.elsevierperformancemanager.com/skills/"> http://point-of-care.elsevierperformancemanager.com">  Managing Depression, Adult Depression is a mental health condition that affects your thoughts, feelings, and actions. Being diagnosed with depression can bring you relief if you did not know why you have felt or behaved a certain way. It could also leave you feeling overwhelmed with uncertainty about your future. Preparing yourself to manage your symptoms can help you feel more positive about your future. How to manage lifestyle changes Managing stress Stress is your body's reaction to life changes and events, both good and bad. Stress can add to your feelings of depression. Learning to manage your stress can help lessen your feelings of depression. Try some of the following approaches to reducing your stress (stress reduction techniques):  Listen to music that you enjoy and that inspires you.  Try using a meditation app or take a meditation class.  Develop a practice that helps you connect with your spiritual self. Walk in nature, pray, or go to a place of worship.  Do some deep breathing. To do this, inhale slowly through your nose. Pause at the top of your inhale for a few seconds and then exhale slowly, letting your muscles relax.  Practice yoga to help relax and work your muscles. Choose a stress reduction technique that suits your lifestyle and personality. These techniques take time and practice to develop. Set aside 5-15 minutes a day to do them. Therapists can offer training in these techniques. Other things you can do to manage stress include:  Keeping a stress diary.  Knowing your limits and saying no when you think something is too much.  Paying attention to how you react to certain situations. You may not be able to control everything, but you can change your reaction.  Adding humor to your life  by watching funny films or TV shows.  Making time for activities that you enjoy and that relax you.   Medicines Medicines, such as antidepressants, are often a part of treatment for depression.  Talk with your pharmacist or health care provider about all the medicines, supplements, and herbal products that you take, their possible side effects, and what medicines and other products are safe to take together.  Make sure to report any side effects you may have to your health care provider. Relationships Your health care provider may suggest family therapy, couples therapy, or individual therapy as part of your treatment. How to recognize changes Everyone responds differently to treatment for depression. As you recover from depression, you may start to:  Have more interest in doing activities.  Feel less hopeless.  Have more energy.  Overeat less often, or have a better appetite.  Have better mental focus. It is important to recognize if your depression is not getting better or is getting worse. The symptoms you had in the beginning may return, such as:  Tiredness (fatigue) or low energy.  Eating too much or too little.  Sleeping too much or too little.  Feeling restless, agitated, or hopeless.  Trouble focusing or making decisions.  Unexplained physical complaints.  Feeling irritable, angry, or aggressive. If you or your family members notice these symptoms coming back, let your health care provider know right away. Follow these instructions at home: Activity  Try to get some form of exercise each day, such as walking, biking, swimming, or lifting weights.  Practice stress reduction techniques.  Engage your mind by taking a class or doing some volunteer work.   Lifestyle  Get the right amount and quality of  sleep.  Cut down on using caffeine, tobacco, alcohol, and other potentially harmful substances.  Eat a healthy diet that includes plenty of vegetables, fruits,  whole grains, low-fat dairy products, and lean protein. Do not eat a lot of foods that are high in solid fats, added sugars, or salt (sodium). General instructions  Take over-the-counter and prescription medicines only as told by your health care provider.  Keep all follow-up visits as told by your health care provider. This is important. Where to find support Talking to others Friends and family members can be sources of support and guidance. Talk to trusted friends or family members about your condition. Explain your symptoms to them, and let them know that you are working with a health care provider to treat your depression. Tell friends and family members how they also can be helpful.   Finances  Find appropriate mental health providers that fit with your financial situation.  Talk with your health care provider about options to get reduced prices on your medicines. Where to find more information You can find support in your area from:  Anxiety and Depression Association of America (ADAA): www.adaa.org  Mental Health America: www.mentalhealthamerica.net  [employer] on Mental Illness: www.nami.org Contact a health care provider if:  You stop taking your antidepressant medicines, and you have any of these symptoms: ? Nausea. ? Headache. ? Light-headedness. ? Chills and body aches. ? Not being able to sleep (insomnia).  You or your friends and family think your depression is getting worse. Get help right away if:  You have thoughts of hurting yourself or others. If you ever feel like you may hurt yourself or others, or have thoughts about taking your own life, get help right away. Go to your nearest emergency department or:  Call your local emergency services (911 in the [place]  Call a suicide crisis helpline, such as the National Suicide Prevention Lifeline at (432)146-1251. This is open 24 hours a day in the [place]  Text the Crisis Text Line at 873-203-0540 (in the  [place] Summary  If you are diagnosed with depression, preparing yourself to manage your symptoms is a good way to feel positive about your future.  Work with your health care provider on a management plan that includes stress reduction techniques, medicines (if applicable), therapy, and healthy lifestyle habits.  Keep talking with your health care provider about how your treatment is working.  If you have thoughts about taking your own life, call a suicide crisis helpline or text a crisis text line. This information is not intended to replace advice given to you by your health care provider. Make sure you discuss any questions you have with your health care provider. Document Revised: 03/30/2019 Document Reviewed: 03/30/2019 Elsevier Patient Education  2021 Elsevier Inc.  Vaginal Bleeding During Pregnancy, First Trimester A small amount of bleeding from the vagina, or spotting, is common during early pregnancy. Some bleeding may be related to the pregnancy, and some may not. In many cases, the bleeding is normal and is not a problem. However, bleeding can also be a sign of something serious. Normal things that may cause bleeding during the first trimester: Implantation of the fertilized egg in the lining of the uterus. Rapid changes in blood vessels. This is caused by changes that are happening to the body during pregnancy. Sex. Pelvic exams. Abnormal things that may cause bleeding during the first trimester include: Infection or inflammation of the cervix. Growths or polyps on the cervix. Miscarriage or threatened  miscarriage. Pregnancy that is growing outside of the uterus (ectopic pregnancy). A fertilized egg that becomes a mass of tissue (molar pregnancy). Tell your health care provider right away if there is any bleeding from your vagina. Follow these instructions at home: Monitoring your bleeding Monitor your bleeding. Pay attention to any changes in your symptoms. Let your  health care provider know about any concerns. Try to understand when the bleeding occurs. Does the bleeding start on its own, or does it start after something is done, such as sex or a pelvic exam? Use a diary to record the things you see about your bleeding, including: The kind of bleeding you are having. Does the bleeding start and stop irregularly, or is it a constant flow? The severity of your bleeding. Is the bleeding heavy or light? The number of pads you use each day, how often you change them, and how soaked they are. Tell your health care provider if you pass tissue. He or she may want to see it.   Activity Follow instructions from your health care provider about limiting your activity. Ask what activities are safe for you. Do not have sex until your health care provider says that this is safe. If needed, make plans for someone to help with your regular activities. General instructions Take over-the-counter and prescription medicines only as told by your health care provider. Do not take aspirin because it can cause bleeding. Do not use tampons or douche. Keep all follow-up visits. This is important. Contact a health care provider if: You have vaginal bleeding during any part of your pregnancy. You have cramps or labor pains. You have a fever or chills. Get help right away if: You have severe cramps in your back or abdomen. You pass large clots or a large amount of tissue from your vagina. Your bleeding increases. You feel light-headed or weak, or you faint. You are leaking fluid or have a gush of fluid from your vagina. Summary A small amount of bleeding from the vagina is common during early pregnancy. Be sure to tell your health care provider about any vaginal bleeding right away. Try to understand when bleeding occurs. Does bleeding occur on its own, or does it occur after something is done, such as sex or pelvic exams? Keep all follow-up visits. This is important. This  information is not intended to replace advice given to you by your health care provider. Make sure you discuss any questions you have with your health care provider. Document Revised: 02/09/2020 Document Reviewed: 02/09/2020 Elsevier Patient Education  2021 Elsevier Inc.   Make appt with psychiatrist Keep scheduled OB appt. Use behavioral health( [facility]) for immediate mental care Pelvic rest while bleeding

## 2020-08-10 NOTE — Social Work (Signed)
CSW consulted due to MOB hx of depression, reports currently very stressed, not handling things well. Denies plan or wanting to hurt self or others, but stated "if something happened it would be ok".   CSW met with MOB to offer support and provide resources. CSW introduced self and role. CSW observed FOB Kris Mouton also present in the room. MOB provided CSW permission to speak in private. FOB politely exited the room. CSW informed MOB of reason for consult. MOB is currently 14 weeks and reported this is an unplanned pregnancy. MOB stated she had recently lost 40lbs and then found out about the pregnancy. MOB lives with FOB and two children at home. MOB reported she has a history of anxiety and depression and she has had more depressive symptoms lately as a result of arguing with FOB all the time. MOB became tearful as she disclosed FOB has not been understanding of her feelings. CSW asked MOB if they have ever tried couples counseling. MOB stated they have never tried counseling, but she believes it would be helpful. MOB reported she has previously been to counseling with [facility] and would feel comfortable reaching out. CSW suggested MOB also consider individual counseling for her own self care. CSW provided MOB with additional local counseling resources. CSW also provided information on the [facility] which provides 24/7 assistance. MOB shared she does not do much other than stay at home, out of fear of something happening. CSW expressed understanding and provided additional coping suggestions such as walking around the neighborhood, to get some time out of the home. MOB expressed agreement and shared she used to walk 3 miles a day prior to the pregnancy as part of her self-care. MOB was prescribed Zoloft to pickup upon hospital discharge. MOB stated she has previously been on Zoloft, Xanax and Buspirone in the past. MOB expressed that Zoloft is hard to start and  stop. CSW suggested MOB inform her OBGYN if she has challenges with the medication. CSW asked MOB about her comment that she would be okay if something happened to her. MOB stated "I feel like that every day" but denies any SI or HI. MOB reported she feels safe at home and has no plans of hurting herself.  CSW asked MOB if she has a support system. MOB reported that although she has family and friends, she does not have supports during this time. MOB stated she will reach out to the counseling agency where she previously received services. MOB declined a referral to [facility] at this time. MOB denies any additional needs at this time.    CSW identifies no further need for intervention and no barriers to discharge at this time.  Darra Lis, [place] [employer] [phone]

## 2020-08-30 DIAGNOSIS — O09522 Supervision of elderly multigravida, second trimester: Secondary | ICD-10-CM | POA: Diagnosis not present

## 2020-08-30 DIAGNOSIS — Z361 Encounter for antenatal screening for raised alphafetoprotein level: Secondary | ICD-10-CM | POA: Diagnosis not present

## 2020-09-03 ENCOUNTER — Telehealth: Payer: Self-pay

## 2020-09-03 ENCOUNTER — Other Ambulatory Visit: Payer: Self-pay | Admitting: Obstetrics and Gynecology

## 2020-09-03 DIAGNOSIS — Z3A17 17 weeks gestation of pregnancy: Secondary | ICD-10-CM

## 2020-09-03 DIAGNOSIS — O09522 Supervision of elderly multigravida, second trimester: Secondary | ICD-10-CM

## 2020-09-03 NOTE — Telephone Encounter (Signed)
Recieved referral from Dr. Purnell Shoemaker for pt to be scheduled for a detailed ultrasound in 2-3 weeks. Spoke with patient and scheduled her for 09/27/20 at 12:45pm.

## 2020-09-06 DIAGNOSIS — F4323 Adjustment disorder with mixed anxiety and depressed mood: Secondary | ICD-10-CM | POA: Diagnosis not present

## 2020-09-18 DIAGNOSIS — F4323 Adjustment disorder with mixed anxiety and depressed mood: Secondary | ICD-10-CM | POA: Diagnosis not present

## 2020-09-27 ENCOUNTER — Ambulatory Visit: Payer: BC Managed Care – PPO | Attending: Obstetrics and Gynecology

## 2020-09-27 ENCOUNTER — Other Ambulatory Visit: Payer: Self-pay

## 2020-09-27 ENCOUNTER — Ambulatory Visit: Payer: BC Managed Care – PPO | Admitting: *Deleted

## 2020-09-27 VITALS — BP 106/60 | HR 83

## 2020-09-27 DIAGNOSIS — O321XX0 Maternal care for breech presentation, not applicable or unspecified: Secondary | ICD-10-CM

## 2020-09-27 DIAGNOSIS — Z363 Encounter for antenatal screening for malformations: Secondary | ICD-10-CM

## 2020-09-27 DIAGNOSIS — Z3A17 17 weeks gestation of pregnancy: Secondary | ICD-10-CM | POA: Insufficient documentation

## 2020-09-27 DIAGNOSIS — O99212 Obesity complicating pregnancy, second trimester: Secondary | ICD-10-CM | POA: Diagnosis not present

## 2020-09-27 DIAGNOSIS — O09522 Supervision of elderly multigravida, second trimester: Secondary | ICD-10-CM | POA: Insufficient documentation

## 2020-09-27 DIAGNOSIS — Z3A20 20 weeks gestation of pregnancy: Secondary | ICD-10-CM

## 2020-09-28 ENCOUNTER — Other Ambulatory Visit: Payer: Self-pay | Admitting: *Deleted

## 2020-09-28 DIAGNOSIS — Z6841 Body Mass Index (BMI) 40.0 and over, adult: Secondary | ICD-10-CM

## 2020-10-02 DIAGNOSIS — F4323 Adjustment disorder with mixed anxiety and depressed mood: Secondary | ICD-10-CM | POA: Diagnosis not present

## 2020-10-19 DIAGNOSIS — F4323 Adjustment disorder with mixed anxiety and depressed mood: Secondary | ICD-10-CM | POA: Diagnosis not present

## 2020-10-22 DIAGNOSIS — O2340 Unspecified infection of urinary tract in pregnancy, unspecified trimester: Secondary | ICD-10-CM | POA: Diagnosis not present

## 2020-10-22 DIAGNOSIS — O99342 Other mental disorders complicating pregnancy, second trimester: Secondary | ICD-10-CM | POA: Diagnosis not present

## 2020-10-22 DIAGNOSIS — O09522 Supervision of elderly multigravida, second trimester: Secondary | ICD-10-CM | POA: Diagnosis not present

## 2020-10-25 ENCOUNTER — Ambulatory Visit: Payer: BC Managed Care – PPO

## 2020-10-25 DIAGNOSIS — O99342 Other mental disorders complicating pregnancy, second trimester: Secondary | ICD-10-CM | POA: Diagnosis not present

## 2020-10-25 DIAGNOSIS — O4452 Low lying placenta with hemorrhage, second trimester: Secondary | ICD-10-CM | POA: Diagnosis not present

## 2020-10-25 DIAGNOSIS — O09522 Supervision of elderly multigravida, second trimester: Secondary | ICD-10-CM | POA: Diagnosis not present

## 2020-11-01 DIAGNOSIS — F4323 Adjustment disorder with mixed anxiety and depressed mood: Secondary | ICD-10-CM | POA: Diagnosis not present

## 2020-11-22 DIAGNOSIS — O4443 Low lying placenta NOS or without hemorrhage, third trimester: Secondary | ICD-10-CM | POA: Diagnosis not present

## 2020-11-22 DIAGNOSIS — Z3689 Encounter for other specified antenatal screening: Secondary | ICD-10-CM | POA: Diagnosis not present

## 2020-11-22 DIAGNOSIS — O99343 Other mental disorders complicating pregnancy, third trimester: Secondary | ICD-10-CM | POA: Diagnosis not present

## 2020-11-22 DIAGNOSIS — O09523 Supervision of elderly multigravida, third trimester: Secondary | ICD-10-CM | POA: Diagnosis not present

## 2020-11-22 DIAGNOSIS — F4323 Adjustment disorder with mixed anxiety and depressed mood: Secondary | ICD-10-CM | POA: Diagnosis not present

## 2020-11-22 LAB — OB RESULTS CONSOLE RPR: RPR: NONREACTIVE

## 2020-11-30 DIAGNOSIS — M5441 Lumbago with sciatica, right side: Secondary | ICD-10-CM | POA: Diagnosis not present

## 2020-11-30 DIAGNOSIS — M9905 Segmental and somatic dysfunction of pelvic region: Secondary | ICD-10-CM | POA: Diagnosis not present

## 2020-11-30 DIAGNOSIS — M9904 Segmental and somatic dysfunction of sacral region: Secondary | ICD-10-CM | POA: Diagnosis not present

## 2020-11-30 DIAGNOSIS — M9903 Segmental and somatic dysfunction of lumbar region: Secondary | ICD-10-CM | POA: Diagnosis not present

## 2020-12-04 DIAGNOSIS — O358XX0 Maternal care for other (suspected) fetal abnormality and damage, not applicable or unspecified: Secondary | ICD-10-CM | POA: Diagnosis not present

## 2020-12-06 DIAGNOSIS — F4323 Adjustment disorder with mixed anxiety and depressed mood: Secondary | ICD-10-CM | POA: Diagnosis not present

## 2020-12-10 DIAGNOSIS — N83292 Other ovarian cyst, left side: Secondary | ICD-10-CM | POA: Diagnosis not present

## 2020-12-10 DIAGNOSIS — O09523 Supervision of elderly multigravida, third trimester: Secondary | ICD-10-CM | POA: Diagnosis not present

## 2020-12-10 DIAGNOSIS — O99343 Other mental disorders complicating pregnancy, third trimester: Secondary | ICD-10-CM | POA: Diagnosis not present

## 2020-12-13 DIAGNOSIS — O359XX0 Maternal care for (suspected) fetal abnormality and damage, unspecified, not applicable or unspecified: Secondary | ICD-10-CM | POA: Diagnosis not present

## 2020-12-20 DIAGNOSIS — F4323 Adjustment disorder with mixed anxiety and depressed mood: Secondary | ICD-10-CM | POA: Diagnosis not present

## 2020-12-25 DIAGNOSIS — O2340 Unspecified infection of urinary tract in pregnancy, unspecified trimester: Secondary | ICD-10-CM | POA: Diagnosis not present

## 2020-12-25 DIAGNOSIS — Z23 Encounter for immunization: Secondary | ICD-10-CM | POA: Diagnosis not present

## 2020-12-25 DIAGNOSIS — O99343 Other mental disorders complicating pregnancy, third trimester: Secondary | ICD-10-CM | POA: Diagnosis not present

## 2020-12-25 DIAGNOSIS — O09299 Supervision of pregnancy with other poor reproductive or obstetric history, unspecified trimester: Secondary | ICD-10-CM | POA: Diagnosis not present

## 2020-12-25 DIAGNOSIS — O09523 Supervision of elderly multigravida, third trimester: Secondary | ICD-10-CM | POA: Diagnosis not present

## 2021-01-04 DIAGNOSIS — F4323 Adjustment disorder with mixed anxiety and depressed mood: Secondary | ICD-10-CM | POA: Diagnosis not present

## 2021-01-05 ENCOUNTER — Other Ambulatory Visit: Payer: Self-pay

## 2021-01-05 ENCOUNTER — Inpatient Hospital Stay (HOSPITAL_COMMUNITY)
Admission: AD | Admit: 2021-01-05 | Discharge: 2021-01-05 | Disposition: A | Discharge status: Home / Self Care | Payer: BC Managed Care – PPO | Source: Ambulatory Visit | Attending: Obstetrics and Gynecology | Admitting: Obstetrics and Gynecology

## 2021-01-05 ENCOUNTER — Encounter (HOSPITAL_COMMUNITY): Payer: Self-pay | Admitting: Obstetrics and Gynecology

## 2021-01-05 DIAGNOSIS — F32A Depression, unspecified: Secondary | ICD-10-CM | POA: Diagnosis not present

## 2021-01-05 DIAGNOSIS — Z0371 Encounter for suspected problem with amniotic cavity and membrane ruled out: Secondary | ICD-10-CM | POA: Diagnosis not present

## 2021-01-05 DIAGNOSIS — O09523 Supervision of elderly multigravida, third trimester: Secondary | ICD-10-CM | POA: Insufficient documentation

## 2021-01-05 DIAGNOSIS — Z3A34 34 weeks gestation of pregnancy: Secondary | ICD-10-CM | POA: Insufficient documentation

## 2021-01-05 DIAGNOSIS — Z87891 Personal history of nicotine dependence: Secondary | ICD-10-CM | POA: Insufficient documentation

## 2021-01-05 DIAGNOSIS — N898 Other specified noninflammatory disorders of vagina: Secondary | ICD-10-CM

## 2021-01-05 DIAGNOSIS — O99343 Other mental disorders complicating pregnancy, third trimester: Secondary | ICD-10-CM | POA: Insufficient documentation

## 2021-01-05 DIAGNOSIS — O4290 Premature rupture of membranes, unspecified as to length of time between rupture and onset of labor, unspecified weeks of gestation: Secondary | ICD-10-CM

## 2021-01-05 DIAGNOSIS — O26893 Other specified pregnancy related conditions, third trimester: Secondary | ICD-10-CM

## 2021-01-05 DIAGNOSIS — O99891 Other specified diseases and conditions complicating pregnancy: Secondary | ICD-10-CM

## 2021-01-05 DIAGNOSIS — Z79899 Other long term (current) drug therapy: Secondary | ICD-10-CM | POA: Diagnosis not present

## 2021-01-05 LAB — POCT FERN TEST: POCT Fern Test: NEGATIVE

## 2021-01-05 LAB — WET PREP, GENITAL
Clue Cells Wet Prep HPF POC: NONE SEEN
Sperm: NONE SEEN
Trich, Wet Prep: NONE SEEN
Yeast Wet Prep HPF POC: NONE SEEN

## 2021-01-05 LAB — AMNISURE RUPTURE OF MEMBRANE (ROM) NOT AT ARMC: Amnisure ROM: NEGATIVE

## 2021-01-05 NOTE — MAU Note (Signed)
Friday 0330 am, saw dampness in underwear, changed underwear frequently since then. No bleeding. Slight laugh and feel trickle but didn't see actual fluid to see color.  Baby moving well.  Denies pain.  Last intercourse was a week ago.

## 2021-01-05 NOTE — MAU Provider Note (Signed)
History     Chief Complaint  Patient presents with   Rupture of Membranes  34 yo [id number] BF @ 34 6/[redacted] week gestation presents with c/o leaking fluid since Friday. (+) FM denies ctx   OB History     Gravida  5   Para  2   Term  2   Preterm      AB  2   Living  2      SAB      IAB  2   Ectopic      Multiple      Live Births  2           Past Medical History:  Diagnosis Date   Anemia    @ last [facility] appt   Depression     no meds currently, was thinking of going to behavior health   H/O hiatal hernia    Hx of ulcer disease    Hx of varicella    Infection    UTI   NSVD (normal spontaneous vaginal delivery) 08/26/2012    Past Surgical History:  Procedure Laterality Date   CHOLECYSTECTOMY N/A 06/11/2017   Procedure: LAPAROSCOPIC CHOLECYSTECTOMY WITH INTRAOPERATIVE CHOLANGIOGRAM;  Surgeon: Manus Rudd, MD;  Location: [facility] OR;  Service: General;  Laterality: N/A;   FOOT SURGERY Left    broken toe, ligament removed from back of leg   TONSILLECTOMY     As a baby    Family History  Problem Relation Age of Onset   Epilepsy Sister    Depression Sister    Bipolar disorder Sister    Hypertension Mother    Tuberculosis Mother        as a child   Diabetes Mother        diet controlled   Depression Mother    Cancer Father        prostate   Colon cancer Paternal Grandfather    Cancer Paternal Grandfather        prostate   Breast cancer Maternal Aunt        x 3;after menopause   Diabetes Maternal Grandmother    Hypertension Maternal Grandmother    Stomach cancer Paternal Grandmother    Heart disease Neg Hx     Social History   Tobacco Use   Smoking status: Former    Packs/day: 0.50    Years: 4.00    Pack years: 2.00    Types: Cigarettes    Quit date: 04/21/2016    Years since quitting: 4.7   Smokeless tobacco: Never  Vaping Use   Vaping Use: Never used  Substance Use Topics   Alcohol use: Not Currently    Alcohol/week: 2.0 standard drinks     Types: 1 Glasses of wine, 1 Shots of liquor per week    Comment: occasionally   Drug use: No    Allergies: No Known Allergies  Medications Prior to Admission  Medication Sig Dispense Refill Last Dose   sertraline (ZOLOFT) 50 MG tablet Take 50 mg by mouth daily.   01/05/2021   Prenatal MV & Min w/FA-DHA (PRENATAL GUMMIES PO) Take by mouth.      Probiotic Product (ALIGN) 4 MG CAPS Take 1 capsule (4 mg total) by mouth daily. 30 capsule 2      Physical Exam   Blood pressure 115/68, pulse 97, temperature 98.7 F (37.1 C), temperature source Oral, resp. rate 16, height 5\' 1"  (1.549 m), weight 114.8 kg, last menstrual period 05/06/2020, SpO2 97 %.  General appearance: alert, cooperative, and no distress Lungs: clear to auscultation bilaterally Breasts:  fibrocystic Heart: regular rate and rhythm, S1, S2 normal, no murmur, click, rub or gallop Abdomen:  gravid soft nontender FH 35 cm Pelvic: cervix normal in appearance, external genitalia normal, and creamy white discharge w/o odor. Wet prep done/ cervix long/closed/-4 ctx Extremities: extremities normal, atraumatic, no cyanosis or edema and no edema, redness or tenderness in the calves or thighs  Fern neg. Amnio sure pending. Wet prep done Tracing: baseline 130 (+) accel 150 Ui ED Course  IMP: leukorrhea of pregnancy  Vaginal discharge IUP @ 34 6/7 week Depression on med P) await amniosure. Wet prep. D/c home if neg w/u. Keep scheduled appts MDM   Serita Kyle, MD 8:18 PM 01/05/2021  Addendum: amniosure neg Wet prep wbc many otherwise neg D/c home

## 2021-01-09 DIAGNOSIS — O99343 Other mental disorders complicating pregnancy, third trimester: Secondary | ICD-10-CM | POA: Diagnosis not present

## 2021-01-09 DIAGNOSIS — O09523 Supervision of elderly multigravida, third trimester: Secondary | ICD-10-CM | POA: Diagnosis not present

## 2021-01-09 DIAGNOSIS — N83292 Other ovarian cyst, left side: Secondary | ICD-10-CM | POA: Diagnosis not present

## 2021-01-09 DIAGNOSIS — Z3685 Encounter for antenatal screening for Streptococcus B: Secondary | ICD-10-CM | POA: Diagnosis not present

## 2021-01-09 DIAGNOSIS — O09299 Supervision of pregnancy with other poor reproductive or obstetric history, unspecified trimester: Secondary | ICD-10-CM | POA: Diagnosis not present

## 2021-01-09 LAB — OB RESULTS CONSOLE GBS: GBS: POSITIVE

## 2021-01-17 DIAGNOSIS — F4323 Adjustment disorder with mixed anxiety and depressed mood: Secondary | ICD-10-CM | POA: Diagnosis not present

## 2021-01-17 DIAGNOSIS — O99343 Other mental disorders complicating pregnancy, third trimester: Secondary | ICD-10-CM | POA: Diagnosis not present

## 2021-01-17 DIAGNOSIS — O09523 Supervision of elderly multigravida, third trimester: Secondary | ICD-10-CM | POA: Diagnosis not present

## 2021-01-17 DIAGNOSIS — O09299 Supervision of pregnancy with other poor reproductive or obstetric history, unspecified trimester: Secondary | ICD-10-CM | POA: Diagnosis not present

## 2021-01-17 DIAGNOSIS — O99820 Streptococcus B carrier state complicating pregnancy: Secondary | ICD-10-CM | POA: Diagnosis not present

## 2021-01-22 ENCOUNTER — Encounter (HOSPITAL_COMMUNITY): Payer: Self-pay | Admitting: Obstetrics and Gynecology

## 2021-01-22 ENCOUNTER — Other Ambulatory Visit: Payer: Self-pay

## 2021-01-22 ENCOUNTER — Inpatient Hospital Stay (HOSPITAL_COMMUNITY)
Admission: AD | Admit: 2021-01-22 | Discharge: 2021-01-24 | DRG: 807 | Disposition: A | Discharge status: Home / Self Care | Payer: BC Managed Care – PPO | Attending: Obstetrics and Gynecology | Admitting: Obstetrics and Gynecology

## 2021-01-22 DIAGNOSIS — O26893 Other specified pregnancy related conditions, third trimester: Secondary | ICD-10-CM | POA: Diagnosis not present

## 2021-01-22 DIAGNOSIS — O09293 Supervision of pregnancy with other poor reproductive or obstetric history, third trimester: Secondary | ICD-10-CM

## 2021-01-22 DIAGNOSIS — D649 Anemia, unspecified: Secondary | ICD-10-CM | POA: Diagnosis not present

## 2021-01-22 DIAGNOSIS — Z20822 Contact with and (suspected) exposure to covid-19: Secondary | ICD-10-CM | POA: Diagnosis present

## 2021-01-22 DIAGNOSIS — Z3A37 37 weeks gestation of pregnancy: Secondary | ICD-10-CM | POA: Diagnosis not present

## 2021-01-22 DIAGNOSIS — O9081 Anemia of the puerperium: Secondary | ICD-10-CM | POA: Diagnosis not present

## 2021-01-22 DIAGNOSIS — Z87891 Personal history of nicotine dependence: Secondary | ICD-10-CM

## 2021-01-22 DIAGNOSIS — O9902 Anemia complicating childbirth: Secondary | ICD-10-CM | POA: Diagnosis not present

## 2021-01-22 DIAGNOSIS — O99824 Streptococcus B carrier state complicating childbirth: Secondary | ICD-10-CM | POA: Diagnosis present

## 2021-01-22 DIAGNOSIS — Z23 Encounter for immunization: Secondary | ICD-10-CM | POA: Diagnosis not present

## 2021-01-22 DIAGNOSIS — F32A Depression, unspecified: Secondary | ICD-10-CM | POA: Diagnosis present

## 2021-01-22 DIAGNOSIS — O4403 Placenta previa specified as without hemorrhage, third trimester: Secondary | ICD-10-CM | POA: Diagnosis not present

## 2021-01-22 DIAGNOSIS — O99344 Other mental disorders complicating childbirth: Principal | ICD-10-CM | POA: Diagnosis present

## 2021-01-22 DIAGNOSIS — O4290 Premature rupture of membranes, unspecified as to length of time between rupture and onset of labor, unspecified weeks of gestation: Secondary | ICD-10-CM

## 2021-01-22 LAB — CBC
HCT: 34.8 % — ABNORMAL LOW (ref 36.0–46.0)
Hemoglobin: 11.2 g/dL — ABNORMAL LOW (ref 12.0–15.0)
MCH: 29.9 pg (ref 26.0–34.0)
MCHC: 32.2 g/dL (ref 30.0–36.0)
MCV: 93 fL (ref 80.0–100.0)
Platelets: 266 10*3/uL (ref 150–400)
RBC: 3.74 MIL/uL — ABNORMAL LOW (ref 3.87–5.11)
RDW: 13.2 % (ref 11.5–15.5)
WBC: 10.2 10*3/uL (ref 4.0–10.5)
nRBC: 0 % (ref 0.0–0.2)

## 2021-01-22 LAB — RESP PANEL BY RT-PCR (FLU A&B, COVID) ARPGX2
Influenza A by PCR: NEGATIVE
Influenza B by PCR: NEGATIVE
SARS Coronavirus 2 by RT PCR: NEGATIVE

## 2021-01-22 MED ORDER — FENTANYL CITRATE (PF) 100 MCG/2ML IJ SOLN
100.0000 ug | INTRAMUSCULAR | Status: DC | PRN
  Administered 2021-01-23: 100 ug via INTRAVENOUS
  Filled 2021-01-22: qty 2

## 2021-01-22 MED ORDER — OXYTOCIN-SODIUM CHLORIDE 30-0.9 UT/500ML-% IV SOLN
2.5000 [IU]/h | INTRAVENOUS | Status: DC
  Filled 2021-01-22: qty 500

## 2021-01-22 MED ORDER — OXYTOCIN-SODIUM CHLORIDE 30-0.9 UT/500ML-% IV SOLN
1.0000 m[IU]/min | INTRAVENOUS | Status: DC
  Administered 2021-01-23: 2 m[IU]/min via INTRAVENOUS

## 2021-01-22 MED ORDER — OXYCODONE-ACETAMINOPHEN 5-325 MG PO TABS: 1.0000 | ORAL_TABLET | ORAL | Status: DC | PRN

## 2021-01-22 MED ORDER — LACTATED RINGERS IV SOLN: INTRAVENOUS | Status: DC

## 2021-01-22 MED ORDER — LACTATED RINGERS IV SOLN: 500.0000 mL | INTRAVENOUS | Status: DC | PRN

## 2021-01-22 MED ORDER — TERBUTALINE SULFATE 1 MG/ML IJ SOLN: 0.2500 mg | Freq: Once | INTRAMUSCULAR | Status: DC | PRN

## 2021-01-22 MED ORDER — ACETAMINOPHEN 325 MG PO TABS: 650.0000 mg | ORAL_TABLET | ORAL | Status: DC | PRN

## 2021-01-22 MED ORDER — ONDANSETRON HCL 4 MG/2ML IJ SOLN
4.0000 mg | Freq: Four times a day (QID) | INTRAMUSCULAR | Status: DC | PRN
  Administered 2021-01-23: 4 mg via INTRAVENOUS
  Filled 2021-01-22: qty 2

## 2021-01-22 MED ORDER — OXYCODONE-ACETAMINOPHEN 5-325 MG PO TABS: 2.0000 | ORAL_TABLET | ORAL | Status: DC | PRN

## 2021-01-22 MED ORDER — SODIUM CHLORIDE 0.9 % IV SOLN
5.0000 10*6.[IU] | Freq: Once | INTRAVENOUS | Status: AC
  Administered 2021-01-23: 5 10*6.[IU] via INTRAVENOUS
  Filled 2021-01-22: qty 5

## 2021-01-22 MED ORDER — OXYTOCIN 10 UNIT/ML IJ SOLN: 10.0000 [IU] | Freq: Once | INTRAMUSCULAR | Status: DC

## 2021-01-22 MED ORDER — OXYTOCIN BOLUS FROM INFUSION
333.0000 mL | Freq: Once | INTRAVENOUS | Status: AC
  Administered 2021-01-23: 333 mL via INTRAVENOUS

## 2021-01-22 MED ORDER — SOD CITRATE-CITRIC ACID 500-334 MG/5ML PO SOLN: 30.0000 mL | ORAL | Status: DC | PRN

## 2021-01-22 MED ORDER — LIDOCAINE HCL (PF) 1 % IJ SOLN
30.0000 mL | INTRAMUSCULAR | Status: AC | PRN
  Administered 2021-01-23: 30 mL via SUBCUTANEOUS

## 2021-01-22 MED ORDER — PENICILLIN G POT IN DEXTROSE 60000 UNIT/ML IV SOLN
3.0000 10*6.[IU] | INTRAVENOUS | Status: DC
  Administered 2021-01-23: 3 10*6.[IU] via INTRAVENOUS
  Filled 2021-01-22: qty 50

## 2021-01-22 NOTE — H&P (Signed)
Mary Gibbs is a 35 y.o. female presenting@ 37 2/[redacted] week gestation with SROM clear fluid. No ctx. (+) FM.  GBS cx positive. Hx PPH. Desires sterilization. Depression on Zoloft. OB History     Gravida  5   Para  2   Term  2   Preterm      AB  2   Living  2      SAB      IAB  2   Ectopic      Multiple      Live Births  2          Past Medical History:  Diagnosis Date   Anemia    @ last [facility] appt   Depression     no meds currently, was thinking of going to behavior health   H/O hiatal hernia    Hx of ulcer disease    Hx of varicella    Infection    UTI   NSVD (normal spontaneous vaginal delivery) 08/26/2012   Past Surgical History:  Procedure Laterality Date   CHOLECYSTECTOMY N/A 06/11/2017   Procedure: LAPAROSCOPIC CHOLECYSTECTOMY WITH INTRAOPERATIVE CHOLANGIOGRAM;  Surgeon: Manus Rudd, MD;  Location: [facility] OR;  Service: General;  Laterality: N/A;   FOOT SURGERY Left    broken toe, ligament removed from back of leg   TONSILLECTOMY     As a baby   Family History: family history includes Bipolar disorder in her sister; Breast cancer in her maternal aunt; Cancer in her father and paternal grandfather; Colon cancer in her paternal grandfather; Depression in her mother and sister; Diabetes in her maternal grandmother and mother; Epilepsy in her sister; Hypertension in her maternal grandmother and mother; Stomach cancer in her paternal grandmother; Tuberculosis in her mother. Social History:  reports that she quit smoking about 4 years ago. Her smoking use included cigarettes. She has a 2.00 pack-year smoking history. She has never used smokeless tobacco. She reports that she does not currently use alcohol after a past usage of about 2.0 standard drinks per week. She reports that she does not use drugs.     Maternal Diabetes: No Genetic Screening: Normal Maternal Ultrasounds/Referrals: Normal Fetal Ultrasounds or other Referrals:  Fetal echo nl Maternal Substance  Abuse:  No Significant Maternal Medications:  Meds include: Zoloft Significant Maternal Lab Results:  Group B Strep positive Other Comments:   depression , hx PPH, 1st trim UTI  Review of Systems  All other systems reviewed and are negative. History   Blood pressure 124/77, pulse 95, temperature 98.3 F (36.8 C), temperature source Oral, resp. rate 16, height 5\' 1"  (1.549 m), weight 113.9 kg, last menstrual period 05/06/2020, SpO2 99 %. Maternal Exam:  Introitus: Normal vulva.  Physical Exam Constitutional:      Appearance: She is obese.  Cardiovascular:     Rate and Rhythm: Regular rhythm.     Heart sounds: Normal heart sounds.  Pulmonary:     Breath sounds: Normal breath sounds.  Abdominal:     Palpations: Abdomen is soft.  Genitourinary:    General: Normal vulva.     Comments: VE: SSE gross clear fluid in vagina  Cervix 1/70/-3 posterior Musculoskeletal:        General: No swelling.     Cervical back: Neck supple.  Skin:    General: Skin is warm.  Neurological:     Mental Status: She is alert and oriented to person, place, and time.  Psychiatric:  Mood and Affect: Mood normal.        Behavior: Behavior normal.    Prenatal labs: ABO, Rh:  A positive Antibody:  negative Rubella:  Immune RPR: Non Reactive (11/08 1426)  HBsAg: Negative (11/08 1426)  HIV: Non Reactive (11/08 1426)  GBS:   positive Hep C neg  Assessment/Plan: SROM IUP @ 37 2/7 weeks  Depression GBS cx (+) Hx PPH  Desires sterilization P) admit  routine labs IV PCN. Pitocin induction. Analgesic/epidural , PPTL. Cont Zoloft  Mary Gibbs A Mary Gibbs 01/22/2021, 7:20 PM

## 2021-01-22 NOTE — MAU Note (Signed)
Started gushing around 1800, clear fluid- still coming. No bleeding. No pains. Reports +FM

## 2021-01-23 ENCOUNTER — Inpatient Hospital Stay (HOSPITAL_COMMUNITY): Payer: BC Managed Care – PPO | Admitting: Anesthesiology

## 2021-01-23 LAB — RPR: RPR Ser Ql: NONREACTIVE

## 2021-01-23 LAB — TYPE AND SCREEN
ABO/RH(D): A POS
Antibody Screen: NEGATIVE

## 2021-01-23 MED ORDER — WITCH HAZEL-GLYCERIN EX PADS: 1.0000 "application " | MEDICATED_PAD | CUTANEOUS | Status: DC | PRN

## 2021-01-23 MED ORDER — FERROUS SULFATE 325 (65 FE) MG PO TABS
325.0000 mg | ORAL_TABLET | Freq: Two times a day (BID) | ORAL | Status: DC
  Administered 2021-01-23 – 2021-01-24 (×3): 325 mg via ORAL
  Filled 2021-01-23 (×3): qty 1

## 2021-01-23 MED ORDER — LIDOCAINE HCL (PF) 1 % IJ SOLN
INTRAMUSCULAR | Status: AC
  Filled 2021-01-23: qty 30

## 2021-01-23 MED ORDER — OXYCODONE HCL 5 MG PO TABS: 10.0000 mg | ORAL_TABLET | ORAL | Status: DC | PRN

## 2021-01-23 MED ORDER — ONDANSETRON HCL 4 MG/2ML IJ SOLN: 4.0000 mg | INTRAMUSCULAR | Status: DC | PRN

## 2021-01-23 MED ORDER — SIMETHICONE 80 MG PO CHEW: 80.0000 mg | CHEWABLE_TABLET | ORAL | Status: DC | PRN

## 2021-01-23 MED ORDER — DIPHENHYDRAMINE HCL 50 MG/ML IJ SOLN
12.5000 mg | INTRAMUSCULAR | Status: DC | PRN
Start: 2021-01-23 — End: 2021-01-23

## 2021-01-23 MED ORDER — SERTRALINE HCL 50 MG PO TABS
50.0000 mg | ORAL_TABLET | Freq: Every day | ORAL | Status: DC
  Administered 2021-01-23: 50 mg via ORAL
  Filled 2021-01-23: qty 1

## 2021-01-23 MED ORDER — BENZOCAINE-MENTHOL 20-0.5 % EX AERO
1.0000 "application " | INHALATION_SPRAY | CUTANEOUS | Status: DC | PRN
  Administered 2021-01-23: 1 via TOPICAL
  Filled 2021-01-23: qty 56

## 2021-01-23 MED ORDER — PHENYLEPHRINE 40 MCG/ML (10ML) SYRINGE FOR IV PUSH (FOR BLOOD PRESSURE SUPPORT): 80.0000 ug | PREFILLED_SYRINGE | INTRAVENOUS | Status: DC | PRN

## 2021-01-23 MED ORDER — DIPHENHYDRAMINE HCL 25 MG PO CAPS: 25.0000 mg | ORAL_CAPSULE | Freq: Four times a day (QID) | ORAL | Status: DC | PRN

## 2021-01-23 MED ORDER — EPHEDRINE 5 MG/ML INJ: 10.0000 mg | INTRAVENOUS | Status: DC | PRN

## 2021-01-23 MED ORDER — SENNOSIDES-DOCUSATE SODIUM 8.6-50 MG PO TABS
2.0000 | ORAL_TABLET | Freq: Every day | ORAL | Status: DC
  Administered 2021-01-24: 2 via ORAL
  Filled 2021-01-23: qty 2

## 2021-01-23 MED ORDER — ZOLPIDEM TARTRATE 5 MG PO TABS: 5.0000 mg | ORAL_TABLET | Freq: Every evening | ORAL | Status: DC | PRN

## 2021-01-23 MED ORDER — DIBUCAINE (PERIANAL) 1 % EX OINT: 1.0000 "application " | TOPICAL_OINTMENT | CUTANEOUS | Status: DC | PRN

## 2021-01-23 MED ORDER — LACTATED RINGERS IV SOLN
500.0000 mL | Freq: Once | INTRAVENOUS | Status: AC
  Administered 2021-01-23: 500 mL via INTRAVENOUS

## 2021-01-23 MED ORDER — PHENYLEPHRINE 40 MCG/ML (10ML) SYRINGE FOR IV PUSH (FOR BLOOD PRESSURE SUPPORT)
80.0000 ug | PREFILLED_SYRINGE | INTRAVENOUS | Status: DC | PRN
  Filled 2021-01-23: qty 10

## 2021-01-23 MED ORDER — ONDANSETRON HCL 4 MG PO TABS: 4.0000 mg | ORAL_TABLET | ORAL | Status: DC | PRN

## 2021-01-23 MED ORDER — PRENATAL MULTIVITAMIN CH
1.0000 | ORAL_TABLET | Freq: Every day | ORAL | Status: DC
  Administered 2021-01-23: 1 via ORAL
  Filled 2021-01-23: qty 1

## 2021-01-23 MED ORDER — LIDOCAINE HCL (PF) 1 % IJ SOLN
INTRAMUSCULAR | Status: DC | PRN
  Administered 2021-01-23: 3 mL via EPIDURAL

## 2021-01-23 MED ORDER — FENTANYL-BUPIVACAINE-NACL 0.5-0.125-0.9 MG/250ML-% EP SOLN
12.0000 mL/h | EPIDURAL | Status: DC | PRN
  Administered 2021-01-23: 12 mL/h via EPIDURAL
  Filled 2021-01-23: qty 250

## 2021-01-23 MED ORDER — ACETAMINOPHEN 325 MG PO TABS
650.0000 mg | ORAL_TABLET | ORAL | Status: DC | PRN
  Administered 2021-01-23 – 2021-01-24 (×4): 650 mg via ORAL
  Filled 2021-01-23 (×4): qty 2

## 2021-01-23 MED ORDER — SERTRALINE HCL 50 MG PO TABS: 50.0000 mg | ORAL_TABLET | Freq: Every day | ORAL | Status: DC

## 2021-01-23 MED ORDER — IBUPROFEN 600 MG PO TABS
600.0000 mg | ORAL_TABLET | Freq: Four times a day (QID) | ORAL | Status: DC
  Administered 2021-01-23 – 2021-01-24 (×4): 600 mg via ORAL
  Filled 2021-01-23 (×5): qty 1

## 2021-01-23 MED ORDER — COCONUT OIL OIL: 1.0000 "application " | TOPICAL_OIL | Status: DC | PRN

## 2021-01-23 MED ORDER — LIDOCAINE-EPINEPHRINE (PF) 1.5 %-1:200000 IJ SOLN
INTRAMUSCULAR | Status: DC | PRN
  Administered 2021-01-23: 3 mL via EPIDURAL

## 2021-01-23 MED ORDER — OXYCODONE HCL 5 MG PO TABS: 5.0000 mg | ORAL_TABLET | ORAL | Status: DC | PRN

## 2021-01-23 NOTE — Lactation Note (Signed)
This note was copied from a baby's chart. Lactation Consultation Note  Patient Name: Mary Gibbs [id number] Date: 01/23/2021 Reason for consult: Initial assessment;Early term 37-38.6wks Age:36 hours Baby awake after diaper change / 1st tried latching in the football position and  Baby sleepy. Mom switched the baby's position to the cradle and the baby latched with very little assistance with depth and fed for 30 mins with swallows and per mom comfortable at the breast except cramping. Latch score = 8  LC recommended next feeding empty bladder prior to feeding and should help to decrease cramping.  LC provided the [facility] brochure with BF resources after delivery.    Maternal Data Has patient been taught Hand Expression?: Yes Does the patient have breastfeeding experience prior to this delivery?: Yes How long did the patient breastfeed?: per mom 1st baby 6 months . 2nd baby 1 year  Feeding Mother's Current Feeding Choice: Breast Milk  LATCH Score Latch: Grasps breast easily, tongue down, lips flanged, rhythmical sucking.  Audible Swallowing: A few with stimulation  Type of Nipple: Everted at rest and after stimulation  Comfort (Breast/Nipple): Soft / non-tender  Hold (Positioning): Assistance needed to correctly position infant at breast and maintain latch.  LATCH Score: 8   Lactation Tools Discussed/Used    Interventions Interventions: Breast feeding basics reviewed;Assisted with latch;Skin to skin;Hand express;Breast compression;Adjust position;Support pillows;Position options;Education  Discharge Pump: Personal  Consult Status Consult Status: Follow-up Date: 01/24/21 Follow-up type: In-patient    Matilde Sprang Pritika Alvarez 01/23/2021, 3:08 PM

## 2021-01-23 NOTE — Anesthesia Preprocedure Evaluation (Signed)
Anesthesia Evaluation  Patient identified by MRN, date of birth, ID band Patient awake    Reviewed: Allergy & Precautions, H&P , Patient's Chart, lab work & pertinent test results  Airway Mallampati: III  TM Distance: >3 FB Neck ROM: Full    Dental  (+) Teeth Intact   Pulmonary former smoker,    Pulmonary exam normal breath sounds clear to auscultation       Cardiovascular negative cardio ROS Normal cardiovascular exam Rhythm:Regular Rate:Normal     Neuro/Psych PSYCHIATRIC DISORDERS Anxiety Depression    GI/Hepatic Neg liver ROS, hiatal hernia, GERD  ,  Endo/Other  Morbid obesity  Renal/GU negative Renal ROS  negative genitourinary   Musculoskeletal negative musculoskeletal ROS (+)   Abdominal (+) + obese,   Peds  Hematology negative hematology ROS (+) anemia ,   Anesthesia Other Findings   Reproductive/Obstetrics (+) Pregnancy Placenta previa                             Anesthesia Physical  Anesthesia Plan  ASA: 3  Anesthesia Plan: Epidural   Post-op Pain Management:    Induction:   PONV Risk Score and Plan:   Airway Management Planned:   Additional Equipment:   Intra-op Plan:   Post-operative Plan:   Informed Consent: I have reviewed the patients History and Physical, chart, labs and discussed the procedure including the risks, benefits and alternatives for the proposed anesthesia with the patient or authorized representative who has indicated his/her understanding and acceptance.       Plan Discussed with:   Anesthesia Plan Comments:         Anesthesia Quick Evaluation

## 2021-01-23 NOTE — Care Management (Signed)
NAN came to admit baby, she was in the warmer due to low temp. She was also lethargic, so she ran a blood sugar. It was 48, temp at that time was 96.7. baby was given to mom to do skin to skin and she didn't feel comfortable doing that. Baby was placed back in the warmer and the [facility] 4 floor nurse was notified and asked if the baby could be taken to the nursery due to low temp and mother request due to her own lethargic. Donalynn Furlong, RN was notified and we asked what we should to with baby and mom, she instructed [place] to bring baby and mom to [facility]. Spoke to the receiving nurse and she said she would notify nursery about the request for baby to receive formula and stay in the nursery since mom was alone and tired.

## 2021-01-23 NOTE — Anesthesia Procedure Notes (Signed)
Epidural Patient location during procedure: OB Start time: 01/23/2021 2:12 AM End time: 01/23/2021 2:35 AM  Staffing Anesthesiologist: Lewie Loron, MD Performed: anesthesiologist   Preanesthetic Checklist Completed: patient identified, IV checked, risks and benefits discussed, monitors and equipment checked, pre-op evaluation and timeout performed  Epidural Patient position: sitting Prep: DuraPrep and site prepped and draped Patient monitoring: heart rate, continuous pulse ox and blood pressure Approach: midline Location: L3-L4 Injection technique: LOR air and LOR saline  Needle:  Needle type: Tuohy  Needle gauge: 17 G Needle length: 9 cm Needle insertion depth: 7 cm Catheter type: closed end flexible Catheter size: 19 Gauge Catheter at skin depth: 13 cm Test dose: negative  Assessment Sensory level: T8 Events: blood not aspirated, injection not painful, no injection resistance, no paresthesia and negative IV test  Additional Notes Reason for block:procedure for pain

## 2021-01-23 NOTE — Progress Notes (Signed)
DOLORIS SERVANTES is a 35 y.o. [id number] at [redacted]w[redacted]d by ultrasound admitted for rupture of membranes  Subjective: Chief Complaint  Patient presents with   Rupture of Membranes    Objective: BP 136/74   Pulse (!) 145   Temp 98.3 F (36.8 C) (Oral)   Resp 18   Ht 5\' 1"  (1.549 m)   Wt 113.9 kg   LMP 05/06/2020 (Exact Date)   SpO2 99%   BMI 47.43 kg/m  No intake/output data recorded. No intake/output data recorded.  FHT:  FHR: 140 bpm, variability: minimal ,  accelerations:  Abscent,  decelerations:  Absent UC:   regular, every 2 minutes SVE:   10 cm dilated, 100% effaced, +2 station Tracing: cat1  Labs: Lab Results  Component Value Date   WBC 10.2 01/22/2021   HGB 11.2 (L) 01/22/2021   HCT 34.8 (L) 01/22/2021   MCV 93.0 01/22/2021   PLT 266 01/22/2021    Assessment / Plan: Augmentation of labor, progressing well GBS cx (+)  P) start pushing  Anticipated MOD:  NSVD  Arpi Diebold A Latina Frank 01/23/2021, 3:56 AM

## 2021-01-23 NOTE — Anesthesia Postprocedure Evaluation (Signed)
Anesthesia Post Note  Patient: Mary Gibbs  Procedure(s) Performed: AN AD HOC LABOR EPIDURAL     Patient location during evaluation: Mother Baby Anesthesia Type: Epidural Level of consciousness: awake and alert and oriented Pain management: satisfactory to patient Vital Signs Assessment: post-procedure vital signs reviewed and stable Respiratory status: respiratory function stable Cardiovascular status: stable Postop Assessment: no headache, no backache, epidural receding, patient able to bend at knees, no signs of nausea or vomiting, adequate PO intake and able to ambulate Anesthetic complications: no   No notable events documented.  Last Vitals:  Vitals:   01/23/21 0742 01/23/21 1132  BP: 118/73 109/67  Pulse: 74 79  Resp: 18 16  Temp: 36.6 C 36.8 C  SpO2: 100% 99%    Last Pain:  Vitals:   01/23/21 1230  TempSrc:   PainSc: 0-No pain   Pain Goal: Patients Stated Pain Goal: 4 (01/23/21 1134)                 Elimelech Houseman

## 2021-01-24 ENCOUNTER — Encounter (HOSPITAL_COMMUNITY): Payer: Self-pay | Admitting: *Deleted

## 2021-01-24 LAB — CBC
HCT: 28.9 % — ABNORMAL LOW (ref 36.0–46.0)
Hemoglobin: 9.2 g/dL — ABNORMAL LOW (ref 12.0–15.0)
MCH: 30.1 pg (ref 26.0–34.0)
MCHC: 31.8 g/dL (ref 30.0–36.0)
MCV: 94.4 fL (ref 80.0–100.0)
Platelets: 227 10*3/uL (ref 150–400)
RBC: 3.06 MIL/uL — ABNORMAL LOW (ref 3.87–5.11)
RDW: 13.4 % (ref 11.5–15.5)
WBC: 10.7 10*3/uL — ABNORMAL HIGH (ref 4.0–10.5)
nRBC: 0 % (ref 0.0–0.2)

## 2021-01-24 MED ORDER — FERROUS SULFATE 325 (65 FE) MG PO TABS: 325.0000 mg | ORAL_TABLET | Freq: Two times a day (BID) | ORAL | 6 refills | Status: DC

## 2021-01-24 MED ORDER — IBUPROFEN 600 MG PO TABS: 600.0000 mg | ORAL_TABLET | Freq: Four times a day (QID) | ORAL | 11 refills | Status: DC

## 2021-01-24 NOTE — Discharge Summary (Signed)
Postpartum Discharge Summary  Date of Service updated     Patient Name: Mary Gibbs DOB: May 08, 1986 MRN: [id number]  Date of admission: 01/22/2021 Delivery date:01/23/2021  Delivering provider: Lyndy Russman, Alanda Slim  Date of discharge: 01/24/2021  Admitting diagnosis: Indication for care in labor or delivery [O75.9] Postpartum care following vaginal delivery [Z39.2] Intrauterine pregnancy: [redacted]w[redacted]d    Secondary diagnosis:  Active Problems:   Indication for care in labor or delivery   Postpartum care following vaginal delivery  Additional problems: GBS cx positive, depression    Discharge diagnosis: Term Pregnancy Delivered, Anemia, and depression                                               Post partum procedures: none Augmentation: Pitocin Complications: None  Hospital course: Onset of Labor With Vaginal Delivery      35y.o. yo [id number] admitted in Latent Labor  with SROM on 01/22/2021. Patient had an uncomplicated labor course as follows:  Membrane Rupture Time/Date: 6:00 PM ,01/22/2021   Delivery Method:Vaginal, Spontaneous  Episiotomy: None  Lacerations:  1st degree;Perineal  Patient had an uncomplicated postpartum course.  She is ambulating, tolerating a regular diet, passing flatus, and urinating well. Patient is discharged home in stable condition on 01/24/2021  Newborn Data: Birth date:01/23/2021  Birth time:4:00 AM  Gender:Female  Living status:Living  Apgars:9 ,9  Weight:2.88 kg   Magnesium Sulfate received: No BMZ received: Yes Rhophylac:No MMR:No T-DaP:Given prenatally Flu: No Transfusion:No  Physical exam  Vitals:   01/23/21 1132 01/23/21 1535 01/23/21 1930 01/24/21 0500  BP: 109/67 114/65 128/69 117/65  Pulse: 79 75 71 80  Resp: 16 18 18 16   Temp: 98.3 F (36.8 C) 97.9 F (36.6 C) 98 F (36.7 C) 97.7 F (36.5 C)  TempSrc:  Oral Oral   SpO2: 99%  99% 98%  Weight:      Height:       General: alert, cooperative, and no  distress Lochia: appropriate Uterine Fundus: firm Incision: N/A DVT Evaluation: No evidence of DVT seen on physical exam. Calf/Ankle edema is present Labs: Lab Results  Component Value Date   WBC 10.7 (H) 01/24/2021   HGB 9.2 (L) 01/24/2021   HCT 28.9 (L) 01/24/2021   MCV 94.4 01/24/2021   PLT 227 01/24/2021   CMP Latest Ref Rng & Units 04/09/2020  Glucose 65 - 99 mg/dL 98  BUN 6 - 20 mg/dL 11  Creatinine 0.57 - 1.00 mg/dL 0.74  Sodium 134 - 144 mmol/L 138  Potassium 3.5 - 5.2 mmol/L 4.8  Chloride 96 - 106 mmol/L 100  CO2 20 - 29 mmol/L 24  Calcium 8.7 - 10.2 mg/dL 9.9  Total Protein 6.0 - 8.5 g/dL 8.3  Total Bilirubin 0.0 - 1.2 mg/dL 0.3  Alkaline Phos 44 - 121 IU/L 68  AST 0 - 40 IU/L 17  ALT 0 - 32 IU/L 13   Edinburgh Score: Edinburgh Postnatal Depression Scale Screening Tool 01/23/2021  I have been able to laugh and see the funny side of things. 0  I have looked forward with enjoyment to things. 0  I have blamed myself unnecessarily when things went wrong. 2  I have been anxious or worried for no good reason. 2  I have felt scared or panicky for no good reason. 2  Things have been getting  on top of me. 0  I have been so unhappy that I have had difficulty sleeping. 0  I have felt sad or miserable. 0  I have been so unhappy that I have been crying. 0  The thought of harming myself has occurred to me. 0  Edinburgh Postnatal Depression Scale Total 6      After visit meds:  Allergies as of 01/24/2021   No Known Allergies      Medication List     TAKE these medications    Align 4 MG Caps Take 1 capsule (4 mg total) by mouth daily.   ferrous sulfate 325 (65 FE) MG tablet Take 1 tablet (325 mg total) by mouth 2 (two) times daily with a meal.   ibuprofen 600 MG tablet Commonly known as: ADVIL Take 1 tablet (600 mg total) by mouth every 6 (six) hours.   PRENATAL GUMMIES PO Take by mouth.   sertraline 50 MG tablet Commonly known as: ZOLOFT Take 50 mg by  mouth daily.         Discharge home in stable condition Infant Feeding: Bottle Infant Disposition:home with mother Discharge instruction: per After Visit Summary and Postpartum booklet. Activity: Advance as tolerated. Pelvic rest for 6 weeks.  Diet: routine diet Anticipated Birth Control: IUD Postpartum Appointment:6 weeks Additional Postpartum F/U:  none Future Appointments:No future appointments. Follow up Visit:  Follow-up Information     Servando Salina, MD Follow up in 6 week(s).   Specialty: Obstetrics and Gynecology Contact information: [address] [place] [address] [phone]                     01/24/2021 Marvene Staff, MD

## 2021-01-24 NOTE — Progress Notes (Signed)
PPD1 SVD:   S:  Pt reports feeling well request early discharge/ Tolerating po/ Voiding without problems/ No n/v/ Bleeding is light/ Pain controlled withprescription NSAID's including ibuprofen (Motrin)  Newborn info live female   O:  A & O x 3 / VS: Blood pressure 117/65, pulse 80, temperature 97.7 F (36.5 C), resp. rate 16, height 5\' 1"  (1.549 m), weight 113.9 kg, last menstrual period 05/06/2020, SpO2 98 %.  LABS:  Results for orders placed or performed during the hospital encounter of 01/22/21 (from the past 24 hour(s))  CBC     Status: Abnormal   Collection Time: 01/24/21  4:47 AM  Result Value Ref Range   WBC 10.7 (H) 4.0 - 10.5 K/uL   RBC 3.06 (L) 3.87 - 5.11 MIL/uL   Hemoglobin 9.2 (L) 12.0 - 15.0 g/dL   HCT 01/26/21 (L) 66.2 - 94.7 %   MCV 94.4 80.0 - 100.0 fL   MCH 30.1 26.0 - 34.0 pg   MCHC 31.8 30.0 - 36.0 g/dL   RDW 65.4 65.0 - 35.4 %   Platelets 227 150 - 400 K/uL   nRBC 0.0 0.0 - 0.2 %    I&O: I/O last 3 completed shifts: In: -  Out: 850 [Urine:700; Blood:150]   No intake/output data recorded.  Lungs: chest clear, no wheezing, rales, normal symmetric air entry  Heart: regular rate and rhythm, S1, S2 normal, no murmur, click, rub or gallop  Abdomen: obese soft  Uterus @FB  below umb  Perineum: healing with good reapproximation  Lochia: light  Extremities:no redness or tenderness in the calves or thighs, no edema    A/P: PPD # 1/ 65.6  Doing well  Continue routine post partum orders  D/c instructions reviewed  Plans IUD F/u 6 wk

## 2021-01-24 NOTE — Discharge Instructions (Signed)
Call if temperature greater than equal to 100.4, nothing per vagina for 4-6 weeks or severe nausea vomiting, increased incisional pain , drainage or redness in the incision site, no straining with bowel movements, showers no bath °

## 2021-01-31 DIAGNOSIS — F4323 Adjustment disorder with mixed anxiety and depressed mood: Secondary | ICD-10-CM | POA: Diagnosis not present

## 2021-02-05 ENCOUNTER — Telehealth (HOSPITAL_COMMUNITY): Payer: Self-pay

## 2021-02-05 NOTE — Telephone Encounter (Signed)
  No answer. Left message to return nurse call.  Marcelino Duster [facility] 02/05/2021,1948

## 2021-02-15 ENCOUNTER — Encounter: Payer: Self-pay | Admitting: Internal Medicine

## 2021-02-15 DIAGNOSIS — F4323 Adjustment disorder with mixed anxiety and depressed mood: Secondary | ICD-10-CM | POA: Diagnosis not present

## 2021-02-28 DIAGNOSIS — F4323 Adjustment disorder with mixed anxiety and depressed mood: Secondary | ICD-10-CM | POA: Diagnosis not present

## 2021-03-06 DIAGNOSIS — F32A Depression, unspecified: Secondary | ICD-10-CM | POA: Diagnosis not present

## 2021-03-07 DIAGNOSIS — Z30014 Encounter for initial prescription of intrauterine contraceptive device: Secondary | ICD-10-CM | POA: Diagnosis not present

## 2021-03-22 DIAGNOSIS — F4323 Adjustment disorder with mixed anxiety and depressed mood: Secondary | ICD-10-CM | POA: Diagnosis not present

## 2021-04-01 DIAGNOSIS — M238X1 Other internal derangements of right knee: Secondary | ICD-10-CM | POA: Diagnosis not present

## 2021-04-01 DIAGNOSIS — M25561 Pain in right knee: Secondary | ICD-10-CM | POA: Diagnosis not present

## 2021-04-03 DIAGNOSIS — F4323 Adjustment disorder with mixed anxiety and depressed mood: Secondary | ICD-10-CM | POA: Diagnosis not present

## 2021-04-17 DIAGNOSIS — F4323 Adjustment disorder with mixed anxiety and depressed mood: Secondary | ICD-10-CM | POA: Diagnosis not present

## 2021-05-01 DIAGNOSIS — F4323 Adjustment disorder with mixed anxiety and depressed mood: Secondary | ICD-10-CM | POA: Diagnosis not present

## 2021-05-01 DIAGNOSIS — M25561 Pain in right knee: Secondary | ICD-10-CM | POA: Diagnosis not present

## 2021-05-09 DIAGNOSIS — F4323 Adjustment disorder with mixed anxiety and depressed mood: Secondary | ICD-10-CM | POA: Diagnosis not present

## 2021-05-14 DIAGNOSIS — S83521A Sprain of posterior cruciate ligament of right knee, initial encounter: Secondary | ICD-10-CM | POA: Diagnosis not present

## 2021-05-15 ENCOUNTER — Telehealth: Payer: Self-pay

## 2021-05-15 NOTE — Telephone Encounter (Signed)
Notes scanned to referral 

## 2021-05-22 ENCOUNTER — Ambulatory Visit: Payer: BC Managed Care – PPO | Attending: Orthopedic Surgery

## 2021-05-22 ENCOUNTER — Other Ambulatory Visit: Payer: Self-pay

## 2021-05-22 DIAGNOSIS — M6281 Muscle weakness (generalized): Secondary | ICD-10-CM | POA: Insufficient documentation

## 2021-05-22 DIAGNOSIS — X58XXXS Exposure to other specified factors, sequela: Secondary | ICD-10-CM | POA: Diagnosis not present

## 2021-05-22 DIAGNOSIS — R2689 Other abnormalities of gait and mobility: Secondary | ICD-10-CM | POA: Diagnosis not present

## 2021-05-22 DIAGNOSIS — S83511S Sprain of anterior cruciate ligament of right knee, sequela: Secondary | ICD-10-CM | POA: Insufficient documentation

## 2021-05-22 DIAGNOSIS — R262 Difficulty in walking, not elsewhere classified: Secondary | ICD-10-CM | POA: Insufficient documentation

## 2021-05-22 DIAGNOSIS — S83521S Sprain of posterior cruciate ligament of right knee, sequela: Secondary | ICD-10-CM | POA: Diagnosis not present

## 2021-05-22 DIAGNOSIS — M25661 Stiffness of right knee, not elsewhere classified: Secondary | ICD-10-CM | POA: Insufficient documentation

## 2021-05-22 NOTE — Therapy (Addendum)
OUTPATIENT PHYSICAL THERAPY LOWER EXTREMITY EVALUATION   Patient Name: Mary Gibbs MRN: [id number] DOB:07-22-1985, 35 y.o., female Today's Date: 05/22/2021   PT End of Session - 05/22/21 1535     Visit Number 1    Number of Visits 13    Date for PT Re-Evaluation 07/10/21    Authorization Type BCBS COMM PPO    Progress Note Due on Visit 10    PT Start Time 1340    PT Stop Time 1435    PT Time Calculation (min) 55 min    Activity Tolerance Patient tolerated treatment well    Behavior During Therapy WFL for tasks assessed/performed             Past Medical History:  Diagnosis Date   Anemia    @ last [facility] appt   Depression     no meds currently, was thinking of going to behavior health   H/O hiatal hernia    Hx of ulcer disease    Hx of varicella    Infection    UTI   NSVD (normal spontaneous vaginal delivery) 08/26/2012   Past Surgical History:  Procedure Laterality Date   CHOLECYSTECTOMY N/A 06/11/2017   Procedure: LAPAROSCOPIC CHOLECYSTECTOMY WITH INTRAOPERATIVE CHOLANGIOGRAM;  Surgeon: Donnie Mesa, MD;  Location: [place];  Service: General;  Laterality: N/A;   FOOT SURGERY Left    broken toe, ligament removed from back of leg   TONSILLECTOMY     As a baby   Patient Active Problem List   Diagnosis Date Noted   Postpartum care following vaginal delivery 01/23/2021   Indication for care in labor or delivery 01/22/2021   Dysthymia 06/12/2020   Abnormal urine odor 06/12/2020   Positive pregnancy test 06/12/2020   Vaccine counseling 04/09/2020   Encounter for screening for malignant neoplasm of breast 04/09/2020   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   Anxiety 04/09/2020   Hematuria 03/29/2018   Impaired fasting blood sugar 03/29/2018   Symptomatic cholelithiasis 06/11/2017   Anemia, iron deficiency 08/28/2015   Encounter for health maintenance examination in adult 08/28/2015   Pain of upper abdomen 08/28/2015   Influenza vaccination declined 08/28/2015   NSVD  (normal spontaneous vaginal delivery) 08/26/2012   Perineal laceration with delivery, second degree 08/26/2012   Obesity with serious comorbidity 06/14/2012    PCP: Carlena Hurl, PA-C  REFERRING PROVIDER: Nicholes Stairs, *  REFERRING DIAG: Rupture of ACL, rupture of [facility]  THERAPY DIAG: Muscle weakness (generalized) - Plan: PT plan of care cert/re-cert  Decreased ROM of right knee - Plan: PT plan of care cert/re-cert  Difficulty in walking, not elsewhere classified - Plan: PT plan of care cert/re-cert  Other abnormalities of gait and mobility - Plan: PT plan of care cert/re-cert    ONSET DATE: [id number]  SUBJECTIVE:   SUBJECTIVE STATEMENT: Injuried stepping off a low step she did not notice and fell. My R knee is stiff and if I step a certain way, I have sharp pain. I need to be more mobile, I have 3 children to care for with the youngest 4 months. I would also like to be more active and lose weight.  PERTINENT HISTORY: surgery of the L foot 2021  PAIN:  Are you having pain? Yes VAS scale: 6/10, pain range 3-10/10 brief sharp Pain location: R knee Pain orientation: right PAIN TYPE: aching, sharp, and throbbing Pain description: constant  Aggravating factors: Certain steps, prolonged standing Relieving factors: Rest, med, cold pack, eliptical  PRECAUTIONS: Knee,  running, jumping, lunges, squats. Pt reports she has a soft knee brace to wear as needed.  WEIGHT BEARING RESTRICTIONS No  FALLS:  Has patient fallen in last 6 months? Yes, Number of falls: 1, related to misstep on a step. No other falls.  LIVING ENVIRONMENT: Lives with: lives with their family, lives with their partner, and 3 children Lives in: House/apartment Stairs: Yes; External: 1 steps; Rail on 0 going up. 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   OBJECTIVE:   DIAGNOSTIC FINDINGS: MRI was completed 05/01/21, not available   PATIENT SURVEYS:  FOTO 38% functional ability  COGNITION:  Overall cognitive status: Within functional limits for tasks assessed     SENSATION:  Light touch: Appears intact     MUSCLE LENGTH: Hamstrings: Right TBA deg; Left TBA deg   POSTURE:   Valgus positioning both knees  PALPATION: TTP posterior r knee  LE AROM/PROM:  A/PROM Right 05/22/2021 Left 05/22/2021  Knee flexion 110 125  Knee extension 0 0    LE MMT:  MMT Right 05/22/2021 Left 05/22/2021  Hip flexion 4 4+  Hip extension 4 4+  Hip abduction 4+ 5  Hip adduction 5 5  Hip internal rotation 5 5  Hip external rotation 4+ 5  Knee flexion 4+ 5  Knee extension 4 pain 5  Ankle dorsiflexion    Ankle plantarflexion    Ankle inversion    Ankle eversion     (Blank rows = not tested)  LOWER EXTREMITY SPECIAL TESTS:  Knee special tests: Anterior drawer test: positive , Posterior drawer test: positive , and McMurray's test: negative Anterior and posterior drawer tests- grade 1 laxity  FUNCTIONAL TESTS:  5 times sit to stand: TBA 6 minute walk test: TBA  GAIT: Distance walked: Within clinic Assistive device utilized: None Level of assistance: Complete Independence Comments: Pt walks with an antalgic gait pattern over the R LE    TODAY'S TREATMENT: R SLR c quad set x10 R Heel slide x10 then x2 30" stretch R prone knee flexion x10, then RTB x10   PATIENT EDUCATION:  Education details: Eval findings, POC, HEP, RICE for symptom management Person educated: Patient Education method: Explanation, Demonstration, Tactile cues, Verbal cues, and Handouts Education comprehension: verbalized understanding, returned demonstration, verbal cues required, and tactile cues required   HOME EXERCISE PROGRAM: Access Code: [email or web address] URL:  [email or web address] Date: 05/22/2021 Prepared by: Joellyn Rued  Exercises Supine Heel Slide - 2 x daily - 7 x weekly - 1 sets - 3 reps - 30 hold Active Straight Leg Raise with Quad Set - 2 x daily - 7 x weekly - 2 sets - 10-15 reps - 3 hold Prone Knee Flexion - 2 x daily - 7 x weekly - 2 sets - 10-15 reps - 3 hold   ASSESSMENT:  CLINICAL IMPRESSION: Patient is a 35 y.o. F who was seen today for physical therapy evaluation and treatment for R anterior and posterior cruciate ligament ruptures. Objective impairments include Abnormal gait, decreased activity tolerance, difficulty walking, decreased ROM, decreased strength, obesity, and pain. These impairments are limiting patient from occupation and caring for family and home. Personal factors including 1-2 comorbidities: obesity and depression  are also affecting patient's functional outcome. Patient will benefit from skilled PT  to address above impairments and improve overall function.  REHAB POTENTIAL: Good  CLINICAL DECISION MAKING: Stable/uncomplicated  EVALUATION COMPLEXITY: Low   GOALS:   SHORT TERM GOALS:  STG Name Target Date Goal status  1 Pt will be Ind in an initial HEP Baseline:  06/12/21 INITIAL  2 Pt will voice understanding of measures to assit in the reduction of pain. Baseline:  06/12/21 INITIAL   LONG TERM GOALS:   LTG Name Target Date Goal status  1 Increase R knee flexion to 120d for improved R knee function Baseline:110d 07/09/21 INITIAL  2 Increased R hip flexion and ext strength to 4+/5 or greater and knee ext and flex strength to 5/5 for improved knee function. Baseline: 07/09/21 INITIAL  3 Pt will report a decrease in R knee pain to 3/10 or less with daily activities. Baseline: 3-10/10 07/09/21 INITIAL  4 Pt's functional ability FOTO score will increase to 67% Baseline: 38% 07/09/21 INITIAL  5 Assess 5xSTS and 6 min walking test and set LTG Baseline: 07/09/21 INITIAL  6 Develop higher level  functional goals as indicated Baseline: 07/09/21 INITIAL  7  Baseline:     PLAN: PT FREQUENCY: 2x/week  PT DURATION: 6 weeks  PLANNED INTERVENTIONS: Therapeutic exercises, Therapeutic activity, Neuro Muscular re-education, Balance training, Gait training, Patient/Family education, Joint mobilization, Stair training, Dry Needling, Electrical stimulation, Cryotherapy, Moist heat, Taping, Vasopneumatic device, Ultrasound, Ionotophoresis 4mg /ml Dexamethasone, and Manual therapy  PLAN FOR NEXT SESSION: Review FOTO, assess 5XSTS and 6 min walking test and set goal, assess response to HEP and progress there ex as indicated   [employer] MS, PT 05/22/21 10:02 PM    Starr Lake PT, DPT, LAT, ATC  06/17/21  4:19 PM

## 2021-05-28 ENCOUNTER — Other Ambulatory Visit: Payer: Self-pay

## 2021-05-28 ENCOUNTER — Ambulatory Visit: Payer: BC Managed Care – PPO | Admitting: Physical Therapy

## 2021-05-28 DIAGNOSIS — R2689 Other abnormalities of gait and mobility: Secondary | ICD-10-CM

## 2021-05-28 DIAGNOSIS — S83511S Sprain of anterior cruciate ligament of right knee, sequela: Secondary | ICD-10-CM | POA: Diagnosis not present

## 2021-05-28 DIAGNOSIS — X58XXXS Exposure to other specified factors, sequela: Secondary | ICD-10-CM | POA: Diagnosis not present

## 2021-05-28 DIAGNOSIS — R262 Difficulty in walking, not elsewhere classified: Secondary | ICD-10-CM | POA: Diagnosis not present

## 2021-05-28 DIAGNOSIS — M6281 Muscle weakness (generalized): Secondary | ICD-10-CM

## 2021-05-28 DIAGNOSIS — M25661 Stiffness of right knee, not elsewhere classified: Secondary | ICD-10-CM

## 2021-05-28 DIAGNOSIS — S83521S Sprain of posterior cruciate ligament of right knee, sequela: Secondary | ICD-10-CM | POA: Diagnosis not present

## 2021-05-28 NOTE — Therapy (Addendum)
OUTPATIENT PHYSICAL THERAPY TREATMENT NOTE   Patient Name: Mary Gibbs MRN: [id number] DOB:02/16/1986, 35 y.o., female Today's Date: 05/28/2021  PCP: Jac Canavan, PA-C REFERRING PROVIDER: Jac Canavan, PA-C   PT End of Session - 05/28/21 1714     Visit Number 2    Number of Visits 13    Date for PT Re-Evaluation 07/10/21    Authorization Type BCBS COMM PPO    PT Start Time 1500    PT Stop Time 1544    PT Time Calculation (min) 44 min    Activity Tolerance Patient tolerated treatment well    Behavior During Therapy WFL for tasks assessed/performed             Past Medical History:  Diagnosis Date   Anemia    @ last [facility] appt   Depression     no meds currently, was thinking of going to behavior health   H/O hiatal hernia    Hx of ulcer disease    Hx of varicella    Infection    UTI   NSVD (normal spontaneous vaginal delivery) 08/26/2012   Past Surgical History:  Procedure Laterality Date   CHOLECYSTECTOMY N/A 06/11/2017   Procedure: LAPAROSCOPIC CHOLECYSTECTOMY WITH INTRAOPERATIVE CHOLANGIOGRAM;  Surgeon: Manus Rudd, MD;  Location: [facility];  Service: General;  Laterality: N/A;   FOOT SURGERY Left    broken toe, ligament removed from back of leg   TONSILLECTOMY     As a baby   Patient Active Problem List   Diagnosis Date Noted   Postpartum care following vaginal delivery 01/23/2021   Indication for care in labor or delivery 01/22/2021   Dysthymia 06/12/2020   Abnormal urine odor 06/12/2020   Positive pregnancy test 06/12/2020   Vaccine counseling 04/09/2020   Encounter for screening for malignant neoplasm of breast 04/09/2020   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   Anxiety 04/09/2020   Hematuria 03/29/2018   Impaired fasting blood sugar 03/29/2018   Symptomatic cholelithiasis 06/11/2017   Anemia, iron deficiency 08/28/2015   Encounter for health maintenance examination in adult 08/28/2015   Pain of upper abdomen 08/28/2015   Influenza vaccination  declined 08/28/2015   NSVD (normal spontaneous vaginal delivery) 08/26/2012   Perineal laceration with delivery, second degree 08/26/2012   Obesity with serious comorbidity 06/14/2012    REFERRING DIAG: Rupture of ACL, rupture of PLC  THERAPY DIAG:  Muscle weakness (generalized)  Decreased ROM of right knee  Difficulty in walking, not elsewhere classified  Other abnormalities of gait and mobility  PERTINENT HISTORY: surgery of the L foot 2021  PRECAUTIONS: Knee, running, jumping, lunges, squats. Pt reports she has a soft knee brace to wear as needed.  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 I dont wear my soft knee brace because it is too lose and drops to the floor.  I could not get on the floor.  I felt really good last time I was here with Freida Busman  PAIN:  Are you having pain? Yes VAS scale: 6/10 Pain location: Right knee Pain orientation: Posterior  PAIN TYPE: aching and dull Pain description: intermittent  Aggravating factors: the cold makes the pain worse Relieving factors: I never have time to relieve my pain because I am taking care of 3 kids     OBJECTIVE:   DIAGNOSTIC FINDINGS: MRI was completed 05/01/21, not available   PATIENT SURVEYS:  FOTO 38% functional ability  COGNITION:  Overall cognitive status: Within functional  limits for tasks assessed     SENSATION:  Light touch: Appears intact     MUSCLE LENGTH: Hamstrings: Right TBA deg; Left TBA deg   POSTURE:   Valgus positioning both knees  PALPATION: TTP posterior r knee  LE AROM/PROM:  A/PROM Right 05/22/2021 Left 05/22/2021 Right 05-28-21 Left 05-28-21  Knee flexion 110 125 120 128  Knee extension 0 0 0 0    LE MMT:  MMT Right 05/22/2021 Left 05/22/2021  Hip flexion 4 4+  Hip extension 4 4+  Hip abduction 4+ 5  Hip adduction 5 5  Hip internal rotation 5 5  Hip external rotation 4+ 5  Knee flexion 4+ 5  Knee extension 4 pain 5  Ankle dorsiflexion     Ankle plantarflexion    Ankle inversion    Ankle eversion     (Blank rows = not tested)  LOWER EXTREMITY SPECIAL TESTS:  Knee special tests: Anterior drawer test: positive , Posterior drawer test: positive , and McMurray's test: negative Anterior and posterior drawer tests- grade 1 laxity  FUNCTIONAL TESTS:  5 times sit to stand: taken on 05-28-21  11.56 sec 6 minute walk test: 1181Ft  ( normal for age about 2200 ft)   GAIT: Distance walked: Within clinic Assistive device utilized: None Level of assistance: Complete Independence Comments: Pt walks with an antalgic gait pattern over the R LE    TODAY'S TREATMENT:  [facility] Adult PT Treatment:                                          DATE: 05-28-21  Objective measure for 5 x sts  10.56 sec Therapeutic Exercise:  Quad set on R 3 x 10 R SLR c quad set  3 x10 5 sec hold R Heel slide x10 then x2 30" stretch R prone knee flexion x10, then RTB 2 x10 Gastroc stretch against wall,  Pelvic floor exercise with diaphragmatic breathing Clamshell with red t band Wall sit x 5          PATIENT EDUCATION:  Added to HEP and explanation of 6 MWT and 5 x STS, FOTO Report   HOME EXERCISE PROGRAM: Access Code: [email or web address] URL: [email or web address] Date: 05/28/2021 Prepared by: Voncille Lo   Exercises Supine Heel Slide - 2 x daily - 7 x weekly - 1 sets - 3 reps - 30 hold Active Straight Leg Raise with Quad Set - 2 x daily - 7 x weekly - 2 sets - 10-15 reps - 3 hold Prone Knee Flexion - 2 x daily - 7 x weekly - 2 sets - 10-15 reps - 3 hold Clam with Resistance - 1 x daily - 7 x weekly - 3 sets - 10 reps Gastroc Stretch on Wall - 1 x daily - 7 x weekly - 1 sets - 3 reps - 20-30 hold   ASSESSMENT:  CLINICAL IMPRESSION:  Ms Roxy Manns enters clinic with 6/10 pain in post knee but is able to perform exercises and decrease pain as well as perform 6 MWT with 1180 ft today. 5 x STS for 1056 sec.  Pt R knee flexion to 120  today 10 degrees more than eval.  Pt is encouraged about progress. Will continue toward completion of goals  REHAB POTENTIAL: Good  CLINICAL DECISION MAKING: Stable/uncomplicated  EVALUATION COMPLEXITY: Low   GOALS:   SHORT TERM GOALS:  STG Name Target Date  Goal status  1 Pt will be Ind in an initial HEP Baseline:  06/12/21 INITIAL  2 Pt will voice understanding of measures to assit in the reduction of pain. Baseline:  06/12/21 INITIAL   LONG TERM GOALS:   LTG Name Target Date Goal status  1 Increase R knee flexion to 120d for improved R knee function Baseline:110d 07/09/21 INITIAL  2 Increased R hip flexion and ext strength to 4+/5 or greater and knee ext and flex strength to 5/5 for improved knee function. Baseline: 07/09/21 INITIAL  3 Pt will report a decrease in R knee pain to 3/10 or less with daily activities. Baseline: 3-10/10 07/09/21 INITIAL  4 Pt's functional ability FOTO score will increase to 67% Baseline: 38% 07/09/21 INITIAL  5 Assess 5xSTS and 6 min walking test and set LTG Baseline: 07/09/21 INITIAL  6 Develop higher level functional goals as indicated Baseline: 07/09/21 INITIAL  7  Baseline:     PLAN: PT FREQUENCY: 2x/week  PT DURATION: 6 weeks  PLANNED INTERVENTIONS: Therapeutic exercises, Therapeutic activity, Neuro Muscular re-education, Balance training, Gait training, Patient/Family education, Joint mobilization, Stair training, Dry Needling, Electrical stimulation, Cryotherapy, Moist heat, Taping, Vasopneumatic device, Ultrasound, Ionotophoresis 4mg /ml Dexamethasone, and Manual therapy  PLAN FOR NEXT SESSION: Review FOTO, assess 5XSTS and 6 min walking test and set goal, assess response to HEP and progress there ex as indicated  Access Code: [email or web address] URL: [email or web address] Date: 05/28/2021 Prepared by: Voncille Lo  Exercises Supine Heel Slide - 2 x daily - 7 x weekly - 1 sets - 3 reps - 30 hold Active Straight Leg Raise with Quad  Set - 2 x daily - 7 x weekly - 2 sets - 10-15 reps - 3 hold Prone Knee Flexion - 2 x daily - 7 x weekly - 2 sets - 10-15 reps - 3 hold Clam with Resistance - 1 x daily - 7 x weekly - 3 sets - 10 reps Gastroc Stretch on Wall - 1 x daily - 7 x weekly - 1 sets - 3 reps - 20-30 hold   Voncille Lo, PT, [place] Certified Exercise Expert for the Aging Adult  05/28/21 5:20 PM Phone: [phone] Fax: [phone]

## 2021-05-28 NOTE — Patient Instructions (Signed)
Access Code: [email or web address] URL: [email or web address] Date: 05/28/2021 Prepared by: Garen Lah  Exercises Supine Heel Slide - 2 x daily - 7 x weekly - 1 sets - 3 reps - 30 hold Active Straight Leg Raise with Quad Set - 2 x daily - 7 x weekly - 2 sets - 10-15 reps - 3 hold Prone Knee Flexion - 2 x daily - 7 x weekly - 2 sets - 10-15 reps - 3 hold Clam with Resistance - 1 x daily - 7 x weekly - 3 sets - 10 reps Gastroc Stretch on Wall - 1 x daily - 7 x weekly - 1 sets - 3 reps - 20-30 hold   Garen Lah, PT, [facility] Certified Exercise Expert for the Aging Adult  05/28/21 3:51 PM Phone: [phone] Fax: [phone]

## 2021-05-30 ENCOUNTER — Ambulatory Visit: Payer: BC Managed Care – PPO

## 2021-05-30 ENCOUNTER — Other Ambulatory Visit: Payer: Self-pay

## 2021-05-30 DIAGNOSIS — R2689 Other abnormalities of gait and mobility: Secondary | ICD-10-CM | POA: Diagnosis not present

## 2021-05-30 DIAGNOSIS — X58XXXS Exposure to other specified factors, sequela: Secondary | ICD-10-CM | POA: Diagnosis not present

## 2021-05-30 DIAGNOSIS — R262 Difficulty in walking, not elsewhere classified: Secondary | ICD-10-CM | POA: Diagnosis not present

## 2021-05-30 DIAGNOSIS — S83521S Sprain of posterior cruciate ligament of right knee, sequela: Secondary | ICD-10-CM | POA: Diagnosis not present

## 2021-05-30 DIAGNOSIS — M25661 Stiffness of right knee, not elsewhere classified: Secondary | ICD-10-CM

## 2021-05-30 DIAGNOSIS — S83511S Sprain of anterior cruciate ligament of right knee, sequela: Secondary | ICD-10-CM | POA: Diagnosis not present

## 2021-05-30 DIAGNOSIS — M6281 Muscle weakness (generalized): Secondary | ICD-10-CM | POA: Diagnosis not present

## 2021-05-30 NOTE — Therapy (Addendum)
OUTPATIENT PHYSICAL THERAPY TREATMENT NOTE   Patient Name: Mary Gibbs MRN: [id number] DOB:Dec 24, 1985, 35 y.o., female Today's Date: 05/30/2021  PCP: Jac Canavan, PA-C REFERRING PROVIDER: Yolonda Kida, *   PT End of Session - 05/30/21 0953     Visit Number 3    Number of Visits 13    Date for PT Re-Evaluation 07/10/21    Authorization Type [employer] COMM PPO    Progress Note Due on Visit 10    PT Start Time 0945   Pt was 13 mins late for appt   PT Stop Time 1019    PT Time Calculation (min) 34 min    Activity Tolerance Patient tolerated treatment well    Behavior During Therapy WFL for tasks assessed/performed             Past Medical History:  Diagnosis Date   Anemia    @ last [facility] appt   Depression     no meds currently, was thinking of going to behavior health   H/O hiatal hernia    Hx of ulcer disease    Hx of varicella    Infection    UTI   NSVD (normal spontaneous vaginal delivery) 08/26/2012   Past Surgical History:  Procedure Laterality Date   CHOLECYSTECTOMY N/A 06/11/2017   Procedure: LAPAROSCOPIC CHOLECYSTECTOMY WITH INTRAOPERATIVE CHOLANGIOGRAM;  Surgeon: Manus Rudd, MD;  Location: [facility];  Service: General;  Laterality: N/A;   FOOT SURGERY Left    broken toe, ligament removed from back of leg   TONSILLECTOMY     As a baby   Patient Active Problem List   Diagnosis Date Noted   Postpartum care following vaginal delivery 01/23/2021   Indication for care in labor or delivery 01/22/2021   Dysthymia 06/12/2020   Abnormal urine odor 06/12/2020   Positive pregnancy test 06/12/2020   Vaccine counseling 04/09/2020   Encounter for screening for malignant neoplasm of breast 04/09/2020   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   Anxiety 04/09/2020   Hematuria 03/29/2018   Impaired fasting blood sugar 03/29/2018   Symptomatic cholelithiasis 06/11/2017   Anemia, iron deficiency 08/28/2015   Encounter for health maintenance examination in adult 08/28/2015    Pain of upper abdomen 08/28/2015   Influenza vaccination declined 08/28/2015   NSVD (normal spontaneous vaginal delivery) 08/26/2012   Perineal laceration with delivery, second degree 08/26/2012   Obesity with serious comorbidity 06/14/2012    REFERRING DIAG: Rupture of ACL, rupture of PLC  THERAPY DIAG:  (generalized) - Plan: PT plan of care cert/re-cert   Decreased ROM of right knee - Plan: PT plan of care cert/re-cert   Difficulty in walking, not elsewhere classified - Plan: PT plan of care cert/re-cert   Other abnormalities of gait and mobility - Plan: PT plan of care cert/re-cert  PERTINENT HISTORY: surgery of the L foot 2021  PRECAUTIONS: Knee, running, jumping, lunges, squats. Pt reports she has a soft knee brace to wear as needed.  SUBJECTIVE: My R knee is doing better. My pain is lower and my R leg feels stronger.  PAIN:  Are you having pain? Yes VAS scale: 0/10 Pain location: Right knee Pain orientation: Posterior  PAIN TYPE: aching and dull Pain description: intermittent  Aggravating factors: the cold makes the pain worse Relieving factors: I never have time to relieve my pain because I am taking care of 3 kids  OBJECTIVE:   DIAGNOSTIC FINDINGS: MRI was completed 05/01/21, not available   PATIENT SURVEYS:  FOTO 38%  functional ability  COGNITION:  Overall cognitive status: Within functional limits for tasks assessed     SENSATION:  Light touch: Appears intact     MUSCLE LENGTH: Hamstrings: Right TBA deg; Left TBA deg   POSTURE:   Valgus positioning both knees  PALPATION: TTP posterior r knee  LE AROM/PROM:  A/PROM Right 05/22/2021 Left 05/22/2021 Right 05-28-21 Left 05-28-21  Knee flexion 110 125 120 128  Knee extension 0 0 0 0    LE MMT:  MMT Right 05/22/2021 Left 05/22/2021  Hip flexion 4 4+  Hip extension 4 4+  Hip abduction 4+ 5  Hip adduction 5 5  Hip internal rotation 5 5  Hip external rotation 4+ 5  Knee flexion 4+ 5   Knee extension 4 pain 5  Ankle dorsiflexion    Ankle plantarflexion    Ankle inversion    Ankle eversion     (Blank rows = not tested)  LOWER EXTREMITY SPECIAL TESTS:  Knee special tests: Anterior drawer test: positive , Posterior drawer test: positive , and McMurray's test: negative Anterior and posterior drawer tests- grade 1 laxity  FUNCTIONAL TESTS:  5 times sit to stand: taken on 05-28-21  11.56 sec 6 minute walk test: 1181Ft  ( normal for age about 2200 ft)   GAIT: Distance walked: Within clinic Assistive device utilized: None Level of assistance: Complete Independence Comments: Pt walks with an antalgic gait pattern over the R LE   [facility] Adult PT Treatment:                             DATE: 05/30/21  Therapeutic Exercise: Nu-step 5 mins, L6, UE/LE  R SLR c quad set  1 x 15 sec hold R prone knee flexion 1x15, 5# Seated LAQ x15, 5# Gastroc stretch against wall, x2, 30" ea Pelvic floor exercise with diaphragmatic breathing Clamshell with GTB band, 1x15,  Wall sit x2, 30 sec  Self Care: Recommended obtaining 5# ankle weights for resistance with HEP     TODAY'S TREATMENT:  [facility] Adult PT Treatment:                                          DATE: 05-28-21  Objective measure for 5 x sts  10.56 sec Therapeutic Exercise:  Quad set on R 3 x 10 R SLR c quad set  3 x10 5 sec hold R Heel slide x10 then x2 30" stretch R prone knee flexion 1 x 10, then RTB 2 x10 Gastroc stretch against wall, x2, 30" ea Pelvic floor exercise with diaphragmatic breathing Clamshell with GTB 1 x 15 Wall sit x2, 30 sec         PATIENT EDUCATION:  Added to HEP and explanation of 6 MWT and 5 x STS, FOTO Report   HOME EXERCISE PROGRAM: Access Code: [email or web address] URL: [email or web address] Date: 05/30/2021 Prepared by: Gar Ponto  Exercises Supine Heel Slide - 2 x daily - 7 x weekly - 1 sets - 3 reps - 30 hold Active Straight Leg Raise with Quad Set - 2 x daily - 7  x weekly - 2 sets - 10-15 reps - 3 hold Prone Knee Flexion - 2 x daily - 7 x weekly - 2 sets - 10-15 reps - 3 hold Clam with Resistance - 1 x daily - 7 x weekly - 3 sets -  10 reps Gastroc Stretch on Wall - 1 x daily - 7 x weekly - 1 sets - 3 reps - 20-30 hold Wall Squat with Swiss Ball - 1 x daily - 7 x weekly - 3 sets - 10 reps    ASSESSMENT:  CLINICAL IMPRESSION: Pt presents to PT walking with an improved gait pattern s and antalgic gait pattern. The PT session time was limited with the pt arriving late. PT was completed for LE strengthening and flexibility. Pt tolerated the session s adverse effects. Pt is progressing appropriately re: R knee pain and function.  REHAB POTENTIAL: Good  CLINICAL DECISION MAKING: Stable/uncomplicated  EVALUATION COMPLEXITY: Low   GOALS:   SHORT TERM GOALS:  STG Name Target Date Goal status  1 Pt will be Ind in an initial HEP Baseline:  06/12/21 INITIAL  2 Pt will voice understanding of measures to assit in the reduction of pain. Baseline:  06/12/21 INITIAL   LONG TERM GOALS:   LTG Name Target Date Goal status  1 Increase R knee flexion to 120d for improved R knee function Baseline:110d 07/09/21 INITIAL  2 Increased R hip flexion and ext strength to 4+/5 or greater and knee ext and flex strength to 5/5 for improved knee function. Baseline: 07/09/21 INITIAL  3 Pt will report a decrease in R knee pain to 3/10 or less with daily activities. Baseline: 3-10/10 07/09/21 INITIAL  4 Pt's functional ability FOTO score will increase to 67% Baseline: 38% 07/09/21 INITIAL  5 Assess 5xSTS and 6 min walking test and set LTG Baseline: 07/09/21 INITIAL  6 Develop higher level functional goals as indicated Baseline: 07/09/21 INITIAL  7  Baseline:     PLAN: PT FREQUENCY: 2x/week  PT DURATION: 6 weeks  PLANNED INTERVENTIONS: Therapeutic exercises, Therapeutic activity, Neuro Muscular re-education, Balance training, Gait training, Patient/Family education, Joint  mobilization, Stair training, Dry Needling, Electrical stimulation, Cryotherapy, Moist heat, Taping, Vasopneumatic device, Ultrasound, Ionotophoresis 4mg /ml Dexamethasone, and Manual therapy  PLAN FOR NEXT SESSION: Review FOTO, assess 5XSTS and 6 min walking test and set goal, assess response to HEP and progress there ex as indicated  [employer] MS, PT 05/30/21 10:40 AM       Starr Lake PT, DPT, LAT, ATC  06/17/21  4:23 PM

## 2021-06-06 ENCOUNTER — Ambulatory Visit: Payer: BC Managed Care – PPO | Attending: Orthopedic Surgery

## 2021-06-06 ENCOUNTER — Telehealth: Payer: Self-pay

## 2021-06-06 DIAGNOSIS — M6281 Muscle weakness (generalized): Secondary | ICD-10-CM | POA: Insufficient documentation

## 2021-06-06 DIAGNOSIS — S83521S Sprain of posterior cruciate ligament of right knee, sequela: Secondary | ICD-10-CM | POA: Insufficient documentation

## 2021-06-06 DIAGNOSIS — S83511S Sprain of anterior cruciate ligament of right knee, sequela: Secondary | ICD-10-CM | POA: Insufficient documentation

## 2021-06-06 DIAGNOSIS — M25661 Stiffness of right knee, not elsewhere classified: Secondary | ICD-10-CM | POA: Insufficient documentation

## 2021-06-06 DIAGNOSIS — R2689 Other abnormalities of gait and mobility: Secondary | ICD-10-CM | POA: Insufficient documentation

## 2021-06-06 DIAGNOSIS — R262 Difficulty in walking, not elsewhere classified: Secondary | ICD-10-CM | POA: Insufficient documentation

## 2021-06-06 NOTE — Telephone Encounter (Signed)
Spoke to pt re: no show appt today. Advised pt re: attendance policy and of her upcoming appt on 1/17.

## 2021-06-12 NOTE — Therapy (Incomplete)
OUTPATIENT PHYSICAL THERAPY TREATMENT NOTE   Patient Name: Mary Gibbs MRN: [id number] DOB:06-Aug-1985, 36 y.o., female Today's Date: 06/12/2021  PCP: Carlena Hurl, PA-C REFERRING PROVIDER: Nicholes Stairs, *     Past Medical History:  Diagnosis Date   Anemia    @ last [facility] appt   Depression     no meds currently, was thinking of going to behavior health   H/O hiatal hernia    Hx of ulcer disease    Hx of varicella    Infection    UTI   NSVD (normal spontaneous vaginal delivery) 08/26/2012   Past Surgical History:  Procedure Laterality Date   CHOLECYSTECTOMY N/A 06/11/2017   Procedure: LAPAROSCOPIC CHOLECYSTECTOMY WITH INTRAOPERATIVE CHOLANGIOGRAM;  Surgeon: Donnie Mesa, MD;  Location: [place];  Service: General;  Laterality: N/A;   FOOT SURGERY Left    broken toe, ligament removed from back of leg   TONSILLECTOMY     As a baby   Patient Active Problem List   Diagnosis Date Noted   Postpartum care following vaginal delivery 01/23/2021   Indication for care in labor or delivery 01/22/2021   Dysthymia 06/12/2020   Abnormal urine odor 06/12/2020   Positive pregnancy test 06/12/2020   Vaccine counseling 04/09/2020   Encounter for screening for malignant neoplasm of breast 04/09/2020   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  PERTINENT HISTORY: surgery of the L foot 2021  PRECAUTIONS: Knee, running, jumping,  lunges, squats. Pt reports she has a soft knee brace to wear as needed.  SUBJECTIVE: My R knee is doing better. My pain is lower and my R leg feels stronger.  PAIN:  Are you having pain? Yes VAS scale: 0/10 Pain location: Right knee Pain orientation: Posterior  PAIN TYPE: aching and dull Pain description: intermittent  Aggravating factors: the cold makes the pain worse Relieving factors: I never have time to relieve my pain because I am taking care of 3 kids  OBJECTIVE:   DIAGNOSTIC FINDINGS: MRI was completed 05/01/21, not available   PATIENT SURVEYS:  FOTO 38% functional ability  COGNITION:  Overall cognitive status: Within functional limits for tasks assessed     SENSATION:  Light touch: Appears intact     MUSCLE LENGTH: Hamstrings: Right TBA deg; Left TBA deg   POSTURE:   Valgus positioning both knees  PALPATION: TTP posterior r knee  LE AROM/PROM:  A/PROM Right 05/22/2021 Left 05/22/2021 Right 05-28-21 Left 05-28-21  Knee flexion 110 125 120 128  Knee extension 0 0 0 0    LE MMT:  MMT Right 05/22/2021 Left 05/22/2021  Hip flexion 4 4+  Hip extension 4 4+  Hip abduction 4+ 5  Hip adduction 5 5  Hip internal rotation 5 5  Hip external rotation 4+ 5  Knee flexion 4+ 5  Knee extension 4 pain 5  Ankle dorsiflexion    Ankle plantarflexion    Ankle inversion    Ankle eversion     (  Blank rows = not tested)  LOWER EXTREMITY SPECIAL TESTS:  Knee special tests: Anterior drawer test: positive , Posterior drawer test: positive , and McMurray's test: negative Anterior and posterior drawer tests- grade 1 laxity  FUNCTIONAL TESTS:  5 times sit to stand: taken on 05-28-21  11.56 sec 6 minute walk test: 1181Ft  ( normal for age about 2200 ft)   GAIT: Distance walked: Within clinic Assistive device utilized: None Level of assistance: Complete Independence Comments: Pt walks with an antalgic gait pattern over the R LE  [facility] Adult PT Treatment:                                                 DATE: 06/13/21 Therapeutic Exercise: *** Manual Therapy: *** Neuromuscular re-ed: *** Therapeutic Activity: *** Modalities: *** Self Care: ***    [facility] Adult PT Treatment:                             DATE: 05/30/21  Therapeutic Exercise: Nu-step 5 mins, L6, UE/LE  R SLR c quad set  1 x 15 sec hold R prone knee flexion 1x15, 5# Seated LAQ x15, 5# Gastroc stretch against wall, x2, 30" ea Pelvic floor exercise with diaphragmatic breathing Clamshell with GTB band, 1x15,  Wall sit x2, 30 sec  Self Care: Recommended obtaining 5# ankle weights for resistance with HEP     TODAY'S TREATMENT:  [facility] Adult PT Treatment:                                          DATE: 05-28-21  Objective measure for 5 x sts  10.56 sec Therapeutic Exercise:  Quad set on R 3 x 10 R SLR c quad set  3 x10 5 sec hold R Heel slide x10 then x2 30" stretch R prone knee flexion 1 x 10, then RTB 2 x10 Gastroc stretch against wall, x2, 30" ea Pelvic floor exercise with diaphragmatic breathing Clamshell with GTB 1 x 15 Wall sit x2, 30 sec         PATIENT EDUCATION:  Added to HEP and explanation of 6 MWT and 5 x STS, FOTO Report   HOME EXERCISE PROGRAM: Access Code: [email or web address] URL: [email or web address] Date: 05/30/2021 Prepared by: Gar Ponto  Exercises Supine Heel Slide - 2 x daily - 7 x weekly - 1 sets - 3 reps - 30 hold Active Straight Leg Raise with Quad Set - 2 x daily - 7 x weekly - 2 sets - 10-15 reps - 3 hold Prone Knee Flexion - 2 x daily - 7 x weekly - 2 sets - 10-15 reps - 3 hold Clam with Resistance - 1 x daily - 7 x weekly - 3 sets - 10 reps Gastroc Stretch on Wall - 1 x daily - 7 x weekly - 1 sets - 3 reps - 20-30 hold Wall Squat with Swiss Ball - 1 x daily - 7 x weekly - 3 sets - 10 reps    ASSESSMENT:  CLINICAL IMPRESSION: Pt presents to PT walking with an improved gait pattern s and antalgic gait pattern. The PT  session time was limited with the pt arriving late. PT was completed for LE strengthening and  flexibility. Pt tolerated the session s adverse effects. Pt is progressing appropriately re: R knee pain and function.  REHAB POTENTIAL: Good  CLINICAL DECISION MAKING: Stable/uncomplicated  EVALUATION COMPLEXITY: Low   GOALS:   SHORT TERM GOALS:  STG Name Target Date Goal status  1 Pt will be Ind in an initial HEP Baseline:  06/12/21 INITIAL  2 Pt will voice understanding of measures to assit in the reduction of pain. Baseline:  06/12/21 INITIAL   LONG TERM GOALS:   LTG Name Target Date Goal status  1 Increase R knee flexion to 120d for improved R knee function Baseline:110d 07/09/21 INITIAL  2 Increased R hip flexion and ext strength to 4+/5 or greater and knee ext and flex strength to 5/5 for improved knee function. Baseline: 07/09/21 INITIAL  3 Pt will report a decrease in R knee pain to 3/10 or less with daily activities. Baseline: 3-10/10 07/09/21 INITIAL  4 Pt's functional ability FOTO score will increase to 67% Baseline: 38% 07/09/21 INITIAL  5 Assess 5xSTS and 6 min walking test and set LTG Baseline: 07/09/21 INITIAL  6 Develop higher level functional goals as indicated Baseline: 07/09/21 INITIAL  7  Baseline:     PLAN: PT FREQUENCY: 2x/week  PT DURATION: 6 weeks  PLANNED INTERVENTIONS: Therapeutic exercises, Therapeutic activity, Neuro Muscular re-education, Balance training, Gait training, Patient/Family education, Joint mobilization, Stair training, Dry Needling, Electrical stimulation, Cryotherapy, Moist heat, Taping, Vasopneumatic device, Ultrasound, Ionotophoresis 4mg /ml Dexamethasone, and Manual therapy  PLAN FOR NEXT SESSION: Review FOTO, assess 5XSTS and 6 min walking test and set goal, assess response to HEP and progress there ex as indicated  [employer] MS, PT 06/12/21 1:24 PM

## 2021-06-13 ENCOUNTER — Ambulatory Visit: Payer: BC Managed Care – PPO

## 2021-06-13 ENCOUNTER — Other Ambulatory Visit: Payer: Self-pay

## 2021-06-13 DIAGNOSIS — S83511S Sprain of anterior cruciate ligament of right knee, sequela: Secondary | ICD-10-CM | POA: Diagnosis not present

## 2021-06-13 DIAGNOSIS — M25661 Stiffness of right knee, not elsewhere classified: Secondary | ICD-10-CM | POA: Diagnosis not present

## 2021-06-13 DIAGNOSIS — S83521S Sprain of posterior cruciate ligament of right knee, sequela: Secondary | ICD-10-CM

## 2021-06-13 DIAGNOSIS — M6281 Muscle weakness (generalized): Secondary | ICD-10-CM | POA: Diagnosis not present

## 2021-06-13 DIAGNOSIS — R2689 Other abnormalities of gait and mobility: Secondary | ICD-10-CM | POA: Diagnosis not present

## 2021-06-13 DIAGNOSIS — R262 Difficulty in walking, not elsewhere classified: Secondary | ICD-10-CM

## 2021-06-13 NOTE — Therapy (Addendum)
OUTPATIENT PHYSICAL THERAPY TREATMENT NOTE   Patient Name: Mary Gibbs MRN: [id number] DOB:1985/12/24, 36 y.o., female Today's Date: 06/13/2021  PCP: Carlena Hurl, PA-C REFERRING PROVIDER: Nicholes Stairs, *   PT End of Session - 06/13/21 1037     Visit Number 4    Number of Visits 13    Date for PT Re-Evaluation 07/10/21    Authorization Type BCBS COMM PPO    Progress Note Due on Visit 10    PT Start Time 1030    PT Stop Time 1115    PT Time Calculation (min) 45 min    Activity Tolerance Patient tolerated treatment well    Behavior During Therapy WFL for tasks assessed/performed             Past Medical History:  Diagnosis Date   Anemia    @ last [facility] appt   Depression     no meds currently, was thinking of going to behavior health   H/O hiatal hernia    Hx of ulcer disease    Hx of varicella    Infection    UTI   NSVD (normal spontaneous vaginal delivery) 08/26/2012   Past Surgical History:  Procedure Laterality Date   CHOLECYSTECTOMY N/A 06/11/2017   Procedure: LAPAROSCOPIC CHOLECYSTECTOMY WITH INTRAOPERATIVE CHOLANGIOGRAM;  Surgeon: Donnie Mesa, MD;  Location: [place];  Service: General;  Laterality: N/A;   FOOT SURGERY Left    broken toe, ligament removed from back of leg   TONSILLECTOMY     As a baby   Patient Active Problem List   Diagnosis Date Noted   Postpartum care following vaginal delivery 01/23/2021   Indication for care in labor or delivery 01/22/2021   Dysthymia 06/12/2020   Abnormal urine odor 06/12/2020   Positive pregnancy test 06/12/2020   Vaccine counseling 04/09/2020   Encounter for screening for malignant neoplasm of breast 04/09/2020   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   Anxiety 04/09/2020   Hematuria 03/29/2018   Impaired fasting blood sugar 03/29/2018   Symptomatic cholelithiasis 06/11/2017   Anemia, iron deficiency 08/28/2015   Encounter for health maintenance examination in adult 08/28/2015   Pain of upper abdomen  08/28/2015   Influenza vaccination declined 08/28/2015   NSVD (normal spontaneous vaginal delivery) 08/26/2012   Perineal laceration with delivery, second degree 08/26/2012   Obesity with serious comorbidity 06/14/2012    REFERRING DIAG: Rupture of ACL, rupture of PLC  THERAPY DIAG:  (generalized) - Plan: PT plan of care cert/re-cert   Decreased ROM of right knee - Plan: PT plan of care cert/re-cert   Difficulty in walking, not elsewhere classified - Plan: PT plan of care cert/re-cert   Other abnormalities of gait and mobility - Plan: PT plan of care cert/re-cert  PERTINENT HISTORY: surgery of the L foot 2021  PRECAUTIONS: Knee, running, jumping, lunges, squats. Pt reports she has a soft knee brace to wear as needed.  SUBJECTIVE: Pt reports overall improvement in the R knee knee pain and mobility. Infrequent occurrences of sharp pain  PAIN:  Are you having pain? Yes VAS scale: 3/10 Pain location: Right knee Pain orientation: Posterior  PAIN TYPE: aching and dull Pain description: intermittent  Aggravating factors: the cold makes the pain worse Relieving factors: I never have time to relieve my pain because I am taking care of 3 kids  OBJECTIVE:   DIAGNOSTIC FINDINGS: MRI was completed 05/01/21, not available   PATIENT SURVEYS:  FOTO 38% functional ability  COGNITION:  Overall cognitive  status: Within functional limits for tasks assessed     SENSATION:  Light touch: Appears intact     MUSCLE LENGTH: Hamstrings: Right TBA deg; Left TBA deg   POSTURE:   Valgus positioning both knees  PALPATION: TTP posterior r knee  LE AROM/PROM:  A/PROM Right 05/22/2021 Left 05/22/2021 Right 05-28-21 Left 05-28-21  Knee flexion 110 125 120 128  Knee extension 0 0 0 0    LE MMT:  MMT Right 05/22/2021 Left 05/22/2021  Hip flexion 4 4+  Hip extension 4 4+  Hip abduction 4+ 5  Hip adduction 5 5  Hip internal rotation 5 5  Hip external rotation 4+ 5  Knee  flexion 4+ 5  Knee extension 4 pain 5  Ankle dorsiflexion    Ankle plantarflexion    Ankle inversion    Ankle eversion     (Blank rows = not tested)  LOWER EXTREMITY SPECIAL TESTS:  Knee special tests: Anterior drawer test: positive , Posterior drawer test: positive , and McMurray's test: negative Anterior and posterior drawer tests- grade 1 laxity  FUNCTIONAL TESTS:  5 times sit to stand: taken on 05-28-21  11.56 sec 6 minute walk test: 1181Ft  ( normal for age about 2200 ft)   GAIT: Distance walked: Within clinic Assistive device utilized: None Level of assistance: Complete Independence Comments: Pt walks with an antalgic gait pattern over the R LE  [facility] Adult PT Treatment:                                                DATE: 06/13/21 Therapeutic Exercise: Nu-step 5 mins, L6, UE/LE  R SLR c quad set  x10, 3", 5# R prone knee flexionx 2x10, 5# LAQ c GTB, 2x15, 3 sec Seated knee flexion, 2x15, 3"  Seated LAQ 2x10, 5# Gastroc stretch against wall, x10, 5"  Clamshell with GTB band, 2x15,  Wall sit x10, 5"  [facility] Adult PT Treatment:                             DATE: 05/30/21  Therapeutic Exercise: Nu-step 5 mins, L6, UE/LE  R SLR c quad set  1 x 15 sec hold R prone knee flexion 1x15, 5# Seated LAQ x15, 5# Gastroc stretch against wall, x2, 30" ea Pelvic floor exercise with diaphragmatic breathing Clamshell with GTB band, 1x15,  Wall sit x2, 30 sec  Self Care: Recommended obtaining 5# ankle weights for resistance with HEP     TODAY'S TREATMENT:  [facility] Adult PT Treatment:                                          DATE: 05-28-21  Objective measure for 5 x sts  10.56 sec Therapeutic Exercise:  Quad set on R 3 x 10 R SLR c quad set  3 x10 5 sec hold R Heel slide x10 then x2 30" stretch R prone knee flexion 1 x 10, then RTB 2 x10 Gastroc stretch against wall, x2, 30" ea Pelvic floor exercise with diaphragmatic breathing Clamshell with GTB 1 x 15 Wall sit x2, 30  sec         PATIENT EDUCATION:  Added to HEP and explanation of 6 MWT  and 5 x STS, FOTO Report   HOME EXERCISE PROGRAM: Access Code: [email or web address] URL: [email or web address] Date: 06/13/2021 Prepared by: Gar Ponto  Exercises Supine Heel Slide - 2 x daily - 7 x weekly - 1 sets - 3 reps - 30 hold Active Straight Leg Raise with Quad Set - 2 x daily - 7 x weekly - 2 sets - 10-15 reps - 3 hold Prone Knee Flexion - 2 x daily - 7 x weekly - 2 sets - 10-15 reps - 3 hold Clam with Resistance - 1 x daily - 7 x weekly - 3 sets - 10 reps Gastroc Stretch on Wall - 1 x daily - 7 x weekly - 1 sets - 3 reps - 20-30 hold Wall Squat with Swiss Ball - 1 x daily - 7 x weekly - 3 sets - 10 reps Sitting Knee Extension with Resistance - 1 x daily - 7 x weekly - 2 sets - 10-15 reps - 3 hold Seated Hamstring Curl with Anchored Resistance - 1 x daily - 7 x weekly - 2 sets - 10-15 reps - 3 hold     ASSESSMENT:  CLINICAL IMPRESSION: PT was completed for R knee strengthening. Demand for therex was increased today with pt tolerating without adverse effects. Changes in therex was added to her HEP. Pt reports continued improvement c R knee pain and function. R knee flexion ROM has improved to min less than set goal. Pt will continue to benefit from skilled PT to optimize functional mobility with less pain.  REHAB POTENTIAL: Good  CLINICAL DECISION MAKING: Stable/uncomplicated  EVALUATION COMPLEXITY: Low   GOALS:   SHORT TERM GOALS:  STG Name Target Date Goal status  1 Pt will be Ind in an initial HEP Baseline:  06/12/21 Met  2 Pt will voice understanding of measures to assit in the reduction of pain. Baseline:  06/12/21 ongoing   LONG TERM GOALS:   LTG Name Target Date Goal status  1 Increase R knee flexion to 120d for improved R knee function Baseline:110d. 06/12/21=118d. 07/09/21 INITIAL  2 Increased R hip flexion and ext strength to 4+/5 or greater and knee ext and flex strength  to 5/5 for improved knee function. Baseline: 07/09/21 INITIAL  3 Pt will report a decrease in R knee pain to 3/10 or less with daily activities. Baseline: 3-10/10 07/09/21 INITIAL  4 Pt's functional ability FOTO score will increase to 67% Baseline: 38% 07/09/21 INITIAL  5 Assess 5xSTS and 6 min walking test and set LTG. 06/13/21: decrease 5xsts to 10 sec or less. Increase 67mt to 1501f For improved functional ability Baseline:11.6sec, 1181 07/09/21 INITIAL  6 Develop higher level functional goals as indicated Baseline: 07/09/21 INITIAL  7  Baseline:     PLAN: PT FREQUENCY: 2x/week  PT DURATION: 6 weeks  PLANNED INTERVENTIONS: Therapeutic exercises, Therapeutic activity, Neuro Muscular re-education, Balance training, Gait training, Patient/Family education, Joint mobilization, Stair training, Dry Needling, Electrical stimulation, Cryotherapy, Moist heat, Taping, Vasopneumatic device, Ultrasound, Ionotophoresis 41m76ml Dexamethasone, and Manual therapy  PLAN FOR NEXT SESSION: Assess response to HEP and progress therex as indicated   [employer], PT 06/13/21 11:59 AM    [place], DPT, LAT, ATC  06/17/21  4:24 PM

## 2021-06-13 NOTE — Therapy (Signed)
OUTPATIENT PHYSICAL THERAPY TREATMENT NOTE   Patient Name: Mary Gibbs MRN: [id number] DOB:March 10, 1986, 36 y.o., female Today's Date: 06/18/2021  PCP: Carlena Hurl, PA-C REFERRING PROVIDER: Nicholes Stairs, *   PT End of Session - 06/18/21 0807     Visit Number 5    Number of Visits 13    Date for PT Re-Evaluation 07/10/21    Authorization Type BCBS COMM PPO    Progress Note Due on Visit 10    PT Start Time 0808    PT Stop Time 0847    PT Time Calculation (min) 39 min    Activity Tolerance Patient tolerated treatment well    Behavior During Therapy [facility] for tasks assessed/performed              Past Medical History:  Diagnosis Date   Anemia    @ last [facility] appt   Depression     no meds currently, was thinking of going to behavior health   H/O hiatal hernia    Hx of ulcer disease    Hx of varicella    Infection    UTI   NSVD (normal spontaneous vaginal delivery) 08/26/2012   Past Surgical History:  Procedure Laterality Date   CHOLECYSTECTOMY N/A 06/11/2017   Procedure: LAPAROSCOPIC CHOLECYSTECTOMY WITH INTRAOPERATIVE CHOLANGIOGRAM;  Surgeon: Donnie Mesa, MD;  Location: [place];  Service: General;  Laterality: N/A;   FOOT SURGERY Left    broken toe, ligament removed from back of leg   TONSILLECTOMY     As a baby   Patient Active Problem List   Diagnosis Date Noted   Postpartum care following vaginal delivery 01/23/2021   Indication for care in labor or delivery 01/22/2021   Dysthymia 06/12/2020   Abnormal urine odor 06/12/2020   Positive pregnancy test 06/12/2020   Vaccine counseling 04/09/2020   Encounter for screening for malignant neoplasm of breast 04/09/2020   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   Anxiety 04/09/2020   Hematuria 03/29/2018   Impaired fasting blood sugar 03/29/2018   Symptomatic cholelithiasis 06/11/2017   Anemia, iron deficiency 08/28/2015   Encounter for health maintenance examination in adult 08/28/2015   Pain of upper abdomen  08/28/2015   Influenza vaccination declined 08/28/2015   NSVD (normal spontaneous vaginal delivery) 08/26/2012   Perineal laceration with delivery, second degree 08/26/2012   Obesity with serious comorbidity 06/14/2012    REFERRING DIAG: Rupture of ACL, rupture of PLC  THERAPY DIAG:  Muscle weakness (generalized)  Decreased ROM of right knee  Difficulty in walking, not elsewhere classified  Other abnormalities of gait and mobility  PERTINENT HISTORY: surgery of the L foot 2021  PRECAUTIONS: Knee, running, jumping, lunges, squats. Pt reports she has a soft knee brace to wear as needed.  SUBJECTIVE:  I had family in town.  I worked out 2 x and I did 40 min on the elliptical  10 min forward and 10 min backwards the whole time.  Doing squats wit  15 pound KB.  I got up and down from the floor and I feel stiff.  I have to get up and down for the baby.  It felt good to work out.   PAIN:  Are you having pain? No pain VAS scale: 0/10 on entering clinic Pain location: Right knee Pain orientation: Posterior  PAIN TYPE: none today Pain description: intermittent  Aggravating factors: the cold makes the pain worse Relieving factors: I never have time to relieve my pain because I am taking care  of 3 kids  OBJECTIVE:   DIAGNOSTIC FINDINGS: MRI was completed 05/01/21, not available   PATIENT SURVEYS:  FOTO 38% functional ability  COGNITION:  Overall cognitive status: Within functional limits for tasks assessed     SENSATION:  Light touch: Appears intact     MUSCLE LENGTH: Hamstrings: Right TBA deg; Left TBA deg   POSTURE:   Valgus positioning both knees  PALPATION: TTP posterior r knee  LE AROM/PROM:  A/PROM Right 05/22/2021 Left 05/22/2021 Right 05-28-21 Left 05-28-21  Knee flexion 110 125 120 128  Knee extension 0 0 0 0    LE MMT:  MMT Right 05/22/2021 Left 05/22/2021  Hip flexion 4 4+  Hip extension 4 4+  Hip abduction 4+ 5  Hip adduction 5 5  Hip  internal rotation 5 5  Hip external rotation 4+ 5  Knee flexion 4+ 5  Knee extension 4 pain 5  Ankle dorsiflexion    Ankle plantarflexion    Ankle inversion    Ankle eversion     (Blank rows = not tested)  LOWER EXTREMITY SPECIAL TESTS:  Knee special tests: Anterior drawer test: positive , Posterior drawer test: positive , and McMurray's test: negative Anterior and posterior drawer tests- grade 1 laxity  FUNCTIONAL TESTS:  5 times sit to stand: taken on 05-28-21  11.56 sec 6 minute walk test: 1181Ft  ( normal for age about 2200 ft)   GAIT: Distance walked: Within clinic Assistive device utilized: None Level of assistance: Complete Independence Comments: Pt walks with an antalgic gait pattern over the R LE   [facility] Adult PT Treatment:                                                DATE: 06-18-21 Therapeutic Exercise: Recumbent bike 5 mins, L6,  Wall sit x10, 10" feeling slight burn at the end Gastroc stretch against wall, 2 x for 20-30 sec each Pt with question of how to get down on ground to play with baby.  Introduced Reverse lunge with limit to 90 degree knee flexion with  bil UE support  with L LE and one UE with R LE bearing wt  1 x 10 each R SLR c quad set  x10, 3", 6# R prone knee flexionx 2x10, 6# Seated LAQ 2x10, 6# Clamshell with GTB band, 2x15,  15 #KB box squats to mat  2 x 10 Step up on 6 inch step 2 x 10 Added to HEP Access Code: [email or web address] Side Stepping with Resistance at Feet - 1 x daily - 7 x weekly - 3 sets - 10 reps Step Up - 1 x daily - 7 x weekly - 3 sets - 10 reps    [facility] Adult PT Treatment:                                                DATE: 06/13/21 Therapeutic Exercise: Nu-step 5 mins, L6, UE/LE  R SLR c quad set   2 x10, 3" hold, 5# R prone knee flexionx 2x10, 5# LAQ c GTB, 2x15, 3 sec Seated knee flexion, 2x15, 3"  Seated LAQ 2x10, 5# Gastroc stretch against wall, x10, 5"  Clamshell with GTB band, 2x15,  Wall sit x10,  5"  [place] Adult PT  Treatment:                             DATE: 05/30/21  Therapeutic Exercise: Nu-step 5 mins, L6, UE/LE  R SLR c quad set  1 x 15 sec hold R prone knee flexion 1x15, 5# Seated LAQ x15, 5# Gastroc stretch against wall, x2, 30" ea Pelvic floor exercise with diaphragmatic breathing Clamshell with GTB band, 1x15,  Wall sit x2, 30 sec  Self Care: Recommended obtaining 5# ankle weights for resistance with HEP     TODAY'S TREATMENT:  [facility] Adult PT Treatment:                                          DATE: 05-28-21  Objective measure for 5 x sts  10.56 sec Therapeutic Exercise:  Quad set on R 3 x 10 R SLR c quad set  3 x10 5 sec hold R Heel slide x10 then x2 30" stretch R prone knee flexion 1 x 10, then RTB 2 x10 Gastroc stretch against wall, x2, 30" ea Pelvic floor exercise with diaphragmatic breathing Clamshell with GTB 1 x 15 Wall sit x2, 30 sec         PATIENT EDUCATION:  Added to HEP  step ups and side stepping resistance  and educated and showed how to get down on the floor with reverse lunge maintaining 90 degree knee flexion to play with baby on floor   HOME EXERCISE PROGRAM: Access Code: [email or web address] URL: [email or web address] Date: 06/13/2021 Prepared by: Gar Ponto  Exercises Supine Heel Slide - 2 x daily - 7 x weekly - 1 sets - 3 reps - 30 hold Active Straight Leg Raise with Quad Set - 2 x daily - 7 x weekly - 2 sets - 10-15 reps - 3 hold Prone Knee Flexion - 2 x daily - 7 x weekly - 2 sets - 10-15 reps - 3 hold Clam with Resistance - 1 x daily - 7 x weekly - 3 sets - 10 reps Gastroc Stretch on Wall - 1 x daily - 7 x weekly - 1 sets - 3 reps - 20-30 hold Wall Squat with Swiss Ball - 1 x daily - 7 x weekly - 3 sets - 10 reps Sitting Knee Extension with Resistance - 1 x daily - 7 x weekly - 2 sets - 10-15 reps - 3 hold Seated Hamstring Curl with Anchored Resistance - 1 x daily - 7 x weekly - 2 sets - 10-15 reps - 3 hold Side Stepping with  Resistance at Feet - 1 x daily - 7 x weekly - 3 sets - 10 reps Step Up - 1 x daily - 7 x weekly - 3 sets - 10 reps    ASSESSMENT:  CLINICAL IMPRESSION: Pt enters clinic without pain and had no pain at end of session.  Pt completed exercises with no adverse effects. PT was completed for R knee strengthening. Demand for therex was increased today demonstrating reverse lunge to help pt get to ground comfortably as requested and added to HEP. Changes in therex was added to her HEP. No goals achieved today but pt without pain throughout session  REHAB POTENTIAL: Good  CLINICAL DECISION MAKING: Stable/uncomplicated  EVALUATION COMPLEXITY: Low   GOALS:   SHORT TERM GOALS:  STG Name Target Date  Goal status  1 Pt will be Ind in an initial HEP Baseline:  06/12/21 Met  2 Pt will voice understanding of measures to assit in the reduction of pain. Baseline:  06/12/21 ongoing   LONG TERM GOALS:   LTG Name Target Date Goal status  1 Increase R knee flexion to 120d for improved R knee function Baseline:110d. 06/12/21=118d. 07/09/21 INITIAL  2 Increased R hip flexion and ext strength to 4+/5 or greater and knee ext and flex strength to 5/5 for improved knee function. Baseline: 07/09/21 INITIAL  3 Pt will report a decrease in R knee pain to 3/10 or less with daily activities. Baseline: 3-10/10 07/09/21 INITIAL  4 Pt's functional ability FOTO score will increase to 67% Baseline: 38% 07/09/21 INITIAL  5 Assess 5xSTS and 6 min walking test and set LTG. 06/13/21: decrease 5xsts to 10 sec or less. Increase 37mt to 15077f For improved functional ability Baseline:11.6sec, 1181 07/09/21 INITIAL  6 Develop higher level functional goals as indicated Baseline: 07/09/21 INITIAL  7  Baseline:     PLAN: PT FREQUENCY: 2x/week  PT DURATION: 6 weeks  PLANNED INTERVENTIONS: Therapeutic exercises, Therapeutic activity, Neuro Muscular re-education, Balance training, Gait training, Patient/Family education, Joint  mobilization, Stair training, Dry Needling, Electrical stimulation, Cryotherapy, Moist heat, Taping, Vasopneumatic device, Ultrasound, Ionotophoresis 4m81ml Dexamethasone, and Manual therapy  PLAN FOR NEXT SESSION: Assess response to HEP and progress therex as indicated   LawVoncille LoT, ATRSan Antonio Gastroenterology Endoscopy Center Northrtified Exercise Expert for the Aging Adult  06/18/21 1:13 PM Phone: [phone]: [phone]

## 2021-06-18 ENCOUNTER — Ambulatory Visit: Payer: BC Managed Care – PPO | Admitting: Physical Therapy

## 2021-06-18 ENCOUNTER — Other Ambulatory Visit: Payer: Self-pay

## 2021-06-18 ENCOUNTER — Encounter: Payer: Self-pay | Admitting: Physical Therapy

## 2021-06-18 DIAGNOSIS — R262 Difficulty in walking, not elsewhere classified: Secondary | ICD-10-CM

## 2021-06-18 DIAGNOSIS — R2689 Other abnormalities of gait and mobility: Secondary | ICD-10-CM

## 2021-06-18 DIAGNOSIS — M25661 Stiffness of right knee, not elsewhere classified: Secondary | ICD-10-CM

## 2021-06-18 DIAGNOSIS — M6281 Muscle weakness (generalized): Secondary | ICD-10-CM

## 2021-06-18 DIAGNOSIS — S83511S Sprain of anterior cruciate ligament of right knee, sequela: Secondary | ICD-10-CM | POA: Diagnosis not present

## 2021-06-18 DIAGNOSIS — S83521S Sprain of posterior cruciate ligament of right knee, sequela: Secondary | ICD-10-CM | POA: Diagnosis not present

## 2021-06-20 ENCOUNTER — Ambulatory Visit: Payer: BC Managed Care – PPO

## 2021-06-25 ENCOUNTER — Ambulatory Visit: Payer: BC Managed Care – PPO

## 2021-06-25 ENCOUNTER — Other Ambulatory Visit: Payer: Self-pay

## 2021-06-25 DIAGNOSIS — M25661 Stiffness of right knee, not elsewhere classified: Secondary | ICD-10-CM

## 2021-06-25 DIAGNOSIS — M6281 Muscle weakness (generalized): Secondary | ICD-10-CM

## 2021-06-25 DIAGNOSIS — S83511S Sprain of anterior cruciate ligament of right knee, sequela: Secondary | ICD-10-CM | POA: Diagnosis not present

## 2021-06-25 DIAGNOSIS — R262 Difficulty in walking, not elsewhere classified: Secondary | ICD-10-CM | POA: Diagnosis not present

## 2021-06-25 DIAGNOSIS — R2689 Other abnormalities of gait and mobility: Secondary | ICD-10-CM

## 2021-06-25 DIAGNOSIS — S83521S Sprain of posterior cruciate ligament of right knee, sequela: Secondary | ICD-10-CM | POA: Diagnosis not present

## 2021-06-25 NOTE — Therapy (Signed)
OUTPATIENT PHYSICAL THERAPY TREATMENT NOTE   Patient Name: Mary Gibbs MRN: [id number] DOB:1986-04-10, 36 y.o., female Today's Date: 06/25/2021  PCP: Carlena Hurl, PA-C REFERRING PROVIDER: Carlena Hurl, PA-C   PT End of Session - 06/25/21 1024     Visit Number 6    Number of Visits 13    Date for PT Re-Evaluation 07/10/21    Authorization Type BCBS COMM PPO    Progress Note Due on Visit 10    PT Start Time 1023    PT Stop Time 1103    PT Time Calculation (min) 40 min    Activity Tolerance Patient tolerated treatment well    Behavior During Therapy WFL for tasks assessed/performed               Past Medical History:  Diagnosis Date   Anemia    @ last [facility] appt   Depression     no meds currently, was thinking of going to behavior health   H/O hiatal hernia    Hx of ulcer disease    Hx of varicella    Infection    UTI   NSVD (normal spontaneous vaginal delivery) 08/26/2012   Past Surgical History:  Procedure Laterality Date   CHOLECYSTECTOMY N/A 06/11/2017   Procedure: LAPAROSCOPIC CHOLECYSTECTOMY WITH INTRAOPERATIVE CHOLANGIOGRAM;  Surgeon: Donnie Mesa, MD;  Location: [place];  Service: General;  Laterality: N/A;   FOOT SURGERY Left    broken toe, ligament removed from back of leg   TONSILLECTOMY     As a baby   Patient Active Problem List   Diagnosis Date Noted   Postpartum care following vaginal delivery 01/23/2021   Indication for care in labor or delivery 01/22/2021   Dysthymia 06/12/2020   Abnormal urine odor 06/12/2020   Positive pregnancy test 06/12/2020   Vaccine counseling 04/09/2020   Encounter for screening for malignant neoplasm of breast 04/09/2020   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   Anxiety 04/09/2020   Hematuria 03/29/2018   Impaired fasting blood sugar 03/29/2018   Symptomatic cholelithiasis 06/11/2017   Anemia, iron deficiency 08/28/2015   Encounter for health maintenance examination in adult 08/28/2015   Pain of upper abdomen  08/28/2015   Influenza vaccination declined 08/28/2015   NSVD (normal spontaneous vaginal delivery) 08/26/2012   Perineal laceration with delivery, second degree 08/26/2012   Obesity with serious comorbidity 06/14/2012    REFERRING DIAG: Rupture of ACL, rupture of PLC  THERAPY DIAG:  Muscle weakness (generalized)  Decreased ROM of right knee  Difficulty in walking, not elsewhere classified  Other abnormalities of gait and mobility  PERTINENT HISTORY: surgery of the L foot 2021  PRECAUTIONS: Knee, running, jumping, lunges, squats. Pt reports she has a soft knee brace to wear as needed.  SUBJECTIVE:  Pt reports the pain is much better with occasional sharp pain.  PAIN:  Are you having pain? No pain VAS scale: 0/10 on entering clinic, 4/10 occasional sharp pain Pain location: Right knee Pain orientation: Posterior  PAIN TYPE: none today Pain description: Occasional Aggravating factors: the cold makes the pain worse Relieving factors: I never have time to relieve my pain because I am taking care of 3 kids  OBJECTIVE:   DIAGNOSTIC FINDINGS: MRI was completed 05/01/21, not available   PATIENT SURVEYS:  FOTO 38% functional ability; 06/25/21- 66%  COGNITION:  Overall cognitive status: Within functional limits for tasks assessed     SENSATION:  Light touch: Appears intact     MUSCLE LENGTH: Hamstrings:  Right TBA deg; Left TBA deg   POSTURE:   Valgus positioning both knees  PALPATION: TTP posterior r knee  LE AROM/PROM:  A/PROM Right 05/22/2021 Left 05/22/2021 Right 05-28-21 Left 05-28-21  Knee flexion 110 125 120 128  Knee extension 0 0 0 0    LE MMT:  MMT Right 05/22/2021 Left 05/22/2021  Hip flexion 4 4+  Hip extension 4 4+  Hip abduction 4+ 5  Hip adduction 5 5  Hip internal rotation 5 5  Hip external rotation 4+ 5  Knee flexion 4+ 5  Knee extension 4 pain 5  Ankle dorsiflexion    Ankle plantarflexion    Ankle inversion    Ankle eversion      (Blank rows = not tested)  LOWER EXTREMITY SPECIAL TESTS:  Knee special tests: Anterior drawer test: positive , Posterior drawer test: positive , and McMurray's test: negative Anterior and posterior drawer tests- grade 1 laxity  FUNCTIONAL TESTS:  5 times sit to stand: taken on 05-28-21  11.56 sec 6 minute walk test: 1181Ft  ( normal for age about 2200 ft)   GAIT: Distance walked: Within clinic Assistive device utilized: None Level of assistance: Complete Independence Comments: Pt walks with an antalgic gait pattern over the R LE   [facility] Adult PT Treatment:                                                DATE: 06/25/21 Therapeutic Exercise: Elliptical 5 mins, L1, R1  Wall slides 2x10, 3" Gastroc stretch against wall, 2 x for 20-30 sec each Lat step ups, 2x10, 4", no hand A Split squats to bosu ball, 2x10, L and R, min hand A Omega knee flexion, 3x10, 20# Self Care: Reviewed FOTO with pt progressing from 38% to 66%    [facility] Adult PT Treatment:                                                DATE: 06-18-21 Therapeutic Exercise: Recumbent bike 5 mins, L6,  Wall sit x10, 10" feeling slight burn at the end Gastroc stretch against wall, 2 x for 20-30 sec each Pt with question of how to get down on ground to play with baby.  Introduced Reverse lunge with limit to 90 degree knee flexion with  bil UE support  with L LE and one UE with R LE bearing wt  1 x 10 each R SLR c quad set  x10, 3", 6# R prone knee flexionx 2x10, 6# Seated LAQ 2x10, 6# Clamshell with GTB band, 2x15,  15 #KB box squats to mat  2 x 10 Step up on 6 inch step 2 x 10  Added to HEP Access Code: [email or web address] Side Stepping with Resistance at Feet - 1 x daily - 7 x weekly - 3 sets - 10 reps Step Up - 1 x daily - 7 x weekly - 3 sets - 10 reps    [facility] Adult PT Treatment:  DATE: 06/13/21 Therapeutic Exercise: Nu-step 5 mins, L6, UE/LE  R SLR c quad set   2 x10, 3" hold,  5# R prone knee flexionx 2x10, 5# LAQ c GTB, 2x15, 3 sec Seated knee flexion, 2x15, 3"  Seated LAQ 2x10, 5# Gastroc stretch against wall, x10, 5"  Clamshell with GTB band, 2x15,  Wall sit x10, 5"  [facility] Adult PT Treatment:                             DATE: 05/30/21  Therapeutic Exercise: Nu-step 5 mins, L6, UE/LE  R SLR c quad set  1 x 15 sec hold R prone knee flexion 1x15, 5# Seated LAQ x15, 5# Gastroc stretch against wall, x2, 30" ea Pelvic floor exercise with diaphragmatic breathing Clamshell with GTB band, 1x15,  Wall sit x2, 30 sec  Self Care: Recommended obtaining 5# ankle weights for resistance with HEP   PATIENT EDUCATION:  Added to HEP  step ups and side stepping resistance  and educated and showed how to get down on the floor with reverse lunge maintaining 90 degree knee flexion to play with baby on floor   HOME EXERCISE PROGRAM: Access Code: [email or web address] URL: [email or web address] Date: 06/13/2021 Prepared by: Gar Ponto  Exercises Supine Heel Slide - 2 x daily - 7 x weekly - 1 sets - 3 reps - 30 hold Active Straight Leg Raise with Quad Set - 2 x daily - 7 x weekly - 2 sets - 10-15 reps - 3 hold Prone Knee Flexion - 2 x daily - 7 x weekly - 2 sets - 10-15 reps - 3 hold Clam with Resistance - 1 x daily - 7 x weekly - 3 sets - 10 reps Gastroc Stretch on Wall - 1 x daily - 7 x weekly - 1 sets - 3 reps - 20-30 hold Wall Squat with Swiss Ball - 1 x daily - 7 x weekly - 3 sets - 10 reps Sitting Knee Extension with Resistance - 1 x daily - 7 x weekly - 2 sets - 10-15 reps - 3 hold Seated Hamstring Curl with Anchored Resistance - 1 x daily - 7 x weekly - 2 sets - 10-15 reps - 3 hold Side Stepping with Resistance at Feet - 1 x daily - 7 x weekly - 3 sets - 10 reps Step Up - 1 x daily - 7 x weekly - 3 sets - 10 reps    ASSESSMENT:  CLINICAL IMPRESSION: FOTO was reassessed with pt's preceived level of function much improved. Strengthening of the R LE was  continued with progression of demand. Therex were completed in both the open and closed kinetic chain. Pt tolerated both wall squats and split squats with greater knee flexion. Pt is making appropriate progress re: strength and function of the R LE.   REHAB POTENTIAL: Good  CLINICAL DECISION MAKING: Stable/uncomplicated  EVALUATION COMPLEXITY: Low   GOALS:   SHORT TERM GOALS:  STG Name Target Date Goal status  1 Pt will be Ind in an initial HEP Baseline:  06/12/21 Met  2 Pt will voice understanding of measures to assit in the reduction of pain. Baseline:  06/12/21 ongoing   LONG TERM GOALS:   LTG Name Target Date Goal status  1 Increase R knee flexion to 120d for improved R knee function Baseline:110d. 06/12/21=118d. 07/09/21 INITIAL  2 Increased R hip flexion and ext strength to 4+/5 or greater and knee ext and flex  strength to 5/5 for improved knee function. Baseline: 07/09/21 INITIAL  3 Pt will report a decrease in R knee pain to 3/10 or less with daily activities. Baseline: 3-10/10 07/09/21 INITIAL  4 Pt's functional ability FOTO score will increase to 67% Baseline: 38% 07/09/21 INITIAL  5 Assess 5xSTS and 6 min walking test and set LTG. 06/13/21: decrease 5xsts to 10 sec or less. Increase 173mt to [id number] For improved functional ability Baseline:11.6sec, 1181 07/09/21 INITIAL  6 Develop higher level functional goals as indicated Baseline: 07/09/21 INITIAL  7  Baseline:     PLAN: PT FREQUENCY: 2x/week  PT DURATION: 6 weeks  PLANNED INTERVENTIONS: Therapeutic exercises, Therapeutic activity, Neuro Muscular re-education, Balance training, Gait training, Patient/Family education, Joint mobilization, Stair training, Dry Needling, Electrical stimulation, Cryotherapy, Moist heat, Taping, Vasopneumatic device, Ultrasound, Ionotophoresis 73m61ml Dexamethasone, and Manual therapy  PLAN FOR NEXT SESSION: Assess response to HEP and progress therex as indicated. Review HEP.   Caylen Yardley MS,  PT 06/25/21 1:38 PM

## 2021-06-27 ENCOUNTER — Other Ambulatory Visit: Payer: Self-pay

## 2021-06-27 ENCOUNTER — Ambulatory Visit: Payer: BC Managed Care – PPO

## 2021-06-27 DIAGNOSIS — R262 Difficulty in walking, not elsewhere classified: Secondary | ICD-10-CM

## 2021-06-27 DIAGNOSIS — M25661 Stiffness of right knee, not elsewhere classified: Secondary | ICD-10-CM

## 2021-06-27 DIAGNOSIS — M6281 Muscle weakness (generalized): Secondary | ICD-10-CM | POA: Diagnosis not present

## 2021-06-27 DIAGNOSIS — S83521S Sprain of posterior cruciate ligament of right knee, sequela: Secondary | ICD-10-CM | POA: Diagnosis not present

## 2021-06-27 DIAGNOSIS — F411 Generalized anxiety disorder: Secondary | ICD-10-CM | POA: Diagnosis not present

## 2021-06-27 DIAGNOSIS — R2689 Other abnormalities of gait and mobility: Secondary | ICD-10-CM

## 2021-06-27 DIAGNOSIS — S83511S Sprain of anterior cruciate ligament of right knee, sequela: Secondary | ICD-10-CM | POA: Diagnosis not present

## 2021-06-27 NOTE — Therapy (Signed)
OUTPATIENT PHYSICAL THERAPY TREATMENT NOTE   Patient Name: Mary Gibbs MRN: [id number] DOB:May 20, 1986, 36 y.o., female Today's Date: 06/27/2021  PCP: Carlena Hurl, PA-C REFERRING PROVIDER: Nicholes Stairs, *   PT End of Session - 06/27/21 1103     Visit Number 7    Number of Visits 13    Date for PT Re-Evaluation 07/10/21    Authorization Type BCBS COMM PPO    Progress Note Due on Visit 10    PT Start Time 1017    PT Stop Time 1100    PT Time Calculation (min) 43 min    Activity Tolerance Patient tolerated treatment well    Behavior During Therapy WFL for tasks assessed/performed                Past Medical History:  Diagnosis Date   Anemia    @ last [facility]IC appt   Depression     no meds currently, was thinking of going to behavior health   H/O hiatal hernia    Hx of ulcer disease    Hx of varicella    Infection    UTI   NSVD (normal spontaneous vaginal delivery) 08/26/2012   Past Surgical History:  Procedure Laterality Date   CHOLECYSTECTOMY N/A 06/11/2017   Procedure: LAPAROSCOPIC CHOLECYSTECTOMY WITH INTRAOPERATIVE CHOLANGIOGRAM;  Surgeon: Donnie Mesa, MD;  Location: [place];  Service: General;  Laterality: N/A;   FOOT SURGERY Left    broken toe, ligament removed from back of leg   TONSILLECTOMY     As a baby   Patient Active Problem List   Diagnosis Date Noted   Postpartum care following vaginal delivery 01/23/2021   Indication for care in labor or delivery 01/22/2021   Dysthymia 06/12/2020   Abnormal urine odor 06/12/2020   Positive pregnancy test 06/12/2020   Vaccine counseling 04/09/2020   Encounter for screening for malignant neoplasm of breast 04/09/2020   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   Anxiety 04/09/2020   Hematuria 03/29/2018   Impaired fasting blood sugar 03/29/2018   Symptomatic cholelithiasis 06/11/2017   Anemia, iron deficiency 08/28/2015   Encounter for health maintenance examination in adult 08/28/2015   Pain of upper abdomen  08/28/2015   Influenza vaccination declined 08/28/2015   NSVD (normal spontaneous vaginal delivery) 08/26/2012   Perineal laceration with delivery, second degree 08/26/2012   Obesity with serious comorbidity 06/14/2012    REFERRING DIAG: Rupture of ACL, rupture of PLC  THERAPY DIAG:  Muscle weakness (generalized)  Decreased ROM of right knee  Difficulty in walking, not elsewhere classified  Other abnormalities of gait and mobility  PERTINENT HISTORY: surgery of the L foot 2021  PRECAUTIONS: Knee, running, jumping, lunges, squats. Pt reports she has a soft knee brace to wear as needed.  SUBJECTIVE:  Since last session, pt reports only experiencing R knee pain with sit to stand after prolonged sitting on a couch.  PAIN:  Are you having pain? No pain VAS scale: 0/10 on entering clinic, 4/10 occasional sharp pain Pain location: Right knee Pain orientation: Posterior  PAIN TYPE: none today Pain description: Occasional Aggravating factors: the cold makes the pain worse Relieving factors: I never have time to relieve my pain because I am taking care of 3 kids  OBJECTIVE:   DIAGNOSTIC FINDINGS: MRI was completed 05/01/21, not available   PATIENT SURVEYS:  FOTO 38% functional ability; 06/25/21- 66%  COGNITION:  Overall cognitive status: Within functional limits for tasks assessed     SENSATION:  Light  touch: Appears intact     MUSCLE LENGTH: Hamstrings: Right TBA deg; Left TBA deg   POSTURE:   Valgus positioning both knees  PALPATION: TTP posterior r knee  LE AROM/PROM:  A/PROM Right 05/22/2021 Left 05/22/2021 Right 05-28-21 Left 05-28-21  Knee flexion 110 125 120 128  Knee extension 0 0 0 0    LE MMT:  MMT Right 05/22/2021 Left 05/22/2021  Hip flexion 4 4+  Hip extension 4 4+  Hip abduction 4+ 5  Hip adduction 5 5  Hip internal rotation 5 5  Hip external rotation 4+ 5  Knee flexion 4+ 5  Knee extension 4 pain 5  Ankle dorsiflexion    Ankle  plantarflexion    Ankle inversion    Ankle eversion     (Blank rows = not tested)  LOWER EXTREMITY SPECIAL TESTS:  Knee special tests: Anterior drawer test: positive , Posterior drawer test: positive , and McMurray's test: negative Anterior and posterior drawer tests- grade 1 laxity  FUNCTIONAL TESTS:  5 times sit to stand: taken on 05-28-21  11.56 sec 6 minute walk test: 1181Ft  ( normal for age about 2200 ft)   GAIT: Distance walked: Within clinic Assistive device utilized: None Level of assistance: Complete Independence Comments: Pt walks with an antalgic gait pattern over the R LE   [facility] Adult PT Treatment:                                                DATE: 06/27/21  Therapeutic Exercise: Elliptical 5 mins, L1, R1  Wall slides 1x10, 5" Gastroc stretch against wall, 2 x for 20 sec each Lat step ups, 2x10, 8", no hand A SL RDL to 14" cone height, 2x10 Supine hamstring theraball rolls x15 Supine hamstring/glute bridges 2x10, 3" Banded side steps, BTB, 4x69f  Self Care: Banded side steps added to pt's HEP   [facility]                                                [facility]: [facility] Therapeutic Exercise: Elliptical 5 mins, L1, R1  Wall slides 2x10, 3" Gastroc stretch against wall, 2 x for 20-30 sec each Lat step ups, 2x10, 4", no hand A Split squats to bosu ball, 2x10, L and R, min hand A Omega knee flexion, 3x10, 20#  Self Care: Reviewed FOTO with pt progressing from 38% to 66%    [facility]                                                DATE: 06[facility]21 Therapeutic Exercise: Recumbent bike 5 mins, L6,  Wall sit x10, 10" feeling slight burn at the end Gastroc stretch against wall, 2 x for 20-30 sec each Pt with question of how to get down on ground to play with baby.  Introduced Reverse lunge with limit to 90 degree knee flexion with  bil UE support  with L LE and one UE with R LE bearing wt  1 x 10 each R SLR c quad set  x10, 3", 6# R  prone knee flexionx 2x10, 6# Seated LAQ 2x10, 6# Clamshell with GTB band, 2x15,  15 #KB box squats to mat  2 x 10 Step up on 6 inch step 2 x 10  Added to HEP Access Code: [email or web address] Side Stepping with Resistance at Feet - 1 x daily - 7 x weekly - 3 sets - 10 reps Step Up - 1 x daily - 7 x weekly - 3 sets - 10 reps       [facility] Adult PT Treatment:                                                DATE: 06/13/21 Therapeutic Exercise: Nu-step 5 mins, L6, UE/LE  R SLR c quad set   2 x10, 3" hold, 5# R prone knee flexionx 2x10, 5# LAQ c GTB, 2x15, 3 sec Seated knee flexion, 2x15, 3"  Seated LAQ 2x10, 5# Gastroc stretch against wall, x10, 5"  Clamshell with GTB band, 2x15,  Wall sit x10, 5"   HOME EXERCISE PROGRAM: Access Code: [email or web address] URL: [email or web address] Date: 06/13/2021 Prepared by: Gar Ponto  Exercises Supine Heel Slide - 2 x daily - 7 x weekly - 1 sets - 3 reps - 30 hold Active Straight Leg Raise with Quad Set - 2 x daily - 7 x weekly - 2 sets - 10-15 reps - 3 hold Prone Knee Flexion - 2 x daily - 7 x weekly - 2 sets - 10-15 reps - 3 hold Clam with Resistance - 1 x daily - 7 x weekly - 3 sets - 10 reps Gastroc Stretch on Wall - 1 x daily - 7 x weekly - 1 sets - 3 reps - 20-30 hold Wall Squat with Swiss Ball - 1 x daily - 7 x weekly - 3 sets - 10 reps Sitting Knee Extension with Resistance - 1 x daily - 7 x weekly - 2 sets - 10-15 reps - 3 hold Seated Hamstring Curl with Anchored Resistance - 1 x daily - 7 x weekly - 2 sets - 10-15 reps - 3 hold Side Stepping with Resistance at Feet - 1 x daily - 7 x weekly - 3 sets - 10 reps Step Up - 1 x daily - 7 x weekly - 3 sets - 10 reps    ASSESSMENT:  CLINICAL IMPRESSION: PT was completed for R LE strengthening with emphasis on the quads and hamstrings. CKC exs were progressed to SL exs. Pt demonstrated good knee control and hip stability with SL RDLs. Ligamentous assessment with ant and post drawer tests was  found to have grade 1 laxity, which was the case on the eval. Pt is making appropriate progress re: strength, stability, function and pain. Pt will benefit from continued skilled PT to optimize functional use of the R LE with minimized pain.  REHAB POTENTIAL: Good  CLINICAL DECISION MAKING: Stable/uncomplicated  EVALUATION COMPLEXITY: Low   GOALS:   SHORT TERM GOALS:  STG Name Target Date Goal status  1 Pt will be Ind in an initial HEP Baseline:  06/12/21 Met  2 Pt will voice understanding of measures to assit in the reduction of pain. Ptuse cold packs as needed Baseline:  06/12/21 Met 06/27/21   LONG TERM GOALS:   LTG Name Target Date Goal status  1 Increase R knee flexion to 120d for improved R knee function Baseline:110d.  06/12/21=118d. 07/09/21 INITIAL  2 Increased R hip flexion and ext strength to 4+/5 or greater and knee ext and flex strength to 5/5 for improved knee function. Baseline: 07/09/21 INITIAL  3 Pt will report a decrease in R knee pain to 3/10 or less with daily activities. Baseline: 3-10/10 07/09/21 INITIAL  4 Pt's functional ability FOTO score will increase to 67% Baseline: 38% 07/09/21 INITIAL  5 Assess 5xSTS and 6 min walking test and set LTG. 06/13/21: decrease 5xsts to 10 sec or less. Increase 71mt to [id number] For improved functional ability Baseline:11.6sec, 1181 07/09/21 INITIAL  6 Develop higher level functional goals as indicated Baseline: 07/09/21 INITIAL  7  Baseline:     PLAN: PT FREQUENCY: 2x/week  PT DURATION: 6 weeks  PLANNED INTERVENTIONS: Therapeutic exercises, Therapeutic activity, Neuro Muscular re-education, Balance training, Gait training, Patient/Family education, Joint mobilization, Stair training, Dry Needling, Electrical stimulation, Cryotherapy, Moist heat, Taping, Vasopneumatic device, Ultrasound, Ionotophoresis 55m40ml Dexamethasone, and Manual therapy  PLAN FOR NEXT SESSION: Assess response to HEP and progress therex as indicated. Review HEP.  Assess 6 MWT and 5xSTS   AllGar Ponto, PT 06/27/21 12:51 PM

## 2021-07-01 ENCOUNTER — Encounter: Payer: BC Managed Care – PPO | Admitting: Physical Therapy

## 2021-07-02 ENCOUNTER — Ambulatory Visit: Payer: BC Managed Care – PPO

## 2021-07-03 NOTE — Therapy (Signed)
OUTPATIENT PHYSICAL THERAPY TREATMENT NOTE   Patient Name: Mary Gibbs MRN: [id number] DOB:14-Oct-1985, 36 y.o., female Today's Date: 07/04/2021  PCP: Carlena Hurl, PA-C REFERRING PROVIDER: Nicholes Stairs, *   PT End of Session - 07/04/21 1033     Visit Number 8    Number of Visits 13    Date for PT Re-Evaluation 07/10/21    Authorization Type BCBS COMM PPO    Progress Note Due on Visit 10    PT Start Time 1027    PT Stop Time 1100    PT Time Calculation (min) 33 min    Activity Tolerance Patient tolerated treatment well    Behavior During Therapy WFL for tasks assessed/performed                 Past Medical History:  Diagnosis Date   Anemia    @ last [place] appt   Depression     no meds currently, was thinking of going to behavior health   H/O hiatal hernia    Hx of ulcer disease    Hx of varicella    Infection    UTI   NSVD (normal spontaneous vaginal delivery) 08/26/2012   Past Surgical History:  Procedure Laterality Date   CHOLECYSTECTOMY N/A 06/11/2017   Procedure: LAPAROSCOPIC CHOLECYSTECTOMY WITH INTRAOPERATIVE CHOLANGIOGRAM;  Surgeon: Donnie Mesa, MD;  Location: [place];  Service: General;  Laterality: N/A;   FOOT SURGERY Left    broken toe, ligament removed from back of leg   TONSILLECTOMY     As a baby   Patient Active Problem List   Diagnosis Date Noted   Postpartum care following vaginal delivery 01/23/2021   Indication for care in labor or delivery 01/22/2021   Dysthymia 06/12/2020   Abnormal urine odor 06/12/2020   Positive pregnancy test 06/12/2020   Vaccine counseling 04/09/2020   Encounter for screening for malignant neoplasm of breast 04/09/2020   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   Anxiety 04/09/2020   Hematuria 03/29/2018   Impaired fasting blood sugar 03/29/2018   Symptomatic cholelithiasis 06/11/2017   Anemia, iron deficiency 08/28/2015   Encounter for health maintenance examination in adult 08/28/2015   Pain of upper  abdomen 08/28/2015   Influenza vaccination declined 08/28/2015   NSVD (normal spontaneous vaginal delivery) 08/26/2012   Perineal laceration with delivery, second degree 08/26/2012   Obesity with serious comorbidity 06/14/2012    REFERRING DIAG: Rupture of ACL, rupture of PLC  THERAPY DIAG:  Muscle weakness (generalized)  Decreased ROM of right knee  Difficulty in walking, not elsewhere classified  Other abnormalities of gait and mobility  PERTINENT HISTORY: surgery of the L foot 2021  PRECAUTIONS: Knee, running, jumping, lunges, squats. Pt reports she has a soft knee brace to wear as needed.  SUBJECTIVE: Pt reports her her has been more sore c rainy and cold weather and also completing aerobic and a spin class. She also reports soreness of her R foot. Steps are better, but descending is more difficult.  PAIN:  Are you having pain? No pain VAS scale: 4/10 on entering clinic Pain location: Right knee Pain orientation: Posterior  PAIN TYPE: none today Pain description: Occasional Aggravating factors: the cold makes the pain worse Relieving factors: I never have time to relieve my pain because I am taking care of 3 kids  OBJECTIVE:   DIAGNOSTIC FINDINGS: MRI was completed 05/01/21, not available   PATIENT SURVEYS:  FOTO 38% functional ability; 06/25/21- 66%  COGNITION:  Overall cognitive status:  Within functional limits for tasks assessed     SENSATION:  Light touch: Appears intact     MUSCLE LENGTH: Hamstrings: Right TBA deg; Left TBA deg   POSTURE:   Valgus positioning both knees  PALPATION: TTP posterior r knee  LE AROM/PROM:  A/PROM Right 05/22/2021 Left 05/22/2021 Right 05-28-21 Left 05-28-21  Knee flexion 110 125 120 128  Knee extension 0 0 0 0    LE MMT:  MMT Right 05/22/2021 Left 05/22/2021  Hip flexion 4 4+  Hip extension 4 4+  Hip abduction 4+ 5  Hip adduction 5 5  Hip internal rotation 5 5  Hip external rotation 4+ 5  Knee flexion  4+ 5  Knee extension 4 pain 5  Ankle dorsiflexion    Ankle plantarflexion    Ankle inversion    Ankle eversion     (Blank rows = not tested)  LOWER EXTREMITY SPECIAL TESTS:  Knee special tests: Anterior drawer test: positive , Posterior drawer test: positive , and McMurray's test: negative Anterior and posterior drawer tests- grade 1 laxity  FUNCTIONAL TESTS:  5 times sit to stand: taken on 05-28-21  11.56 sec 6 minute walk test: 1181Ft  ( normal for age about 2200 ft)   GAIT: Distance walked: Within clinic Assistive device utilized: None Level of assistance: Complete Independence Comments: Pt walks with an antalgic gait pattern over the R LE  [facility] Adult PT Treatment:                                                DATE: 07/04/21 Therapeutic Exercise: Therapeutic Exercise: Elliptical 5 mins, L3, R1  Omega knee ext 3x10, 15# Omega knee flex 3x10, 20# Cybex squats 2x10, 100#, 2x10, 40# R LE only Cybex hip abd 3x10, 32#   [facility] Adult PT Treatment:                                                DATE: 06/27/21  Therapeutic Exercise: Elliptical 5 mins, L1, R1  Wall slides 1x10, 5" Gastroc stretch against wall, 2 x for 20 sec each Lat step ups, 2x10, 8", no hand A SL RDL to 14" cone height, 2x10 Supine hamstring theraball rolls x15 Supine hamstring/glute bridges 2x10, 3" Banded side steps, BTB, 4x22f  Self Care: Banded side steps added to pt's HEP   [facility]                                                DATE: 06/25/21 Therapeutic Exercise: Elliptical 5 mins, L1, R1  Wall slides 2x10, 3" Gastroc stretch against wall, 2 x for 20-30 sec each Lat step ups, 2x10, 4", no hand A Split squats to bosu ball, 2x10, L and R, min hand A Omega knee flexion, 3x10, 20#  Self Care: Reviewed FOTO with pt progressing from 38% to 66%    [facility]  [facility]: 06[facility]21 Therapeutic Exercise: Recumbent bike 5 mins,  L6,  Wall sit x10, 10" feeling slight burn at the end Gastroc stretch against wall, 2 x for 20-30 sec each Pt with question of how to get down on ground to play with baby.  Introduced Reverse lunge with limit to 90 degree knee flexion with  bil UE support  with L LE and one UE with R LE bearing wt  1 x 10 each R SLR c quad set  x10, 3", 6# R prone knee flexionx 2x10, 6# Seated LAQ 2x10, 6# Clamshell with GTB band, 2x15,  15 #KB box squats to mat  2 x 10 Step up on 6 inch step 2 x 10  Added to HEP Access Code: [email or web address] Side Stepping with Resistance at Feet - 1 x daily - 7 x weekly - 3 sets - 10 reps Step Up - 1 x daily - 7 x weekly - 3 sets - 10 reps   HOME EXERCISE PROGRAM: Access Code: [email or web address] URL: [email or web address] Date: 06/13/2021 Prepared by: Gar Ponto  Exercises Supine Heel Slide - 2 x daily - 7 x weekly - 1 sets - 3 reps - 30 hold Active Straight Leg Raise with Quad Set - 2 x daily - 7 x weekly - 2 sets - 10-15 reps - 3 hold Prone Knee Flexion - 2 x daily - 7 x weekly - 2 sets - 10-15 reps - 3 hold Clam with Resistance - 1 x daily - 7 x weekly - 3 sets - 10 reps Gastroc Stretch on Wall - 1 x daily - 7 x weekly - 1 sets - 3 reps - 20-30 hold Wall Squat with Swiss Ball - 1 x daily - 7 x weekly - 3 sets - 10 reps Sitting Knee Extension with Resistance - 1 x daily - 7 x weekly - 2 sets - 10-15 reps - 3 hold Seated Hamstring Curl with Anchored Resistance - 1 x daily - 7 x weekly - 2 sets - 10-15 reps - 3 hold Side Stepping with Resistance at Feet - 1 x daily - 7 x weekly - 3 sets - 10 reps Step Up - 1 x daily - 7 x weekly - 3 sets - 10 reps    ASSESSMENT:  CLINICAL IMPRESSION: Pt presents to PT with increased R knee and foot pain. PT was completed for LE strengthening in the open chain. Pt tolerated the session without adverse effects. Will assess LTGs the next PT session.  REHAB POTENTIAL: Good  CLINICAL DECISION MAKING:  Stable/uncomplicated  EVALUATION COMPLEXITY: Low   GOALS:   SHORT TERM GOALS:  STG Name Target Date Goal status  1 Pt will be Ind in an initial HEP Baseline:  06/12/21 Met 06/27/21  2 Pt will voice understanding of measures to assit in the reduction of pain. Ptuse cold packs as needed Baseline:  06/12/21 Met 06/27/21   LONG TERM GOALS:   LTG Name Target Date Goal status  1 Increase R knee flexion to 120d for improved R knee function Baseline:110d. 06/12/21=118d. 07/09/21 INITIAL  2 Increased R hip flexion and ext strength to 4+/5 or greater and knee ext and flex strength to 5/5 for improved knee function. Baseline: 07/09/21 INITIAL  3 Pt will report a decrease in R knee pain to 3/10 or less with daily activities. Baseline: 3-10/10 07/09/21 INITIAL  4 Pt's functional ability FOTO score will increase to 67% Baseline: 38% 07/09/21 INITIAL  5 Assess 5xSTS and 6 min walking test  and set LTG. 06/13/21: decrease 5xsts to 10 sec or less. Increase 36mt to 15025f For improved functional ability Baseline:11.6sec, 1181 07/09/21 INITIAL  6 Develop higher level functional goals as indicated Baseline: 07/09/21 INITIAL   PLAN: PT FREQUENCY: 2x/week  PT DURATION: 6 weeks  PLANNED INTERVENTIONS: Therapeutic exercises, Therapeutic activity, Neuro Muscular re-education, Balance training, Gait training, Patient/Family education, Joint mobilization, Stair training, Dry Needling, Electrical stimulation, Cryotherapy, Moist heat, Taping, Vasopneumatic device, Ultrasound, Ionotophoresis 39m60ml Dexamethasone, and Manual therapy  PLAN FOR NEXT SESSION: Assess response to HEP and progress therex as indicated. Review HEP and assess LTGs   Jarah Pember MS, PT 07/04/21 3:02 PM

## 2021-07-04 ENCOUNTER — Other Ambulatory Visit: Payer: Self-pay

## 2021-07-04 ENCOUNTER — Ambulatory Visit: Payer: BC Managed Care – PPO | Attending: Orthopedic Surgery

## 2021-07-04 DIAGNOSIS — M6281 Muscle weakness (generalized): Secondary | ICD-10-CM | POA: Diagnosis not present

## 2021-07-04 DIAGNOSIS — R262 Difficulty in walking, not elsewhere classified: Secondary | ICD-10-CM | POA: Diagnosis not present

## 2021-07-04 DIAGNOSIS — R2689 Other abnormalities of gait and mobility: Secondary | ICD-10-CM | POA: Diagnosis not present

## 2021-07-04 DIAGNOSIS — M25661 Stiffness of right knee, not elsewhere classified: Secondary | ICD-10-CM | POA: Diagnosis not present

## 2021-07-09 ENCOUNTER — Other Ambulatory Visit: Payer: Self-pay

## 2021-07-09 ENCOUNTER — Ambulatory Visit: Payer: BC Managed Care – PPO

## 2021-07-09 DIAGNOSIS — M6281 Muscle weakness (generalized): Secondary | ICD-10-CM | POA: Diagnosis not present

## 2021-07-09 DIAGNOSIS — R262 Difficulty in walking, not elsewhere classified: Secondary | ICD-10-CM | POA: Diagnosis not present

## 2021-07-09 DIAGNOSIS — S83511D Sprain of anterior cruciate ligament of right knee, subsequent encounter: Secondary | ICD-10-CM | POA: Diagnosis not present

## 2021-07-09 DIAGNOSIS — R2689 Other abnormalities of gait and mobility: Secondary | ICD-10-CM | POA: Diagnosis not present

## 2021-07-09 DIAGNOSIS — M25661 Stiffness of right knee, not elsewhere classified: Secondary | ICD-10-CM | POA: Diagnosis not present

## 2021-07-09 DIAGNOSIS — S83521D Sprain of posterior cruciate ligament of right knee, subsequent encounter: Secondary | ICD-10-CM | POA: Diagnosis not present

## 2021-07-09 NOTE — Therapy (Signed)
OUTPATIENT PHYSICAL THERAPY TREATMENT NOTE   Patient Name: Mary Gibbs MRN: [id number] DOB:May 22, 1986, 36 y.o., female Today's Date: 07/09/2021  PCP: Carlena Hurl, PA-C REFERRING PROVIDER: Nicholes Stairs, *   PT End of Session - 07/09/21 1347     Visit Number 9    Number of Visits 13    Date for PT Re-Evaluation 07/10/21    Authorization Type BCBS COMM PPO    Progress Note Due on Visit 10    PT Start Time 1340    PT Stop Time 1420    PT Time Calculation (min) 40 min    Activity Tolerance Patient tolerated treatment well    Behavior During Therapy WFL for tasks assessed/performed                  Past Medical History:  Diagnosis Date   Anemia    @ last [place] appt   Depression     no meds currently, was thinking of going to behavior health   H/O hiatal hernia    Hx of ulcer disease    Hx of varicella    Infection    UTI   NSVD (normal spontaneous vaginal delivery) 08/26/2012   Past Surgical History:  Procedure Laterality Date   CHOLECYSTECTOMY N/A 06/11/2017   Procedure: LAPAROSCOPIC CHOLECYSTECTOMY WITH INTRAOPERATIVE CHOLANGIOGRAM;  Surgeon: Donnie Mesa, MD;  Location: [place];  Service: General;  Laterality: N/A;   FOOT SURGERY Left    broken toe, ligament removed from back of leg   TONSILLECTOMY     As a baby   Patient Active Problem List   Diagnosis Date Noted   Postpartum care following vaginal delivery 01/23/2021   Indication for care in labor or delivery 01/22/2021   Dysthymia 06/12/2020   Abnormal urine odor 06/12/2020   Positive pregnancy test 06/12/2020   Vaccine counseling 04/09/2020   Encounter for screening for malignant neoplasm of breast 04/09/2020   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   Anxiety 04/09/2020   Hematuria 03/29/2018   Impaired fasting blood sugar 03/29/2018   Symptomatic cholelithiasis 06/11/2017   Anemia, iron deficiency 08/28/2015   Encounter for health maintenance examination in adult 08/28/2015   Pain of upper  abdomen 08/28/2015   Influenza vaccination declined 08/28/2015   NSVD (normal spontaneous vaginal delivery) 08/26/2012   Perineal laceration with delivery, second degree 08/26/2012   Obesity with serious comorbidity 06/14/2012    REFERRING DIAG: Rupture of ACL, rupture of PLC  THERAPY DIAG:  Muscle weakness (generalized)  Decreased ROM of right knee  Difficulty in walking, not elsewhere classified  Other abnormalities of gait and mobility  PERTINENT HISTORY: surgery of the L foot 2021  PRECAUTIONS: Knee, running, jumping, lunges, squats. Pt reports she has a soft knee brace to wear as needed.  SUBJECTIVE: Pt reports her her has been more sore c rainy and cold weather and also completing aerobic and a spin class. She also reports soreness of her R foot. Steps are better, but descending is more difficult.  PAIN:  Are you having pain? 2/10 VAS scale: 0-6/10 pain range Pain location: Right knee Pain orientation: Posterior  PAIN TYPE: none today Pain description: Occasional Aggravating factors: the cold makes the pain worse Relieving factors: I never have time to relieve my pain because I am taking care of 3 kids  OBJECTIVE:   DIAGNOSTIC FINDINGS: MRI was completed 05/01/21, not available   PATIENT SURVEYS:  FOTO 38% functional ability; 06/25/21- 66%  COGNITION:  Overall cognitive status: Within  functional limits for tasks assessed     SENSATION:  Light touch: Appears intact     MUSCLE LENGTH: Hamstrings: Right TBA deg; Left TBA deg   POSTURE:   Valgus positioning both knees  PALPATION: TTP posterior r knee  LE AROM/PROM:  A/PROM Right 05/22/2021 Left 05/22/2021 Right 05-28-21 Left 05-28-21 Right 07/09/21  Knee flexion 110 125 120 128 122  Knee extension 0 0 0 0 0    LE MMT:  MMT Right 05/22/2021 Left 05/22/2021 Right 07/09/21  Hip flexion 4 4+ 4+  Hip extension 4 4+ 4+  Hip abduction 4+ 5 4+  Hip adduction _0 Hip internal rotation _1 Hip  external rotation 4+ 5 4+  Knee flexion 4+ 5 4+  Knee extension 4 pain 5 4+  Ankle dorsiflexion     Ankle plantarflexion     Ankle inversion     Ankle eversion      (Blank rows = not tested)  LOWER EXTREMITY SPECIAL TESTS:  Knee special tests: Anterior drawer test: positive , Posterior drawer test: positive , and McMurray's test: negative Anterior and posterior drawer tests- grade 1 laxity  FUNCTIONAL TESTS:  5 times sit to stand: taken on 05-28-21  11.56 sec 6 minute walk test: 1181Ft  ( normal for age about 2200 ft)   GAIT: Distance walked: Within clinic Assistive device utilized: None Level of assistance: Complete Independence Comments: Pt walks with an antalgic gait pattern over the R LE  [facility] Adult PT Treatment:                                                DATE: 07/09/21 Therapeutic Exercise: Elliptical 5 mins, L3, R1   Lateral steps, 6", x10, L and R SL RDLs, 2x10 Wall slides, 2x10, 5" Omega knee ext 3x10, 15# Omega knee flex 3x10, 20#  Therapeutic Activity: Asc/dsc 4., 6 inch steps, 6x. No UE assist to asc, min UE assist to dsc STS x5 SL STS, x2, L and R at 23", UE assist due to decreased control   [facility] Adult PT Treatment:                                                DATE: 07/04/21 Therapeutic Exercise: Therapeutic Exercise: Elliptical 5 mins, L3, R1  Omega knee ext 3x10, 15# Omega knee flex 3x10, 20# Cybex squats 2x10, 100#, 2x10, 40# R LE only Cybex hip abd 3x10, 32#   [facility] Adult PT Treatment:                                                DATE: 06/27/21  Therapeutic Exercise: Elliptical 5 mins, L1, R1  Wall slides 1x10, 5" Gastroc stretch against wall, 2 x for 20 sec each Lat step ups, 2x10, 8", no hand A SL RDL to 14" cone height, 2x10 Supine hamstring theraball rolls x15 Supine hamstring/glute bridges 2x10, 3" Banded side steps, BTB, 4x16f  Self Care: Banded side steps added to pt's HEP   [facility]  [facility]: [facility] Therapeutic Exercise: Elliptical 5 mins, L1, R1  Wall slides 2x10, 3" Gastroc stretch against wall, 2 x for 20-30 sec each Lat step ups, 2x10, 4", no hand A Split squats to bosu ball, 2x10, L and R, min hand A Omega knee flexion, 3x10, 20#  Self Care: Reviewed FOTO with pt progressing from 38% to 66%   Added to HEP Access Code: [email or web address] Side Stepping with Resistance at Feet - 1 x daily - 7 x weekly - 3 sets - 10 reps Step Up - 1 x daily - 7 x weekly - 3 sets - 10 reps   HOME EXERCISE PROGRAM: Access Code: [email or web address] URL: [email or web address] Date: 06/13/2021 Prepared by: Gar Ponto  Exercises Supine Heel Slide - 2 x daily - 7 x weekly - 1 sets - 3 reps - 30 hold Active Straight Leg Raise with Quad Set - 2 x daily - 7 x weekly - 2 sets - 10-15 reps - 3 hold Prone Knee Flexion - 2 x daily - 7 x weekly - 2 sets - 10-15 reps - 3 hold Clam with Resistance - 1 x daily - 7 x weekly - 3 sets - 10 reps Gastroc Stretch on Wall - 1 x daily - 7 x weekly - 1 sets - 3 reps - 20-30 hold Wall Squat with Swiss Ball - 1 x daily - 7 x weekly - 3 sets - 10 reps Sitting Knee Extension with Resistance - 1 x daily - 7 x weekly - 2 sets - 10-15 reps - 3 hold Seated Hamstring Curl with Anchored Resistance - 1 x daily - 7 x weekly - 2 sets - 10-15 reps - 3 hold Side Stepping with Resistance at Feet - 1 x daily - 7 x weekly - 3 sets - 10 reps Step Up - 1 x daily - 7 x weekly - 3 sets - 10 reps    ASSESSMENT:  CLINICAL IMPRESSION: Pt is making good progress with the pain, strength and function of the R knee. Pt is progressing toward all goals. PT today continued to work on LE strengthening as well as ability to asc/dsc steps, with dsc causing the pt the most concern with controlling descent. With practice today, pt was able to dsc steps with each leg as the lead and min UE support for confidence. Pt has seen Dr. Stann Mainland and a script has been written for OPPT  to be continued 4 more week. Pt will continue to benefit from skilled PT to increase the strength and function of the R knee for optimized functional mobility c minimized pain.  REHAB POTENTIAL: Good  CLINICAL DECISION MAKING: Stable/uncomplicated  EVALUATION COMPLEXITY: Low   GOALS:   SHORT TERM GOALS:  STG Name Target Date Goal status  1 Pt will be Ind in an initial HEP Baseline:  06/12/21 Met 06/27/21  2 Pt will voice understanding of measures to assit in the reduction of pain. Ptuse cold packs as needed Baseline:  06/12/21 Met 06/27/21   LONG TERM GOALS:   LTG Name Target Date Goal status  1 Increase R knee flexion to 120d for improved R knee function Baseline:110d. 06/12/21=118d. 07/09/21- 122d 07/09/21 Met  2 Increased R hip flexion and ext strength to 4+/5 or greater and knee ext and flex strength to 5/5 for improved knee function.  Baseline:07/09/21 R hip 4+, R knee 4+ 08/16/21 ongoing  3 Pt will report a decrease in R knee pain to 3/10 or less with daily activities. Baseline: 3-10/10.  07/09/21- 0-6/10 08/16/21 ongoing  4 Pt's functional ability FOTO score will increase to 67% Baseline: 38% progress to 66% 08/16/21 ongoing  5 Assess 5xSTS and 6 min walking test and set LTG. 06/13/21: decrease 5xsts to 10 sec or less. Increase 63mt to 15065f For improved functional ability Baseline:11.6sec, 1181. 07/09/21- 9.4 sec c 5xSTS s hands 08/16/21 Partially met  6 Develop higher level functional goals as indicated. 07/09/21- Pt will be able to complete SLS L and R s use od hands and proper control Baseline: Assist needed for SL STS L and R from 23" seat height 08/16/21 INITIAL   PLAN: PT FREQUENCY: 2x/week  PT DURATION: 6 weeks  PLANNED INTERVENTIONS: Therapeutic exercises, Therapeutic activity, Neuro Muscular re-education, Balance training, Gait training, Patient/Family education, Joint mobilization, Stair training, Dry Needling, Electrical stimulation, Cryotherapy, Moist heat, Taping,  Vasopneumatic device, Ultrasound, Ionotophoresis 28m12ml Dexamethasone, and Manual therapy  PLAN FOR NEXT SESSION: Assess response to HEP and progress therex as indicated. Review HEP and assess LTGs. Continue PT 1w4 to progress R knee strength and funciton.   Loann Chahal MS, PT 07/09/21 6:15 PM

## 2021-07-11 DIAGNOSIS — F411 Generalized anxiety disorder: Secondary | ICD-10-CM | POA: Diagnosis not present

## 2021-07-25 DIAGNOSIS — F411 Generalized anxiety disorder: Secondary | ICD-10-CM | POA: Diagnosis not present

## 2021-08-08 DIAGNOSIS — F411 Generalized anxiety disorder: Secondary | ICD-10-CM | POA: Diagnosis not present

## 2021-08-22 DIAGNOSIS — F411 Generalized anxiety disorder: Secondary | ICD-10-CM | POA: Diagnosis not present

## 2021-09-05 DIAGNOSIS — F411 Generalized anxiety disorder: Secondary | ICD-10-CM | POA: Diagnosis not present

## 2021-09-19 DIAGNOSIS — F411 Generalized anxiety disorder: Secondary | ICD-10-CM | POA: Diagnosis not present

## 2021-09-24 DIAGNOSIS — Z6841 Body Mass Index (BMI) 40.0 and over, adult: Secondary | ICD-10-CM | POA: Diagnosis not present

## 2021-09-24 DIAGNOSIS — Z309 Encounter for contraceptive management, unspecified: Secondary | ICD-10-CM | POA: Diagnosis not present

## 2021-09-24 DIAGNOSIS — Z01419 Encounter for gynecological examination (general) (routine) without abnormal findings: Secondary | ICD-10-CM | POA: Diagnosis not present

## 2021-10-03 DIAGNOSIS — F411 Generalized anxiety disorder: Secondary | ICD-10-CM | POA: Diagnosis not present

## 2021-10-17 DIAGNOSIS — F411 Generalized anxiety disorder: Secondary | ICD-10-CM | POA: Diagnosis not present

## 2021-10-31 DIAGNOSIS — F411 Generalized anxiety disorder: Secondary | ICD-10-CM | POA: Diagnosis not present

## 2021-11-01 DIAGNOSIS — N393 Stress incontinence (female) (male): Secondary | ICD-10-CM | POA: Diagnosis not present

## 2021-11-01 NOTE — Progress Notes (Signed)
Called DR. Law's office and spoke with Chenelle and told her pt cancelled her surgery for November 11, 2021.  Chenelle said she didn't want it cancelled but rescheduled and never called her back to reschedule so Chenelle will reach out to the pt .

## 2021-11-14 DIAGNOSIS — F411 Generalized anxiety disorder: Secondary | ICD-10-CM | POA: Diagnosis not present

## 2021-11-28 ENCOUNTER — Other Ambulatory Visit: Payer: Self-pay | Admitting: Obstetrics and Gynecology

## 2021-11-28 DIAGNOSIS — F411 Generalized anxiety disorder: Secondary | ICD-10-CM | POA: Diagnosis not present

## 2021-12-09 NOTE — Progress Notes (Signed)
12/09/2021 4:41 PM Spoke with patient, she states that MD office has never spoken to her about rescheduling this procedure and she was unaware of a planned surgery date of 7/24. Advised patient to call MD office in the AM and surgery center will reach back out to her on Thursday 7/13 to confirm plans and review instructions at that time if needed. Pt. Agreeable with plan.  \Jaeanna Mccomber, Blanchard Kelch

## 2021-12-12 DIAGNOSIS — F411 Generalized anxiety disorder: Secondary | ICD-10-CM | POA: Diagnosis not present

## 2021-12-16 NOTE — Progress Notes (Signed)
Spoke with patient, she stated she was going to re-schedule or cancel procedure. Informed patient she needed to call Dr Pasty Arch office to re-schedule/cancel procedure. Patient verbalized understanding.

## 2021-12-18 NOTE — Progress Notes (Signed)
Spoke with patient, she stated she has called dr law office to cancel 12-23-2021 surgery and she has not heard back from dr law office, Patient stated she would call dr law office again today to reschedule or cancel procedure for 12-23-2021.

## 2021-12-23 ENCOUNTER — Ambulatory Visit (HOSPITAL_BASED_OUTPATIENT_CLINIC_OR_DEPARTMENT_OTHER)
Admission: RE | Admit: 2021-12-23 | Payer: BC Managed Care – PPO | Source: Home / Self Care | Admitting: Obstetrics and Gynecology

## 2021-12-23 SURGERY — LIGATION, FALLOPIAN TUBE, LAPAROSCOPIC
Anesthesia: General | Laterality: Bilateral

## 2021-12-26 DIAGNOSIS — F411 Generalized anxiety disorder: Secondary | ICD-10-CM | POA: Diagnosis not present

## 2022-01-09 DIAGNOSIS — F411 Generalized anxiety disorder: Secondary | ICD-10-CM | POA: Diagnosis not present

## 2022-01-15 DIAGNOSIS — Z3200 Encounter for pregnancy test, result unknown: Secondary | ICD-10-CM | POA: Diagnosis not present

## 2022-01-15 DIAGNOSIS — Z3689 Encounter for other specified antenatal screening: Secondary | ICD-10-CM | POA: Diagnosis not present

## 2022-01-23 DIAGNOSIS — F411 Generalized anxiety disorder: Secondary | ICD-10-CM | POA: Diagnosis not present

## 2022-01-24 ENCOUNTER — Encounter: Payer: BC Managed Care – PPO | Admitting: Medical

## 2022-01-24 DIAGNOSIS — L02211 Cutaneous abscess of abdominal wall: Secondary | ICD-10-CM | POA: Diagnosis not present

## 2022-01-29 ENCOUNTER — Other Ambulatory Visit (HOSPITAL_COMMUNITY)
Admission: EM | Admit: 2022-01-29 | Discharge: 2022-01-31 | Disposition: A | Discharge status: Home / Self Care | Payer: BC Managed Care – PPO | Attending: Psychiatry | Admitting: Psychiatry

## 2022-01-29 DIAGNOSIS — R45851 Suicidal ideations: Secondary | ICD-10-CM | POA: Insufficient documentation

## 2022-01-29 DIAGNOSIS — Z20822 Contact with and (suspected) exposure to covid-19: Secondary | ICD-10-CM | POA: Insufficient documentation

## 2022-01-29 DIAGNOSIS — F4310 Post-traumatic stress disorder, unspecified: Secondary | ICD-10-CM | POA: Insufficient documentation

## 2022-01-29 DIAGNOSIS — F411 Generalized anxiety disorder: Secondary | ICD-10-CM | POA: Insufficient documentation

## 2022-01-29 DIAGNOSIS — F332 Major depressive disorder, recurrent severe without psychotic features: Secondary | ICD-10-CM | POA: Diagnosis present

## 2022-01-29 LAB — CBC WITH DIFFERENTIAL/PLATELET
Abs Immature Granulocytes: 0.03 10*3/uL (ref 0.00–0.07)
Basophils Absolute: 0 10*3/uL (ref 0.0–0.1)
Basophils Relative: 0 %
Eosinophils Absolute: 0.2 10*3/uL (ref 0.0–0.5)
Eosinophils Relative: 2 %
HCT: 36.5 % (ref 36.0–46.0)
Hemoglobin: 12.3 g/dL (ref 12.0–15.0)
Immature Granulocytes: 0 %
Lymphocytes Relative: 23 %
Lymphs Abs: 2.2 10*3/uL (ref 0.7–4.0)
MCH: 31.1 pg (ref 26.0–34.0)
MCHC: 33.7 g/dL (ref 30.0–36.0)
MCV: 92.2 fL (ref 80.0–100.0)
Monocytes Absolute: 0.3 10*3/uL (ref 0.1–1.0)
Monocytes Relative: 4 %
Neutro Abs: 6.6 10*3/uL (ref 1.7–7.7)
Neutrophils Relative %: 71 %
Platelets: 366 10*3/uL (ref 150–400)
RBC: 3.96 MIL/uL (ref 3.87–5.11)
RDW: 12.7 % (ref 11.5–15.5)
WBC: 9.4 10*3/uL (ref 4.0–10.5)
nRBC: 0 % (ref 0.0–0.2)

## 2022-01-29 LAB — COMPREHENSIVE METABOLIC PANEL
ALT: 12 U/L (ref 0–44)
AST: 16 U/L (ref 15–41)
Albumin: 3.4 g/dL — ABNORMAL LOW (ref 3.5–5.0)
Alkaline Phosphatase: 48 U/L (ref 38–126)
Anion gap: 9 (ref 5–15)
BUN: 8 mg/dL (ref 6–20)
CO2: 22 mmol/L (ref 22–32)
Calcium: 9.3 mg/dL (ref 8.9–10.3)
Chloride: 104 mmol/L (ref 98–111)
Creatinine, Ser: 0.68 mg/dL (ref 0.44–1.00)
GFR, Estimated: >60 mL/min (ref >60)
Glucose, Bld: 96 mg/dL (ref 70–99)
Potassium: 4.6 mmol/L (ref 3.5–5.1)
Sodium: 135 mmol/L (ref 135–145)
Total Bilirubin: 0.7 mg/dL (ref 0.3–1.2)
Total Protein: 7.1 g/dL (ref 6.5–8.1)

## 2022-01-29 LAB — LIPID PANEL
Cholesterol: 194 mg/dL (ref 0–200)
HDL: 52 mg/dL (ref >40)
LDL Cholesterol: 130 mg/dL — ABNORMAL HIGH (ref 0–99)
Total CHOL/HDL Ratio: 3.7 RATIO
Triglycerides: 58 mg/dL (ref <150)
VLDL: 12 mg/dL (ref 0–40)

## 2022-01-29 LAB — RESP PANEL BY RT-PCR (FLU A&B, COVID) ARPGX2
Influenza A by PCR: NEGATIVE
Influenza B by PCR: NEGATIVE
SARS Coronavirus 2 by RT PCR: NEGATIVE

## 2022-01-29 LAB — POCT URINE DRUG SCREEN - MANUAL ENTRY (I-SCREEN)
POC Amphetamine UR: NOT DETECTED
POC Buprenorphine (BUP): NOT DETECTED
POC Cocaine UR: NOT DETECTED
POC Marijuana UR: POSITIVE — AB
POC Methadone UR: NOT DETECTED
POC Methamphetamine UR: NOT DETECTED
POC Morphine: NOT DETECTED
POC Oxazepam (BZO): NOT DETECTED
POC Oxycodone UR: POSITIVE — AB
POC Secobarbital (BAR): NOT DETECTED

## 2022-01-29 LAB — TSH: TSH: 0.982 u[IU]/mL (ref 0.350–4.500)

## 2022-01-29 LAB — ETHANOL: Alcohol, Ethyl (B): <10 mg/dL (ref <10)

## 2022-01-29 LAB — POCT PREGNANCY, URINE
Preg Test, Ur: POSITIVE — AB
Preg Test, Ur: POSITIVE — AB

## 2022-01-29 LAB — HEMOGLOBIN A1C
Hgb A1c MFr Bld: 5.2 % (ref 4.8–5.6)
Mean Plasma Glucose: 102.54 mg/dL

## 2022-01-29 LAB — POC SARS CORONAVIRUS 2 AG: SARSCOV2ONAVIRUS 2 AG: NEGATIVE

## 2022-01-29 MED ORDER — IBUPROFEN 200 MG PO TABS
200.0000 mg | ORAL_TABLET | Freq: Three times a day (TID) | ORAL | Status: DC | PRN
  Administered 2022-01-29 – 2022-01-30 (×2): 200 mg via ORAL
  Filled 2022-01-29 (×3): qty 1

## 2022-01-29 MED ORDER — ALUM & MAG HYDROXIDE-SIMETH 200-200-20 MG/5ML PO SUSP: 30.0000 mL | ORAL | Status: DC | PRN

## 2022-01-29 MED ORDER — ACETAMINOPHEN 325 MG PO TABS: 650.0000 mg | ORAL_TABLET | Freq: Four times a day (QID) | ORAL | Status: DC | PRN

## 2022-01-29 MED ORDER — TRAZODONE HCL 50 MG PO TABS
50.0000 mg | ORAL_TABLET | Freq: Every evening | ORAL | Status: DC | PRN
  Administered 2022-01-29 – 2022-01-30 (×2): 50 mg via ORAL
  Filled 2022-01-29: qty 1
  Filled 2022-01-29: qty 7
  Filled 2022-01-29: qty 1

## 2022-01-29 MED ORDER — MAGNESIUM HYDROXIDE 400 MG/5ML PO SUSP
30.0000 mL | Freq: Every day | ORAL | Status: DC | PRN
  Administered 2022-01-29 – 2022-01-30 (×2): 30 mL via ORAL
  Filled 2022-01-29 (×2): qty 30

## 2022-01-29 MED ORDER — VENLAFAXINE HCL ER 75 MG PO CP24
75.0000 mg | ORAL_CAPSULE | Freq: Every day | ORAL | Status: DC
  Administered 2022-01-29 – 2022-01-30 (×2): 75 mg via ORAL
  Filled 2022-01-29 (×2): qty 1

## 2022-01-29 MED ORDER — HYDROXYZINE HCL 25 MG PO TABS
25.0000 mg | ORAL_TABLET | Freq: Three times a day (TID) | ORAL | Status: DC | PRN
  Administered 2022-01-29 – 2022-01-31 (×3): 25 mg via ORAL
  Filled 2022-01-29 (×3): qty 1
  Filled 2022-01-29: qty 10

## 2022-01-29 NOTE — BH Assessment (Signed)
Comprehensive Clinical Assessment (CCA) Note  01/29/2022 Mary Gibbs [id number]  Disposition pending provider evaluation  The patient demonstrates the following risk factors for suicide: Chronic risk factors for suicide include: psychiatric disorder of depression . Acute risk factors for suicide include: family or marital conflict and loss (financial, interpersonal, professional). Protective factors for this patient include: positive therapeutic relationship, responsibility to others (children, family), and hope for the future. Considering these factors, the overall suicide risk at this point appears to be low. Patient is appropriate for outpatient follow up.    GAD-7    Flowsheet Row ED from 01/29/2022 in [facility]  Total GAD-7 Score 10      PHQ2-9    Flowsheet Row ED from 01/29/2022 in [facility] Office Visit from 04/09/2020 in [facility]  PHQ-2 Total Score 4 0  PHQ-9 Total Score 16 --      Flowsheet Row ED from 01/29/2022 in [facility] Admission (Discharged) from 01/22/2021 in [facility] 4S Mother Baby Unit Admission (Discharged) from 01/05/2021 in [facility] 1S Maternity Assessment Unit  C-SSRS RISK CATEGORY No Risk No Risk No Risk       Chief Complaint:  Chief Complaint  Patient presents with   Depression   Anxiety   Visit Diagnosis: F33.2 MDD Recurrent Severe    CCA Screening, Triage and Referral (STR)  Patient Reported Information How did you hear about [place]? No data recorded What Is the Reason for Your Visit/Call Today? Patient presents to the [facility] seeking help for her depression.  Patient states that she has a history of depression and currently sees a therapist at [employer], Bridgette Habermann. She used to be prescribed Sertraline, but has not been on that medication in a good while.  Patient states that she has been more depressed than usual and states that she just does not  care about anything.  She states that she does not feel like she would harm herself, but states that she is struggling. She denies any previous suicide attempts.  Patient states that she just had an abortion yesterday.  Patient states that she lives with her fiance and three children, but she states that he is not supportive.  Patient denies HI/Psychosis.  States that she smokes marijuana occasionally with her last use 1 week ago. Patient states that she is not sleeping or eating well. 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  Patient describes her life growing up as "trash."  She states that she was raised by her father and his parents.  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  Patient states that she has no history of abuse or self-mutilation.  She denies having any current legal issues.  Patient is alert and oriented.  Her mood is depressed, her affect flat and she is moderately anxious.  Her insight, judgment and impulse control are mostly intact.  Her thoughts are organized and her memory is intact.  She does not appear to be responding to any internal stimuli.  Her speech is normal in tone and rate and her eye contact is good.  How Long Has This Been Causing You Problems? 1-6 months  What Do You Feel Would Help You the Most Today? Treatment for Depression or other mood problem   Have You Recently Had Any Thoughts About Hurting Yourself? No  Are You Planning to Commit Suicide/Harm Yourself At  This time? No   Have you Recently Had Thoughts About [place]? No  Are You Planning to Harm Someone at This Time? No  Explanation: No data recorded  Have You Used Any Alcohol or Drugs in the Past 24 Hours? Yes  How Long Ago Did You Use Drugs or Alcohol? No data recorded What Did You Use and How Much? smokes marijuana on occasion   Do You Currently Have a Therapist/Psychiatrist? No data recorded Name of  Therapist/Psychiatrist: No data recorded  Have You Been Recently Discharged From Any Office Practice or Programs? No data recorded Explanation of Discharge From Practice/Program: No data recorded    CCA Screening Triage Referral Assessment Type of Contact: No data recorded Telemedicine Service Delivery:   Is this Initial or Reassessment? No data recorded Date Telepsych consult ordered in [facility]:  No data recorded Time Telepsych consult ordered in [facility]:  No data recorded Location of Assessment: No data recorded Provider Location: No data recorded  Collateral Involvement: No data recorded  Does Patient Have a [place]? No data recorded Name and Contact of Legal Guardian: No data recorded If Minor and Not Living with Parent(s), Who has Custody? No data recorded Is CPS involved or ever been involved? No data recorded Is APS involved or ever been involved? No data recorded  Patient Determined To Be At Risk for Harm To Self or Others Based on Review of Patient Reported Information or Presenting Complaint? No data recorded Method: No data recorded Availability of Means: No data recorded Intent: No data recorded Notification Required: No data recorded Additional Information for Danger to Others Potential: No data recorded Additional Comments for Danger to Others Potential: No data recorded Are There Guns or Other Weapons in Your Home? No data recorded Types of Guns/Weapons: No data recorded Are These Weapons Safely Secured?                            No data recorded Who Could Verify You Are Able To Have These Secured: No data recorded Do You Have any Outstanding Charges, Pending Court Dates, Parole/Probation? No data recorded Contacted To Inform of Risk of Harm To Self or Others: No data recorded   Does Patient Present under Involuntary Commitment? No data recorded IVC Papers Initial File Date: No data recorded  [place] of Residence: No data recorded  Patient  Currently Receiving the Following Services: No data recorded  Determination of Need: Urgent (48 hours)   Options For Referral: Medication Management; Outpatient Therapy; Facility-Based Crisis     CCA Biopsychosocial Patient Reported Schizophrenia/Schizoaffective Diagnosis in Past: No   Strengths: Patient is an LPN, a mother and likes helping others   Mental Health Symptoms Depression:   Change in energy/activity; Hopelessness; Increase/decrease in appetite; Sleep (too much or little); Tearfulness   Duration of Depressive symptoms:  Duration of Depressive Symptoms: Greater than two weeks   Mania:   None   Anxiety:    Sleep; Tension; Worrying; Difficulty concentrating   Psychosis:   None   Duration of Psychotic symptoms:    Trauma:   None   Obsessions:   None   Compulsions:   None   Inattention:   None   Hyperactivity/Impulsivity:   None   Oppositional/Defiant Behaviors:   None   Emotional Irregularity:   Intense/unstable relationships   Other Mood/Personality Symptoms:   dpressed mood, flat affect, tearful    Mental Status Exam Appearance and self-care  Stature:   Average   Weight:   Overweight   Clothing:   Casual; Neat/clean   Grooming:   Normal   Cosmetic use:   Age appropriate   Posture/gait:   Normal   Motor activity:   Not Remarkable   Sensorium  Attention:   Normal   Concentration:   Anxiety interferes   Orientation:   Object; Person; Place; Situation; Time   Recall/memory:  No data recorded  Affect and Mood  Affect:   Flat   Mood:   Depressed   Relating  Eye contact:   Normal   Facial expression:   Depressed; Sad   Attitude toward examiner:   Cooperative   Thought and Language  Speech flow:  Clear and Coherent   Thought content:   Appropriate to Mood and Circumstances   Preoccupation:   None   Hallucinations:   None   Organization:  No data recorded  [employer] of  Knowledge:   Good   Intelligence:   Above Average   Abstraction:   Normal   Judgement:   Fair   Reality Testing:   Realistic   Insight:   Good   Decision Making:   Normal   Social Functioning  Social Maturity:   Responsible   Social Judgement:   Normal   Stress  Stressors:   Grief/losses; Relationship   Coping Ability:   Deficient supports   Skill Deficits:   None   Supports:   Support needed     Religion: Religion/Spirituality Are You A Religious Person?:  (not assessed)  Leisure/Recreation: Leisure / Recreation Do You Have Hobbies?: No  Exercise/Diet: Exercise/Diet Do You Exercise?: No Have You Gained or Lost A Significant Amount of Weight in the Past Six Months?: No Do You Follow a Special Diet?: No Do You Have Any Trouble Sleeping?: Yes Explanation of Sleeping Difficulties: Patient states that she has not been sleeping well   CCA Employment/Education Employment/Work Situation: Employment / Work Situation Employment Situation: Employed Work Stressors: none reported Patient's Job has Been Impacted by Current Illness: No Has Patient ever Been in Passenger transport manager?: No  Education: Education Is Patient Currently Attending School?: No Last Grade Completed: 12 Did You Nutritional therapist?: Yes What Type of College Degree Do you Have?: Patient is a LPN Did You Have An Individualized Education Program (IIEP): No Did You Have Any Difficulty At School?: No Patient's Education Has Been Impacted by Current Illness: No   CCA Family/Childhood History Family and Relationship History: Family history Marital status: Single Does patient have children?: Yes How many children?: 3 How is patient's relationship with their children?: Patient states that she has three children ages 49, 72 and 1 and states that she is close to them and their primary care giver  Childhood History:  Childhood History By whom was/is the patient raised?: Father, Grandparents Did  patient suffer any verbal/emotional/physical/sexual abuse as a child?: No Has patient ever been sexually abused/assaulted/raped as an adolescent or adult?: No Was the patient ever a victim of a crime or a disaster?: No Witnessed domestic violence?: No Has patient been affected by domestic violence as an adult?: No  Child/Adolescent Assessment:     CCA Substance Use Alcohol/Drug Use: Alcohol / Drug Use Pain Medications: see MAR Prescriptions: see MAR Over the Counter: see MAR History of alcohol / drug use?: Yes Longest period of sobriety (when/how long): N/A Withdrawal Symptoms: None Substance #1 Name of Substance 1: Marijuana 1 - Age of First Use: not  assessed 1 - Amount (size/oz): small amount 1 - Frequency: occasionally 1 - Duration: not assessed 1 - Last Use / Amount: last use was 1 week ago 1 - Method of Aquiring: unknown 1- Route of Use: smoke                       ASAM's:  Six Dimensions of Multidimensional Assessment  Dimension 1:  Acute Intoxication and/or Withdrawal Potential:   Dimension 1:  Description of individual's past and current experiences of substance use and withdrawal: Patient has no current sighs and symptoms of withdrawal  Dimension 2:  Biomedical Conditions and Complications:   Dimension 2:  Description of patient's biomedical conditions and  complications: Patient has no current medical conditions  Dimension 3:  Emotional, Behavioral, or Cognitive Conditions and Complications:  Dimension 3:  Description of emotional, behavioral, or cognitive conditions and complications: Patient has a history of depression and is off medication  Dimension 4:  Readiness to Change:  Dimension 4:  Description of Readiness to Change criteria: Patient states that she is seeking help for her depression, does not have problematic drug use  Dimension 5:  Relapse, Continued use, or Continued Problem Potential:  Dimension 5:  Relapse, continued use, or continued problem  potential critiera description: Patient lacks effective coping mechanisms to prevent relapse  Dimension 6:  Recovery/Living Environment:  Dimension 6:  Recovery/Iiving environment criteria description: Patient states that her home environment is not supportive  ASAM Severity Score: ASAM's Severity Rating Score: 7  ASAM Recommended Level of Treatment: ASAM Recommended Level of Treatment: Level I Outpatient Treatment   Substance use Disorder (SUD) Substance Use Disorder (SUD)  Checklist Symptoms of Substance Use: Continued use despite having a persistent/recurrent physical/psychological problem caused/exacerbated by use  Recommendations for Services/Supports/Treatments: Recommendations for Services/Supports/Treatments Recommendations For Services/Supports/Treatments: Individual Therapy  Discharge Disposition:    DSM5 Diagnoses: Patient Active Problem List   Diagnosis Date Noted   Severe episode of recurrent major depressive disorder, without psychotic features (HCC)    Postpartum care following vaginal delivery 01/23/2021   Indication for care in labor or delivery 01/22/2021   Dysthymia 06/12/2020   Abnormal urine odor 06/12/2020   Positive pregnancy test 06/12/2020   Vaccine counseling 04/09/2020   Encounter for screening for malignant neoplasm of breast 04/09/2020   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   Anxiety 04/09/2020   Hematuria 03/29/2018   Impaired fasting blood sugar 03/29/2018   Symptomatic cholelithiasis 06/11/2017   Anemia, iron deficiency 08/28/2015   Encounter for health maintenance examination in adult 08/28/2015   Pain of upper abdomen 08/28/2015   Influenza vaccination declined 08/28/2015   NSVD (normal spontaneous vaginal delivery) 08/26/2012   Perineal laceration with delivery, second degree 08/26/2012   Obesity with serious comorbidity 06/14/2012     Referrals to Alternative Service(s): Referred to Alternative Service(s):   Place:   Date:   Time:    Referred to  Alternative Service(s):   Place:   Date:   Time:    Referred to Alternative Service(s):   Place:   Date:   Time:    Referred to Alternative Service(s):   Place:   Date:   Time:     Kaela Beitz J Rania Prothero, LCAS

## 2022-01-29 NOTE — ED Notes (Signed)
Received patient this PM. Patient is watching TV with peers in dayroom. Patient respirations are even and unlabored. Will continue to monitor for safety.

## 2022-01-29 NOTE — ED Notes (Signed)
Patient arrived on unit.

## 2022-01-29 NOTE — Progress Notes (Signed)
Patient presents to the [facility] seeking help for her depression.  Patient states that she has a history of depression and currently sees a therapist at [employer], Bridgette Habermann. She used to be prescribed Sertraline, but has not been on that medication in a good while.  Patient states that she has been more depressed than usual and states that she just does not care about anything.  She states that she does not feel like she would harm herself, but states that she is struggling. She denies any previous suicide attempts.  Patient states that she just had an abortion yesterday.  Patient states that she lives with her fiance and three children, but she states that he is not supportive.  Patient denies HI/Psychosis.  States that she smokes marijuana occasionally with her last use 1 week ago. Patient states that she is not sleeping or eating well. 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

## 2022-01-29 NOTE — ED Notes (Signed)
Pt was searched and oriented to room. She has flat sad affect.. pt refused meal and juice.  She was given an extra blanket and is currently sleeping.  Breathing even and unlabored.  Awaiting lab results to transfer to [facility].

## 2022-01-29 NOTE — ED Notes (Signed)
[facility] lab courier called to transport labs to [facility] lab.  Pt unable to provide urine sample at this time.  Will provide fluids.  Pt has cup.

## 2022-01-29 NOTE — ED Notes (Addendum)
Pt admitted to [facility]  d/t endorsing passive SI no plan or intent. Pt states, "I had an abortion yesterday and my fiance act like it was no big deal. He never do anything to make me feel good about myself. We've been together 10 yrs and we've been engaged for the last 5 years. He act like he don't want me. I already feel unloved by my mother. She was a terrible mother. My sister is no better. I have 3 children, 1, 9 & 37yo. Every since I had my last one, everything have gone bad for me. 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 My fiance only do what he have to do, that's it. No extra (crying)". Support and encouragement provided. Pt remained tearful throughout the assessment process. Denies HI/AVH. Cooperative throughout interview process. Skin assessment completed. Oriented to unit. Meal and drink offered. At currrent, pt continue to endorse passive SI no plan or intent. Pt states, "I would never do anything to myself. I have my children to take care of". Pt tested positive for pregnancy test and oxy's but pt had a spontaneous abortion yesterday (01/28/22) and was given pain medication at clinic. MD made aware. Pt verbally contract for safety. Will monitor for safety.
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
## 2022-01-30 ENCOUNTER — Encounter (HOSPITAL_COMMUNITY): Payer: Self-pay | Admitting: Student

## 2022-01-30 DIAGNOSIS — R45851 Suicidal ideations: Secondary | ICD-10-CM

## 2022-01-30 MED ORDER — VENLAFAXINE HCL ER 150 MG PO CP24
150.0000 mg | ORAL_CAPSULE | Freq: Every day | ORAL | Status: DC
  Filled 2022-01-30: qty 7

## 2022-01-30 MED ORDER — VENLAFAXINE HCL ER 75 MG PO CP24
75.0000 mg | ORAL_CAPSULE | Freq: Every day | ORAL | Status: AC
  Administered 2022-01-30: 75 mg via ORAL
  Filled 2022-01-30: qty 1

## 2022-01-30 MED ORDER — HYDROXYZINE HCL 25 MG PO TABS: 25.0000 mg | ORAL_TABLET | Freq: Three times a day (TID) | ORAL | 0 refills | Status: DC | PRN

## 2022-01-30 MED ORDER — SULFAMETHOXAZOLE-TRIMETHOPRIM 800-160 MG PO TABS
1.0000 | ORAL_TABLET | Freq: Two times a day (BID) | ORAL | Status: DC
  Administered 2022-01-30 (×2): 1 via ORAL
  Filled 2022-01-30 (×2): qty 1

## 2022-01-30 MED ORDER — HYDROXYZINE HCL 25 MG PO TABS: 25.0000 mg | ORAL_TABLET | Freq: Three times a day (TID) | ORAL | 1 refills | Status: DC | PRN

## 2022-01-30 MED ORDER — VENLAFAXINE HCL ER 150 MG PO CP24: 150.0000 mg | ORAL_CAPSULE | Freq: Every day | ORAL | 0 refills | Status: DC

## 2022-01-30 MED ORDER — TRAZODONE HCL 50 MG PO TABS: 50.0000 mg | ORAL_TABLET | Freq: Every evening | ORAL | 1 refills | Status: DC | PRN

## 2022-01-30 MED ORDER — VENLAFAXINE HCL ER 150 MG PO CP24: 150.0000 mg | ORAL_CAPSULE | Freq: Every day | ORAL | 1 refills | Status: DC

## 2022-01-30 MED ORDER — TRAZODONE HCL 50 MG PO TABS: 50.0000 mg | ORAL_TABLET | Freq: Every evening | ORAL | 0 refills | Status: DC | PRN

## 2022-01-30 NOTE — Discharge Instructions (Addendum)

## 2022-01-30 NOTE — ED Notes (Signed)
Patient asleep in bed without issue or complaint.  Will monitor. 

## 2022-01-30 NOTE — ED Notes (Signed)
Patient attended group discussion and watched a video on vulnerability, the group discussed different aspects on learning to accept yourself first then others, dealing with the present before moving on with the future, Alcohol and addiction. Acceptance and denial, the group was headed by the nurse. 

## 2022-01-30 NOTE — ED Provider Notes (Signed)
Patient was agreeable to engaging in a therapy session today.  Discussed case with Dr. Jolene Provost, faculty supervisor, before hand.  Spent approximately 30 minutes with the patient, engaging in brief supportive therapy.  Most of this time was spent discussing the patient's relationship with her fianc.   Carlyn Reichert, MD PGY-2

## 2022-01-30 NOTE — Progress Notes (Signed)
Patient attended a group with the chaplain. 

## 2022-01-30 NOTE — Progress Notes (Signed)
Pt attended AA meeting.  

## 2022-01-30 NOTE — ED Provider Notes (Signed)
FBC Progress Note  Date and Time: 01/30/2022 9:19 AM Name: Mary Gibbs MRN:  [id number]  Reason For Admission: Depression and recent medical induced abortion  Subjective:  patient seen and assessed at bedside. Denies SI/HI/AVH. Reports sleeping well and the first time she has had in a long time. Felt well-rested and was able to resume sleep after waking up once. Patient working on writing her stressors out and how to address them in a practical way.   Amenable to effexor increase today to 150 mg daily. Trazodone very beneficial and hydroxyzine as needed for anxiety.  Diagnosis:  Final diagnoses:  Suicidal ideation  Severe episode of recurrent major depressive disorder, without psychotic features (HCC)    Total Time spent with patient: 45 minutes   Labs  Lab Results:     Latest Ref Rng & Units 01/29/2022   10:31 AM 01/24/2021    4:47 AM 01/22/2021   11:30 PM  CBC  WBC 4.0 - 10.5 K/uL 9.4  10.7  10.2   Hemoglobin 12.0 - 15.0 g/dL 03.4  9.2  74.2   Hematocrit 36.0 - 46.0 % 36.5  28.9  34.8   Platelets 150 - 400 K/uL 366  227  266       Latest Ref Rng & Units 01/29/2022   10:31 AM 04/09/2020    2:26 PM 06/11/2017   11:55 AM  CMP  Glucose 70 - 99 mg/dL 96  98    BUN 6 - 20 mg/dL 8  11    Creatinine 5.95 - 1.00 mg/dL 6.38  7.56  4.33   Sodium 135 - 145 mmol/L 135  138    Potassium 3.5 - 5.1 mmol/L 4.6  4.8    Chloride 98 - 111 mmol/L 104  100    CO2 22 - 32 mmol/L 22  24    Calcium 8.9 - 10.3 mg/dL 9.3  9.9    Total Protein 6.5 - 8.1 g/dL 7.1  8.3    Total Bilirubin 0.3 - 1.2 mg/dL 0.7  0.3    Alkaline Phos 38 - 126 U/L 48  68    AST 15 - 41 U/L 16  17    ALT 0 - 44 U/L 12  13      Physical Findings   GAD-7    Flowsheet Row ED from 01/29/2022 in [facility]  Total GAD-7 Score 10      PHQ2-9    Flowsheet Row ED from 01/29/2022 in [facility] Office Visit from 04/09/2020 in [facility]  PHQ-2  Total Score 4 0  PHQ-9 Total Score 16 --      Flowsheet Row ED from 01/29/2022 in [facility] Admission (Discharged) from 01/22/2021 in [facility] Admission (Discharged) from 01/05/2021 in [facility] 1S Maternity Assessment Unit  C-SSRS RISK CATEGORY Low Risk No Risk No Risk        Musculoskeletal  Strength & Muscle Tone: within normal limits Gait & Station: normal Patient leans: N/A  Psychiatric Specialty Exam  Presentation  General Appearance: Casual   Eye Contact:Fair   Speech:Clear and Coherent   Speech Volume:Normal   Handedness:No data recorded   Mood and Affect  Mood:Anxious; Depressed   Affect:Congruent; Tearful    Thought Process  Thought Processes:Coherent; Goal Directed; Linear   Descriptions of Associations:Intact   Orientation:Full (Time, Place and Person)   Thought Content:Logical   Diagnosis of Schizophrenia or Schizoaffective disorder in past:  No     Hallucinations:Hallucinations: None   Ideas of Reference:None   Suicidal Thoughts:Suicidal Thoughts: Yes, Passive SI Passive Intent and/or Plan: Without Intent; Without Plan   Homicidal Thoughts:Homicidal Thoughts: No    Sensorium  Memory:Immediate Good; Recent Good; Remote Good   Judgment:Fair   Insight:Fair    Executive Functions  Concentration:Good   Attention Span:Good   Recall:Good   Fund of Knowledge:Good   Language:Good    Psychomotor Activity  Psychomotor Activity:Psychomotor Activity: Normal    Assets  Assets:Housing; Manufacturing systems engineer; Desire for Improvement; Financial Resources/Insurance    Sleep  Sleep:Sleep: Fair    Physical Exam  Physical Exam Vitals and nursing note reviewed.  Constitutional:      General: She is not in acute distress.    Appearance: She is well-developed.  HENT:     Head: Normocephalic and atraumatic.  Eyes:     Conjunctiva/sclera: Conjunctivae normal.   Cardiovascular:     Rate and Rhythm: Normal rate and regular rhythm.     Heart sounds: No murmur heard. Pulmonary:     Effort: Pulmonary effort is normal. No respiratory distress.     Breath sounds: Normal breath sounds.  Abdominal:     Palpations: Abdomen is soft.     Tenderness: There is no abdominal tenderness.  Musculoskeletal:        General: No swelling.     Cervical back: Neck supple.  Skin:    General: Skin is warm and dry.     Capillary Refill: Capillary refill takes less than 2 seconds.  Neurological:     Mental Status: She is alert.  Psychiatric:        Mood and Affect: Mood normal.    Review of Systems  Respiratory:  Negative for shortness of breath.   Cardiovascular:  Negative for chest pain.  Gastrointestinal:  Negative for abdominal pain, constipation, diarrhea, heartburn, nausea and vomiting.  Neurological:  Negative for headaches.   Blood pressure 111/70, pulse 86, temperature 98.8 F (37.1 C), temperature source Oral, resp. rate 18, SpO2 100 %, unknown if currently breastfeeding. There is no height or weight on file to calculate BMI.  ASSESSMENT Mary Gibbs is a 36 year old female with a past psychiatric history of PTSD.   PLAN Major depressive disorder, recurrent, severe R/o adjustment disorder - Increase Effexor to 150 mg daily for depressive symptoms -Trazodone 50 mg qhs prn for insomnia -Hydroxyzine 25 mg tid prn for anxiety  Dispo Discharge tomorrow. Patient already has therapist. Arranging for OP psychiatry follow up with Dr. Angelina Sheriff, MD 01/30/2022 9:19 AM

## 2022-01-30 NOTE — ED Notes (Signed)
Pt. Attended AA for 35 mins and then left. Stood at the nurses station waiting for the phones to be turned on after AA is done so she can use the phone to make a call.

## 2022-01-31 ENCOUNTER — Ambulatory Visit: Payer: BC Managed Care – PPO | Admitting: Medical

## 2022-01-31 NOTE — ED Provider Notes (Signed)
FBC/OBS ASAP Discharge Summary  Date and Time: 01/31/2022 7:35 AM  Name: Mary Gibbs  MRN:  [id number]   Discharge Diagnoses:  Final diagnoses:  Suicidal ideation  Severe episode of recurrent major depressive disorder, without psychotic features ([place])    Subjective: Seen and assess  Stay Summary by Problem List Mary Gibbs is a 36 y.o.female with a history of PTSD, GAD who was admitted to [facility] for worsening depression in context of recent abortion and multiple other stressors. Hospital course is detailed below:  MDD  R/o adjustment disorder Patient was admitted due to worsening depression and passive SI.  Patient denied SI/HI/AVH throughout hospitalization.  Patient was started on Effexor and titrated up to 150 mg.  Patient tolerated medication adjustments well and denies any side effects.  Patient was started on trazodone and hydroxyzine both of which were beneficial for sleep and anxiety respectively.  Patient has been provided outpatient follow-up with Dr. Candie Chroman for outpatient psychiatric medication management.   Total Time spent with patient: 45 minutes  Past Psychiatric History: PTSD Past Medical History:  Past Medical History:  Diagnosis Date   Anemia    @ last [facility] appt   Depression     no meds currently, was thinking of going to behavior health   H/O hiatal hernia    Hx of ulcer disease    Hx of varicella    Infection    UTI   NSVD (normal spontaneous vaginal delivery) 08/26/2012    Past Surgical History:  Procedure Laterality Date   CHOLECYSTECTOMY N/A 06/11/2017   Procedure: LAPAROSCOPIC CHOLECYSTECTOMY WITH INTRAOPERATIVE CHOLANGIOGRAM;  Surgeon: Donnie Mesa, MD;  Location: [place];  Service: General;  Laterality: N/A;   FOOT SURGERY Left    broken toe, ligament removed from back of leg   TONSILLECTOMY     As a baby   Family History:  Family History  Problem Relation Age of Onset   Epilepsy Sister    Depression Sister    Bipolar disorder Sister     Hypertension Mother    Tuberculosis Mother        as a child   Diabetes Mother        diet controlled   Depression Mother    Cancer Father        prostate   Colon cancer Paternal Grandfather    Cancer Paternal Grandfather        prostate   Breast cancer Maternal Aunt        x 3;after menopause   Diabetes Maternal Grandmother    Hypertension Maternal Grandmother    Stomach cancer Paternal Grandmother    Heart disease Neg Hx    Family Psychiatric History: unknown Social History:  Social History   Substance and Sexual Activity  Alcohol Use Not Currently   Alcohol/week: 2.0 standard drinks of alcohol   Types: 1 Glasses of wine, 1 Shots of liquor per week   Comment: occasionally     Social History   Substance and Sexual Activity  Drug Use No    Social History   Socioeconomic History   Marital status: Single    Spouse name: Not on file   Number of children: 2   Years of education: 16   Highest education level: Not on file  Occupational History   Occupation: LPN  Tobacco Use   Smoking status: Former    Packs/day: 0.50    Years: 4.00    Total pack years: 2.00    Types: Cigarettes  Quit date: 04/21/2016    Years since quitting: 5.7   Smokeless tobacco: Never  Vaping Use   Vaping Use: Never used  Substance and Sexual Activity   Alcohol use: Not Currently    Alcohol/week: 2.0 standard drinks of alcohol    Types: 1 Glasses of wine, 1 Shots of liquor per week    Comment: occasionally   Drug use: No   Sexual activity: Yes    Partners: Male  Other Topics Concern   Not on file  Social History Narrative   In relationship.   Has boyfriend and 2 children.    Son born in 2006 daughter born in 2014.  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   Exercise - 3-4 miles 3-4 times per week.    Diet - tries to eat healthy.  Originally from [place]  [place]   Social Determinants of Health   Financial Resource Strain: Not on file  Food Insecurity: Not  on file  Transportation Needs: Not on file  Physical Activity: Not on file  Stress: Not on file  Social Connections: Not on file   SDOH:  SDOH Screenings   Alcohol Screen: Not on file  Depression (PHQ2-9): High Risk (01/29/2022)   Depression (PHQ2-9)    PHQ-2 Score: 16  Financial Resource Strain: Not on file  Food Insecurity: Not on file  Housing: Not on file  Physical Activity: Not on file  Social Connections: Not on file  Stress: Not on file  Tobacco Use: Medium Risk (01/30/2022)   Patient History    Smoking Tobacco Use: Former    Smokeless Tobacco Use: Never    Passive Exposure: Not on file  Transportation Needs: Not on file    Tobacco Cessation:  N/A, patient does not currently use tobacco products  Current Medications:  Current Facility-Administered Medications  Medication Dose Route Frequency Provider Last Rate Last Admin   acetaminophen (TYLENOL) tablet 650 mg  650 mg Oral Q6H PRN Corky Sox, MD       alum & mag hydroxide-simeth (MAALOX/MYLANTA) 200-200-20 MG/5ML suspension 30 mL  30 mL Oral Q4H PRN Corky Sox, MD       hydrOXYzine (ATARAX) tablet 25 mg  25 mg Oral TID PRN Corky Sox, MD   25 mg at 01/31/22 0646   ibuprofen (ADVIL) tablet 200 mg  200 mg Oral Q8H PRN France Ravens, MD   200 mg at 01/30/22 1557   magnesium hydroxide (MILK OF MAGNESIA) suspension 30 mL  30 mL Oral Daily PRN Corky Sox, MD   30 mL at 01/30/22 2115   sulfamethoxazole-trimethoprim (BACTRIM DS) 800-160 MG per tablet 1 tablet  1 tablet Oral Q12H France Ravens, MD   1 tablet at 01/30/22 2114   traZODone (DESYREL) tablet 50 mg  50 mg Oral QHS PRN Corky Sox, MD   50 mg at 01/30/22 2115   venlafaxine XR (EFFEXOR-XR) 24 hr capsule 150 mg  150 mg Oral Q breakfast France Ravens, MD       Current Outpatient Medications  Medication Sig Dispense Refill   hydrOXYzine (ATARAX) 25 MG tablet Take 1 tablet (25 mg total) by mouth 3 (three) times daily as needed for anxiety. 30 tablet 1    ibuprofen (ADVIL) 600 MG tablet Take 1 tablet (600 mg total) by mouth every 6 (six) hours. 30 tablet 11   Probiotic Product (ALIGN) 4 MG CAPS Take 1 capsule (4 mg total) by mouth daily. 30 capsule 2   sulfamethoxazole-trimethoprim (BACTRIM  DS) 800-160 MG tablet Take 1 tablet by mouth 2 (two) times daily.     traZODone (DESYREL) 50 MG tablet Take 1 tablet (50 mg total) by mouth at bedtime as needed for sleep. 30 tablet 1   venlafaxine XR (EFFEXOR-XR) 150 MG 24 hr capsule Take 1 capsule (150 mg total) by mouth daily with breakfast. 30 capsule 1    PTA Medications: (Not in a hospital admission)       01/29/2022    7:57 AM 04/09/2020    9:13 AM  Depression screen PHQ 2/9  Decreased Interest 2 0  Down, Depressed, Hopeless 2 0  PHQ - 2 Score 4 0  Altered sleeping 3   Tired, decreased energy 2   Change in appetite 3   Feeling bad or failure about yourself  2   Trouble concentrating 1   Moving slowly or fidgety/restless 1   Suicidal thoughts 0   PHQ-9 Score 16   Difficult doing work/chores Very difficult     Flowsheet Row ED from 01/29/2022 in [facility] Admission (Discharged) from 01/22/2021 in [place] 4S Mother Baby Unit Admission (Discharged) from 01/05/2021 in [facility] 1S Maternity Assessment Unit  C-SSRS RISK CATEGORY Low Risk No Risk No Risk       Musculoskeletal  Strength & Muscle Tone: within normal limits Gait & Station: normal Patient leans: N/A  Psychiatric Specialty Exam  Presentation  General Appearance: Casual   Eye Contact:Good   Speech:Clear and Coherent; Normal Rate   Speech Volume:Normal   Handedness:No data recorded   Mood and Affect  Mood:Euthymic   Affect:Appropriate; Congruent; Tearful    Thought Process  Thought Processes:Coherent; Goal Directed; Linear   Descriptions of Associations:Intact   Orientation:Full (Time, Place and Person)   Thought Content:Logical      Hallucinations:Hallucinations:  None   Ideas of Reference:None   Suicidal Thoughts:Suicidal Thoughts: No   Homicidal Thoughts:Homicidal Thoughts: No    Sensorium  Memory:Immediate Good; Recent Good; Remote Good   Judgment:Good   Insight:Good    Executive Functions  Concentration:Good   Attention Span:Good   Recall:Good   Fund of Knowledge:Good   Language:Good    Psychomotor Activity  Psychomotor Activity:Psychomotor Activity: Normal   Assets  Assets:Communication Skills; Desire for Improvement; Financial Resources/Insurance; Housing; Intimacy; Leisure Time; Physical Health; Resilience; Social Support; Talents/Skills; Transportation; Vocational/Educational    Sleep  Sleep:Sleep: Good      Physical Exam   Review of Systems  Respiratory:  Negative for shortness of breath.   Cardiovascular:  Negative for chest pain.  Gastrointestinal:  Negative for abdominal pain, constipation, diarrhea, heartburn, nausea and vomiting.  Neurological:  Negative for headaches.   Blood pressure 118/65, pulse 86, temperature 98.5 F (36.9 C), temperature source Tympanic, resp. rate 18, SpO2 99 %, unknown if currently breastfeeding. There is no height or weight on file to calculate BMI.  Demographic Factors:  NA  Loss Factors: NA  Historical Factors: NA  Risk Reduction Factors:   Responsible for children under 62 years of age, Sense of responsibility to family, Employed, Living with another person, especially a relative, Positive social support, Positive therapeutic relationship, and Positive coping skills or problem solving skills  Continued Clinical Symptoms:  Severe Anxiety and/or Agitation Depression:   Impulsivity  Cognitive Features That Contribute To Risk:  None    Suicide Risk:  Mild:  Suicidal ideation of limited frequency, intensity, duration, and specificity.  There are no identifiable plans, no associated intent, mild dysphoria and related symptoms, good  self-control (both  objective and subjective assessment), few other risk factors, and identifiable protective factors, including available and accessible social support.  Plan Of Care/Follow-up recommendations:   Follow-up recommendations:   Activity:  as tolerated Diet:  heart healthy   Comments:  Prescriptions were given at discharge.  Patient is agreeable with the discharge plan.  Patient was given an opportunity to ask questions.  Patient appears to feel comfortable with discharge and denies any current suicidal or homicidal thoughts.    Patient is instructed prior to discharge to: Take all medications as prescribed by mental healthcare provider. Report any adverse effects and or reactions from the medicines to outpatient provider promptly. In the event of worsening symptoms, patient is instructed to call the crisis hotline, 911 and or go to the nearest ED for appropriate evaluation and treatment of symptoms. Patient is to follow-up with primary care provider for other medical issues, concerns and or health care needs.   Park Pope, MD 01/31/2022, 7:35 AM

## 2022-01-31 NOTE — Progress Notes (Signed)
Patient hung up the phone and went to her room.  Visibly upset and continuing to cry.  Words of comfort/encouragement offered.

## 2022-01-31 NOTE — Progress Notes (Signed)
Patient up early and on the phone at 0630 hours.  She was visibly upset and crying.  Patient was offered her PRN Vistaril and accepted.  Patient continues her conversation on the phone.

## 2022-01-31 NOTE — ED Notes (Signed)
Pt was sitting in lounge area upon assessment. Pt denies SI/HI/AVH. Pt reports, "I feel better. I got some rest and I'm thinking clearer and ready to challenge the world (smiling). Pt spoke with fiance on phone. No acute distress noted. Writer reviewed AVS instructions with f/u appts scheduled. Pt received sample medications upon discharge. Pt verbalized understanding and agreement of discharge instructions to include f/u care for medication mgmt, medications and place to pick up RX and safety plan. Pt received all belongings back from locker #19 intact. Pt escorted to lobby via staff. Pt proceeded to parking lot where her car was placed. Safety maintained.

## 2022-01-31 NOTE — Progress Notes (Signed)
Pt is in her bed in her room, appears to be sleeping, breathing is even and unlabored.

## 2022-02-04 DIAGNOSIS — F411 Generalized anxiety disorder: Secondary | ICD-10-CM | POA: Diagnosis not present

## 2022-02-05 ENCOUNTER — Encounter: Payer: Self-pay | Admitting: Internal Medicine

## 2022-02-05 DIAGNOSIS — H5712 Ocular pain, left eye: Secondary | ICD-10-CM | POA: Diagnosis not present

## 2022-03-05 DIAGNOSIS — F411 Generalized anxiety disorder: Secondary | ICD-10-CM | POA: Diagnosis not present

## 2022-03-11 ENCOUNTER — Encounter: Payer: Self-pay | Admitting: Internal Medicine

## 2022-03-12 ENCOUNTER — Ambulatory Visit (HOSPITAL_COMMUNITY): Payer: BC Managed Care – PPO | Admitting: Student in an Organized Health Care Education/Training Program

## 2022-03-18 DIAGNOSIS — F411 Generalized anxiety disorder: Secondary | ICD-10-CM | POA: Diagnosis not present

## 2022-04-21 ENCOUNTER — Other Ambulatory Visit: Payer: Self-pay | Admitting: Obstetrics and Gynecology

## 2022-04-30 ENCOUNTER — Encounter: Payer: Self-pay | Admitting: Obstetrics and Gynecology

## 2022-04-30 ENCOUNTER — Ambulatory Visit: Payer: BC Managed Care – PPO | Admitting: Obstetrics and Gynecology

## 2022-04-30 VITALS — BP 138/86 | HR 82 | Ht 61.5 in | Wt 240.0 lb

## 2022-04-30 DIAGNOSIS — N3941 Urge incontinence: Secondary | ICD-10-CM | POA: Diagnosis not present

## 2022-04-30 DIAGNOSIS — N393 Stress incontinence (female) (male): Secondary | ICD-10-CM

## 2022-04-30 DIAGNOSIS — R350 Frequency of micturition: Secondary | ICD-10-CM | POA: Diagnosis not present

## 2022-04-30 DIAGNOSIS — R319 Hematuria, unspecified: Secondary | ICD-10-CM

## 2022-04-30 LAB — POCT URINALYSIS DIPSTICK
Bilirubin, UA: NEGATIVE
Glucose, UA: NEGATIVE
Ketones, UA: NEGATIVE
Leukocytes, UA: NEGATIVE
Nitrite, UA: NEGATIVE
Protein, UA: NEGATIVE
Spec Grav, UA: >=1.03 — AB (ref 1.010–1.025)
Urobilinogen, UA: 0.2 E.U./dL
pH, UA: 5.5 (ref 5.0–8.0)

## 2022-04-30 NOTE — Progress Notes (Unsigned)
[facility] New Patient Evaluation and Consultation  Referring Provider: Toy Baker, DO PCP: Jac Canavan, PA-C Date of Service: 04/30/2022  SUBJECTIVE Chief Complaint: New Patient (Initial Visit) Mary Gibbs is a 36 y.o. female here for a consult for prolapse and wants her tubes tied in the same surgery.)  History of Present Illness: Mary Gibbs is a 36 y.o. Black or African-American female seen in consultation at the request of Mary. Conni Elliot for evaluation of incontinence.    Review of records significant for: Long history of leakage with cough, laugh or activity. Has worsened since the birth of her last child. She is planning a tubal ligation with Mary Conni Elliot.   Urinary Symptoms: Leaks urine with cough/ sneeze, laughing, exercise, lifting, going from sitting to standing, with a full bladder, and while asleep. Has large leakage with stress. Does not really have much urgency. Has had about 3 episodes where she has wet the bed.  Leaks a few time(s) per day- worse with allergy season Pad use: liners or adult diapers  She is bothered by her UI symptoms.  Day time voids 8.  Nocturia: 1 times per night to void. Voiding dysfunction: she empties her bladder well.  does not use a catheter to empty bladder.  When urinating, she feels she has no difficulties  UTIs:  0  UTI's in the last year.   Denies history of blood in urine and kidney or bladder stones  Pelvic Organ Prolapse Symptoms:                  She Denies a feeling of a bulge the vaginal area.   Bowel Symptom: Bowel movements: 1 time(s) per day Stool consistency: soft  Straining: no.  Splinting: no.  Incomplete evacuation: no.  She Denies accidental bowel leakage / fecal incontinence Bowel regimen: diet, fiber, and stool softener  Sexual Function Sexually active: yes.  Pain with sex: No  Pelvic Pain Denies pelvic pain   Past Medical History:  Past Medical History:  Diagnosis Date   Anemia     @ last [facility] appt   Depression     no meds currently, was thinking of going to behavior health   H/O hiatal hernia    Hx of ulcer disease    Hx of varicella    Infection    UTI   NSVD (normal spontaneous vaginal delivery) 08/26/2012     Past Surgical History:   Past Surgical History:  Procedure Laterality Date   CHOLECYSTECTOMY N/A 06/11/2017   Procedure: LAPAROSCOPIC CHOLECYSTECTOMY WITH INTRAOPERATIVE CHOLANGIOGRAM;  Surgeon: Manus Rudd, MD;  Location: [facility] OR;  Service: General;  Laterality: N/A;   FOOT SURGERY Left    broken toe, ligament removed from back of leg   TONSILLECTOMY     As a baby     Past OB/GYN History: OB History  Gravida Para Term Preterm AB Living  5 3 3   2 3   SAB IAB Ectopic Multiple Live Births    2     3    # Outcome Date GA Lbr Len/2nd Weight Sex Delivery Anes PTL Lv  5 Term 01/23/21 [redacted]w[redacted]d         4 Term 08/26/12 [redacted]w[redacted]d 1243:51 / 00:19  F Vag-Spont EPI  LIV  3 IAB 2007 [redacted]w[redacted]d            Birth Comments: No complications  2 Term 05/31/05 [redacted]w[redacted]d 12:00 7 lb (3.175 kg) M Vag-Spont EPI  LIV  Birth Comments: SROM;no complications  1 IAB             Patient's last menstrual period was 04/16/2022. Any history of abnormal pap smears: no.   Medications: She has a current medication list which includes the following prescription(s): ibuprofen, align, sulfamethoxazole-trimethoprim, and venlafaxine xr.   Allergies: Patient has No Known Allergies.   Social History:  Social History   Tobacco Use   Smoking status: Former    Packs/day: 0.50    Years: 4.00    Total pack years: 2.00    Types: Cigarettes    Quit date: 04/21/2016    Years since quitting: 6.0   Smokeless tobacco: Never  Vaping Use   Vaping Use: Never used  Substance Use Topics   Alcohol use: Not Currently    Alcohol/week: 2.0 standard drinks of alcohol    Types: 1 Glasses of wine, 1 Shots of liquor per week    Comment: occasionally   Drug use: No    Relationship status: long-term  partner She is employed as an Public house manager. Regular exercise: Yes:   History of abuse: No  Family History:   Family History  Problem Relation Age of Onset   Epilepsy Sister    Depression Sister    Bipolar disorder Sister    Hypertension Mother    Tuberculosis Mother        as a child   Diabetes Mother        diet controlled   Depression Mother    Cancer Father        prostate   Colon cancer Paternal Grandfather    Cancer Paternal Grandfather        prostate   Breast cancer Maternal Aunt        x 3;after menopause   Diabetes Maternal Grandmother    Hypertension Maternal Grandmother    Stomach cancer Paternal Grandmother    Heart disease Neg Hx      Review of Systems: Review of Systems  Constitutional:  Negative for fever, malaise/fatigue and weight loss.  Respiratory:  Negative for cough, shortness of breath and wheezing.   Cardiovascular:  Negative for chest pain, palpitations and leg swelling.  Gastrointestinal:  Negative for abdominal pain and blood in stool.  Genitourinary:  Negative for dysuria.  Musculoskeletal:  Negative for myalgias.  Skin:  Negative for rash.  Neurological:  Negative for dizziness and headaches.  Endo/Heme/Allergies:  Does not bruise/bleed easily.  Psychiatric/Behavioral:  Positive for depression. The patient is nervous/anxious.      OBJECTIVE Physical Exam: Vitals:   04/30/22 0953  BP: 138/86  Pulse: 82  Weight: 240 lb (108.9 kg)  Height: 5' 1.5" (1.562 m)    Physical Exam Constitutional:      General: She is not in acute distress. Pulmonary:     Effort: Pulmonary effort is normal.  Abdominal:     General: There is no distension.     Palpations: Abdomen is soft.     Tenderness: There is no abdominal tenderness. There is no rebound.  Musculoskeletal:        General: No swelling. Normal range of motion.  Skin:    General: Skin is warm and dry.     Findings: No rash.  Neurological:     Mental Status: She is alert and oriented to  person, place, and time.  Psychiatric:        Mood and Affect: Mood normal.        Behavior: Behavior normal.  GU / Detailed Urogynecologic Evaluation:  Pelvic Exam: Normal external female genitalia; Bartholin's and Skene's glands normal in appearance; urethral meatus normal in appearance, no urethral masses or discharge.   CST: negative   Speculum exam reveals normal vaginal mucosa without atrophy. Cervix normal appearance. Uterus normal single, nontender. Adnexa no mass, fullness, tenderness.    Pelvic floor strength III/V  Pelvic floor musculature: Right levator non-tender, Right obturator non-tender, Left levator non-tender, Left obturator non-tender  POP-Q:   POP-Q  -3                                            Aa   -3                                           Ba  -6.5                                              C   4.5                                            Gh  3                                            Pb  8                                            tvl   -2                                            Ap  -2                                            Bp  -8                                              D      Rectal Exam:  Normal external rectum  Post-Void Residual (PVR) by Bladder Scan: In order to evaluate bladder emptying, we discussed obtaining a postvoid residual and she agreed to this procedure.  Procedure: The ultrasound unit was placed on the patient's abdomen in the suprapubic region after the patient had voided. A PVR of 13 ml was obtained by bladder scan.  Laboratory Results: Poc urine: small blood   ASSESSMENT AND PLAN Mary Gibbs is a 36 y.o. with:  1. SUI (stress urinary incontinence, female)   2. Urge incontinence  3. Hematuria, unspecified type    SUI - For treatment of stress urinary incontinence,  non-surgical options include expectant management, weight loss, physical therapy, as well as a pessary.  Surgical options  include a midurethral sling, Burch urethropexy, and transurethral injection of a bulking agent. - She is interested in a sling. She would like to have this done at the same time as her tubal ligation. Will coordinate with Mary Gibbs.  - Needs simple CMG to demonstrate leakage.   2. Urge incontinence - rare, does not feel she needs treatment for this at this time.   3. Blood in urine - small blood present, will send for UA micro and culture.   Return for simple CMG   Marguerita BeardsMichelle N Maxyne Derocher, MD

## 2022-05-07 ENCOUNTER — Encounter (INDEPENDENT_AMBULATORY_CARE_PROVIDER_SITE_OTHER): Payer: BC Managed Care – PPO | Admitting: Family Medicine

## 2022-05-07 ENCOUNTER — Ambulatory Visit (HOSPITAL_BASED_OUTPATIENT_CLINIC_OR_DEPARTMENT_OTHER): Admit: 2022-05-07 | Payer: BC Managed Care – PPO | Admitting: Obstetrics and Gynecology

## 2022-05-07 ENCOUNTER — Encounter (HOSPITAL_BASED_OUTPATIENT_CLINIC_OR_DEPARTMENT_OTHER): Payer: Self-pay

## 2022-05-07 SURGERY — LIGATION, FALLOPIAN TUBE, LAPAROSCOPIC
Anesthesia: General | Laterality: Bilateral

## 2022-05-08 ENCOUNTER — Encounter: Payer: Self-pay | Admitting: Medical

## 2022-05-08 ENCOUNTER — Ambulatory Visit: Payer: BC Managed Care – PPO | Admitting: Medical

## 2022-05-08 VITALS — BP 108/70 | HR 85 | Temp 98.2°F | Resp 14 | Ht 61.5 in | Wt 238.4 lb

## 2022-05-08 DIAGNOSIS — Z139 Encounter for screening, unspecified: Secondary | ICD-10-CM

## 2022-05-08 DIAGNOSIS — Z0000 Encounter for general adult medical examination without abnormal findings: Secondary | ICD-10-CM | POA: Diagnosis not present

## 2022-05-08 DIAGNOSIS — Z6841 Body Mass Index (BMI) 40.0 and over, adult: Secondary | ICD-10-CM | POA: Diagnosis not present

## 2022-05-08 DIAGNOSIS — Z7185 Encounter for immunization safety counseling: Secondary | ICD-10-CM

## 2022-05-08 DIAGNOSIS — F411 Generalized anxiety disorder: Secondary | ICD-10-CM | POA: Diagnosis not present

## 2022-05-08 DIAGNOSIS — F332 Major depressive disorder, recurrent severe without psychotic features: Secondary | ICD-10-CM

## 2022-05-08 MED ORDER — WEGOVY 0.25 MG/0.5ML ~~LOC~~ SOAJ: 0.2500 mg | SUBCUTANEOUS | 0 refills | Status: DC

## 2022-05-08 MED ORDER — WEGOVY 0.5 MG/0.5ML ~~LOC~~ SOAJ: 0.5000 mg | SUBCUTANEOUS | 0 refills | Status: DC

## 2022-05-08 NOTE — Progress Notes (Signed)
Subjective:  Mary Gibbs is a 37 y.o. female who presents for Chief Complaint  Patient presents with   Annual Exam    Here today for annual physical. She is fasting.      Here for well visit.  Medical team: Sees a dentist, Dr. Conni Elliot, OB/GYN Patient Care Team: Donley Harland, Kermit Balo, PA-C as PCP - General (Family Medicine)  Concerns: Here for physical.  Of note she was seen in August in the emergency department had a whole panel labs then.  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  She is not sure if she had an updated Tdap last year after she had a baby in August 2022  Mood-wise she has not done well with pregnancy.  Her hormones were all over the place back in August.  She had an episode of mental health problems a few months ago when she found that she is pregnant again.  She did have an abortion.  She lives at home with her 3 children and her fianc.  She is back to work full-time.  Exercise is sometimes difficult with timing given obligations of having 3 children and 1 child under 11 years old.  She enjoys exercise when she can do it.  Past Medical History:  Diagnosis Date   Anemia    @ last [facility] appt   Depression     no meds currently, was thinking of going to behavior health   H/O hiatal hernia    Hx of ulcer disease    Hx of varicella    Infection    UTI   NSVD (normal spontaneous vaginal delivery) 08/26/2012   Current Outpatient Medications on File Prior to Visit  Medication Sig Dispense Refill   BIOTIN PO Take 1 capsule by mouth daily.     BLACK CURRANT SEED OIL PO Take 2 capsules by mouth daily. Green tea supplement     ibuprofen (ADVIL) 600 MG tablet Take 1 tablet (600 mg total) by mouth every 6 (six) hours. 30 tablet 11   Probiotic Product (ALIGN) 4 MG CAPS Take 1 capsule (4 mg total) by mouth daily. 30 capsule 2   venlafaxine XR (EFFEXOR-XR) 150 MG 24 hr capsule Take 1 capsule (150 mg total) by mouth daily with  breakfast. (Patient not taking: Reported on 05/08/2022) 30 capsule 1   No current facility-administered medications on file prior to visit.   Past Surgical History:  Procedure Laterality Date   CHOLECYSTECTOMY N/A 06/11/2017   Procedure: LAPAROSCOPIC CHOLECYSTECTOMY WITH INTRAOPERATIVE CHOLANGIOGRAM;  Surgeon: Manus Rudd, MD;  Location: [facility] OR;  Service: General;  Laterality: N/A;   FOOT SURGERY Left    broken toe, ligament removed from back of leg   TONSILLECTOMY     As a baby    The following portions of the patient's history were reviewed and updated as appropriate: allergies, current medications, past family history, past medical history, past social history, past surgical history and problem list.  ROS Otherwise as in subjective above   Objective: BP 108/70   Pulse 85   Ht 5' 1.5" (1.562 m)   Wt 238 lb 6.4 oz (108.1 kg)   LMP 05/08/2022   SpO2 98% Comment: room air  BMI 44.32 kg/m   General appearance: alert, no distress, well developed, well nourished HEENT: normocephalic, sclerae anicteric, conjunctiva pink and moist, TMs pearly, nares patent, no discharge or erythema, pharynx normal Oral cavity: MMM, no lesions Neck: supple,  no lymphadenopathy, no thyromegaly, no masses, no bruits Heart: RRR, normal S1, S2, no murmurs Lungs: CTA bilaterally, no wheezes, rhonchi, or rales Abdomen: +bs, soft, non tender, non distended, no masses, no hepatomegaly, no splenomegaly Pulses: 2+ radial pulses, 2+ pedal pulses, normal cap refill Ext: no edema Neuro: CN II to XII intact, nonfocal exam Psych: Pleasant, good eye contact, answers questions appropriately Breast and gyn deferred to gynecology    Assessment: Encounter Diagnoses  Name Primary?   Encounter for health maintenance examination in adult Yes   BMI 40.0-44.9, adult (HCC)    Screening for condition    Vaccine counseling    Severe episode of recurrent major depressive disorder, without psychotic features (HCC)       Plan: This visit was a preventative care visit, also known as wellness visit or routine physical.   Topics typically include healthy lifestyle, diet, exercise, preventative care, vaccinations, sick and well care, proper use of emergency dept and after hours care, as well as other concerns.    I reviewed labs she had recently and August including lipid and hemoglobin A1c diabetes marker.  No major concerns with those blood tests  Recommendations: Continue to return yearly for your annual wellness and preventative care visits.  This gives [place] a chance to discuss healthy lifestyle, exercise, vaccinations, review your chart record, and perform screenings where appropriate.  I recommend you see your eye doctor yearly for routine vision care.  I recommend you see your dentist yearly for routine dental care including hygiene visits twice yearly.   Vaccination recommendations were reviewed Immunization History  Administered Date(s) Administered   Influenza Split 06/23/2012   Influenza, Seasonal, Injecte, Preservative Fre 06/23/2012   Influenza,inj,Quad PF,6+ Mos 03/16/2022   Influenza-Unspecified 06/23/2012   PFIZER(Purple Top)SARS-COV-2 Vaccination 04/02/2020, 04/23/2020   Tdap 08/27/2012   Titer today for varicella  Will reach out to gynecology to get a copy of last vaccines in the past year or 2   Screening for cancer: Colon cancer screening: Age 66  Breast cancer screening: You should perform a self breast exam monthly.   We reviewed recommendations for regular mammograms and breast cancer screening.  Cervical cancer screening: We reviewed recommendations for pap smear screening. We will request most recent Pap smear from gynecology  Skin cancer screening: Check your skin regularly for new changes, growing lesions, or other lesions of concern Come in for evaluation if you have skin lesions of concern.  Lung cancer screening: If you have a greater than 20 pack year  history of tobacco use, then you may qualify for lung cancer screening with a chest CT scan.   Please call your insurance company to inquire about coverage for this test.  We currently don't have screenings for other cancers besides breast, cervical, colon, and lung cancers.  If you have a strong family history of cancer or have other cancer screening concerns, please let me know.    Bone health: Get at least 150 minutes of aerobic exercise weekly Get weight bearing exercise at least once weekly Bone density test:  A bone density test is an imaging test that uses a type of X-ray to measure the amount of calcium and other minerals in your bones. The test may be used to diagnose or screen you for a condition that causes weak or thin bones (osteoporosis), predict your risk for a broken bone (fracture), or determine how well your osteoporosis treatment is working. The bone density test is recommended for females 65 and older, or  females or males <65 if certain risk factors such as thyroid disease, long term use of steroids such as for asthma or rheumatological issues, vitamin D deficiency, estrogen deficiency, family history of osteoporosis, self or family history of fragility fracture in first degree relative.    Heart health: Get at least 150 minutes of aerobic exercise weekly Limit alcohol It is important to maintain a healthy blood pressure and healthy cholesterol numbers  Heart disease screening: Screening for heart disease includes screening for blood pressure, fasting lipids, glucose/diabetes screening, BMI height to weight ratio, reviewed of smoking status, physical activity, and diet.    Goals include blood pressure 120/80 or less, maintaining a healthy lipid/cholesterol profile, preventing diabetes or keeping diabetes numbers under good control, not smoking or using tobacco products, exercising most days per week or at least 150 minutes per week of exercise, and eating healthy variety of  fruits and vegetables, healthy oils, and avoiding unhealthy food choices like fried food, fast food, high sugar and high cholesterol foods.    Other tests may possibly include EKG test, CT coronary calcium score, echocardiogram, exercise treadmill stress test.     Medical care options: I recommend you continue to seek care here first for routine care.  We try really hard to have available appointments Monday through Friday daytime hours for sick visits, acute visits, and physicals.  Urgent care should be used for after hours and weekends for significant issues that cannot wait till the next day.  The emergency department should be used for significant potentially life-threatening emergencies.  The emergency department is expensive, can often have long wait times for less significant concerns, so try to utilize primary care, urgent care, or telemedicine when possible to avoid unnecessary trips to the emergency department.  Virtual visits and telemedicine have been introduced since the pandemic started in 2020, and can be convenient ways to receive medical care.  We offer virtual appointments as well to assist you in a variety of options to seek medical care.   Specific concerns: Obesity-continue efforts to lose with healthy diet and exercise.  Begin trial of Wegovy if this is covered by insurance.  We discussed risk and benefits and proper use of medication.  Depression-continue routine follow-up with counseling as she is doing.  I recommend she consider getting back on birth control until she can get tubal ligation coming up soon   Mary Gibbs was seen today for annual exam.  Diagnoses and all orders for this visit:  Encounter for health maintenance examination in adult -     Varicella zoster antibody, IgG  BMI 40.0-44.9, adult (HCC)  Screening for condition -     Varicella zoster antibody, IgG  Vaccine counseling  Severe episode of recurrent major depressive disorder, without psychotic  features (HCC)  Other orders -     Semaglutide-Weight Management (WEGOVY) 0.25 MG/0.5ML SOAJ; Inject 0.25 mg into the skin once a week. -     Semaglutide-Weight Management (WEGOVY) 0.5 MG/0.5ML SOAJ; Inject 0.5 mg into the skin once a week.    Follow up: 6-8 weeks

## 2022-05-09 ENCOUNTER — Ambulatory Visit (INDEPENDENT_AMBULATORY_CARE_PROVIDER_SITE_OTHER): Payer: BC Managed Care – PPO | Admitting: Obstetrics and Gynecology

## 2022-05-09 ENCOUNTER — Encounter: Payer: Self-pay | Admitting: Obstetrics and Gynecology

## 2022-05-09 VITALS — BP 125/80 | HR 86 | Ht 61.5 in | Wt 238.0 lb

## 2022-05-09 DIAGNOSIS — N3941 Urge incontinence: Secondary | ICD-10-CM

## 2022-05-09 DIAGNOSIS — N393 Stress incontinence (female) (male): Secondary | ICD-10-CM

## 2022-05-09 LAB — VARICELLA ZOSTER ANTIBODY, IGG: Varicella zoster IgG: 835 index (ref >165)

## 2022-05-09 NOTE — Progress Notes (Signed)
Verbal consent was obtained to perform simple CMG procedure:   Urethra was prepped with betadine and a 36F catheter was placed and bladder was drained completely. The bladder was then backfilled with sterile water by gravity.  First sensation: 90 First Desire: 180 Strong Desire: 410 Capacity: 675 Cough stress test was negative. Valsalva stress test was positive while standing.  She was was allowed to void on her own.   Interpretation: CMG showed normal sensation, and increased cystometric capacity. Findings positive for stress incontinence, negative for detrusor overactivity.   Patient plans to undergo sling surgery.

## 2022-05-09 NOTE — Progress Notes (Signed)
Results sent through MyChart

## 2022-05-09 NOTE — Patient Instructions (Addendum)
Taking Care of Yourself after CMG  Drink plenty of water for a day or two following your procedure. Try to have about 8 ounces (one cup) at a time, and do this 6 times or more per day unless you have fluid restrictitons AVOID irritative beverages such as coffee, tea, soda, alcoholic or citrus drinks for a day or two, as this may cause burning with urination. You may experience some discomfort or a burning sensation with urination after having this procedure. You can use over the counter Azo or pyridium to help with burning and follow the instructions on the packaging. If it does not improve within 1-2 days, or other symptoms appear (fever, chills, or difficulty urinating) call the office to speak to a nurse.  You may return to normal daily activities such as work, school, driving, exercising and housework on the day of the procedure. If your doctor gave you a prescription, take it as ordered.    We will call you for surgery scheduling, it may be after the new year before we get approval to schedule.

## 2022-05-13 ENCOUNTER — Encounter: Payer: Self-pay | Admitting: *Deleted

## 2022-05-13 ENCOUNTER — Telehealth: Payer: Self-pay | Admitting: Medical

## 2022-05-13 ENCOUNTER — Other Ambulatory Visit: Payer: Self-pay | Admitting: *Deleted

## 2022-05-13 DIAGNOSIS — Z0184 Encounter for antibody response examination: Secondary | ICD-10-CM

## 2022-05-13 DIAGNOSIS — Z111 Encounter for screening for respiratory tuberculosis: Secondary | ICD-10-CM

## 2022-05-13 NOTE — Telephone Encounter (Signed)
Pt came in yesterday and dropped of a form to be completed. Pt had cpe with Vincenza Hews on 05/13/2022.  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 Sending back to CMA per [facility] protocol. Please call pt at [phone] when completed.

## 2022-05-13 NOTE — Telephone Encounter (Signed)
Received records from [facility]  obgyn .

## 2022-05-13 NOTE — Telephone Encounter (Signed)
Please send back to me once completed.

## 2022-05-13 NOTE — Addendum Note (Signed)
Addended by: Salina April on: 05/13/2022 11:22 AM   Modules accepted: Orders

## 2022-05-13 NOTE — Telephone Encounter (Signed)
Called over to GYN office, they have no record of MMR vaccine or titer. I do not have the form-if you can give to me I would be glad to fill out what I can or I can hold her paperwork until next week when MMR titer and Quant Gold results come back. I scheduled her to have these done-she is coming in Thursday and I placed the orders. This should be all we need to complete these forms for patient.

## 2022-05-15 ENCOUNTER — Other Ambulatory Visit: Payer: BC Managed Care – PPO

## 2022-05-19 ENCOUNTER — Other Ambulatory Visit: Payer: BC Managed Care – PPO

## 2022-05-19 DIAGNOSIS — Z0184 Encounter for antibody response examination: Secondary | ICD-10-CM | POA: Diagnosis not present

## 2022-05-19 DIAGNOSIS — Z111 Encounter for screening for respiratory tuberculosis: Secondary | ICD-10-CM | POA: Diagnosis not present

## 2022-05-20 NOTE — Progress Notes (Signed)
Still pending tuberculosis screening.  She is not immune to measles.  Have her come in for a booster MMR vaccine

## 2022-05-21 ENCOUNTER — Telehealth: Payer: Self-pay | Admitting: Medical

## 2022-05-21 ENCOUNTER — Other Ambulatory Visit (INDEPENDENT_AMBULATORY_CARE_PROVIDER_SITE_OTHER): Payer: BC Managed Care – PPO

## 2022-05-21 DIAGNOSIS — Z23 Encounter for immunization: Secondary | ICD-10-CM | POA: Diagnosis not present

## 2022-05-21 NOTE — Telephone Encounter (Signed)
Pt came in and wanted to change pharmacy for wegovy the PA has already been sent over from [facility] but they to not have in stock. Pt was able to find at [facility] in [place]. Pt can be reached at [phone].

## 2022-05-21 NOTE — Telephone Encounter (Signed)
Will check status tomorrow 

## 2022-05-22 DIAGNOSIS — F411 Generalized anxiety disorder: Secondary | ICD-10-CM | POA: Diagnosis not present

## 2022-05-22 LAB — QUANTIFERON-TB GOLD PLUS
QuantiFERON Mitogen Value: 8.64 IU/mL
QuantiFERON Nil Value: 0 IU/mL
QuantiFERON TB1 Ag Value: 0 IU/mL
QuantiFERON TB2 Ag Value: 0 IU/mL
QuantiFERON-TB Gold Plus: NEGATIVE

## 2022-05-22 LAB — MEASLES/MUMPS/RUBELLA IMMUNITY
MUMPS ABS, IGG: 11 AU/mL (ref >10.9)
RUBEOLA AB, IGG: <13.5 AU/mL — ABNORMAL LOW (ref >16.4)
Rubella Antibodies, IGG: 2.05 index (ref >0.99)

## 2022-05-22 MED ORDER — WEGOVY 1 MG/0.5ML ~~LOC~~ SOAJ
1.0000 mg | SUBCUTANEOUS | 0 refills | Status: DC
  Filled 2022-07-09: qty 2, 28d supply, fill #0

## 2022-05-22 MED ORDER — WEGOVY 0.25 MG/0.5ML ~~LOC~~ SOAJ: 0.2500 mg | SUBCUTANEOUS | 0 refills | Status: DC

## 2022-05-22 NOTE — Telephone Encounter (Signed)
Sent P.A wegovy through cover my meds. Waiting on response

## 2022-05-22 NOTE — Telephone Encounter (Signed)
This has been approved and I have sent in approval to [facility]

## 2022-05-22 NOTE — Progress Notes (Signed)
Negative for tuberculosis See forms, complete and/or add copies of labs where applicable She needs to come in for MMR booster, and once this is done, we can complete forms completely

## 2022-06-06 NOTE — Telephone Encounter (Signed)
done

## 2022-06-11 DIAGNOSIS — F411 Generalized anxiety disorder: Secondary | ICD-10-CM | POA: Diagnosis not present

## 2022-06-25 DIAGNOSIS — F411 Generalized anxiety disorder: Secondary | ICD-10-CM | POA: Diagnosis not present

## 2022-07-03 ENCOUNTER — Other Ambulatory Visit: Payer: Self-pay | Admitting: Obstetrics and Gynecology

## 2022-07-08 ENCOUNTER — Ambulatory Visit (INDEPENDENT_AMBULATORY_CARE_PROVIDER_SITE_OTHER): Payer: BC Managed Care – PPO | Admitting: Medical

## 2022-07-08 ENCOUNTER — Other Ambulatory Visit (HOSPITAL_COMMUNITY): Payer: Self-pay

## 2022-07-08 VITALS — BP 110/80 | HR 88 | Wt 237.6 lb

## 2022-07-08 DIAGNOSIS — K5900 Constipation, unspecified: Secondary | ICD-10-CM

## 2022-07-08 DIAGNOSIS — Z79899 Other long term (current) drug therapy: Secondary | ICD-10-CM

## 2022-07-08 DIAGNOSIS — Z6841 Body Mass Index (BMI) 40.0 and over, adult: Secondary | ICD-10-CM

## 2022-07-08 DIAGNOSIS — R110 Nausea: Secondary | ICD-10-CM | POA: Diagnosis not present

## 2022-07-08 MED ORDER — WEGOVY 1 MG/0.5ML ~~LOC~~ SOAJ: 1.0000 mg | SUBCUTANEOUS | 0 refills | Status: DC

## 2022-07-08 NOTE — Progress Notes (Signed)
Subjective:  Mary Gibbs is a 37 y.o. female who presents for Chief Complaint  Patient presents with   Follow-up    Pt wants to discuss weight loss medication.     Here for f/u on weight loss.  Started [place] recently, did 1 month at the 0.25mg  started dose.  Is on 2nd week of the 0.5mg  dose.   Has seen a decrease in appetite and cravings, but has had some nausea, some constipation.  Diet - protein drinks in morning, lots of water, eating 2 meals daily otherwise.  Exercise - does a dance class every other week, doing spin class, no weight bearing exercise.  Doing about 3 days of exercise per week.  Has used some miralax a few doses but still feels blocked up  Is on birth control and getting tubal ligation next month  No other aggravating or relieving factors.    No other c/o.  Past Medical History:  Diagnosis Date   Anemia    @ last [facility] appt   Depression     no meds currently, was thinking of going to behavior health   H/O hiatal hernia    Hx of ulcer disease    Hx of varicella    Infection    UTI   NSVD (normal spontaneous vaginal delivery) 08/26/2012   Current Outpatient Medications on File Prior to Visit  Medication Sig Dispense Refill   BIOTIN PO Take 1 capsule by mouth daily.     BLACK CURRANT SEED OIL PO Take 2 capsules by mouth daily. Green tea supplement     norgestimate-ethinyl estradiol (SPRINTEC 28) 0.25-35 MG-MCG tablet Take 1 tablet by mouth daily.     Probiotic Product (ALIGN) 4 MG CAPS Take 1 capsule (4 mg total) by mouth daily. 30 capsule 2   Semaglutide-Weight Management (WEGOVY) 0.5 MG/0.5ML SOAJ Inject 0.5 mg into the skin once a week. 2 mL 0   ibuprofen (ADVIL) 600 MG tablet Take 1 tablet (600 mg total) by mouth every 6 (six) hours. (Patient not taking: Reported on 07/08/2022) 30 tablet 11   No current facility-administered medications on file prior to visit.     The following portions of the patient's history were reviewed and updated as appropriate:  allergies, current medications, past family history, past medical history, past social history, past surgical history and problem list.  ROS Otherwise as in subjective above  Objective: BP 110/80   Pulse 88   Wt 237 lb 9.6 oz (107.8 kg)   BMI 44.17 kg/m   Wt Readings from Last 3 Encounters:  07/08/22 237 lb 9.6 oz (107.8 kg)  05/09/22 238 lb (108 kg)  05/08/22 238 lb 6.4 oz (108.1 kg)   General appearance: alert, no distress, well developed, well nourished Psych: pleasant, answers questions appropriately    Assessment: Encounter Diagnoses  Name Primary?   High risk medication use Yes   BMI 40.0-44.9, adult (HCC)    Nausea    Constipation, unspecified constipation type      Plan: We discussed her new start Wegovy weight loss medication.  She is having some side effects of nausea and constipation.  Continue efforts with good water intake and fiber intake.  We discussed acute use and chronic use of MiraLAX.  We discussed other potential remedies.  She wants to continue the current plan with [facility] and go up to 1 mg in 2 weeks.  Continue exercise but add in some weightbearing exercise.  Continue with healthy dietary changes.  Discussed the  risk and benefits and proper use of medication.  Mary Gibbs was seen today for follow-up.  Diagnoses and all orders for this visit:  High risk medication use  BMI 40.0-44.9, adult (HCC)  Nausea  Constipation, unspecified constipation type  Other orders -     Semaglutide-Weight Management (WEGOVY) 1 MG/0.5ML SOAJ; Inject 1 mg into the skin once a week.   Follow up: 34mo

## 2022-07-09 ENCOUNTER — Other Ambulatory Visit (HOSPITAL_COMMUNITY): Payer: Self-pay

## 2022-07-10 ENCOUNTER — Other Ambulatory Visit (HOSPITAL_COMMUNITY): Payer: Self-pay

## 2022-07-11 ENCOUNTER — Other Ambulatory Visit (HOSPITAL_COMMUNITY): Payer: Self-pay

## 2022-07-22 NOTE — Progress Notes (Signed)
[facility] Pre-Operative Eval  Subjective Chief Complaint: Mary Gibbs presents for a preoperative encounter.   History of Present Illness: Mary Gibbs is a 37 y.o. female who presents for preoperative visit.  She is scheduled to undergo Exam under anesthesia, Mid-urethral sling, and cystoscopy on 08/04/2022.  Her symptoms include stress urinary incontinence, and she was was found to have Stage I anterior, Stage I posterior, Stage I apical prolapse.   Simple CMG Showed: CMG showed normal sensation, and increased cystometric capacity. Findings positive for stress incontinence, negative for detrusor overactivity.    Past Medical History:  Diagnosis Date   Anemia    @ last [facility] appt   Depression     no meds currently, was thinking of going to behavior health   H/O hiatal hernia    Hx of ulcer disease    Hx of varicella    Infection    UTI   NSVD (normal spontaneous vaginal delivery) 08/26/2012     Past Surgical History:  Procedure Laterality Date   CHOLECYSTECTOMY N/A 06/11/2017   Procedure: LAPAROSCOPIC CHOLECYSTECTOMY WITH INTRAOPERATIVE CHOLANGIOGRAM;  Surgeon: Manus Rudd, MD;  Location: [facility] OR;  Service: General;  Laterality: N/A;   FOOT SURGERY Left    broken toe, ligament removed from back of leg   TONSILLECTOMY     As a baby    has No Known Allergies.   Family History  Problem Relation Age of Onset   Epilepsy Sister    Depression Sister    Bipolar disorder Sister    Hypertension Mother    Tuberculosis Mother        as a child   Diabetes Mother        diet controlled   Depression Mother    Cancer Father        prostate   Colon cancer Paternal Grandfather    Cancer Paternal Grandfather        prostate   Breast cancer Maternal Aunt        x 3;after menopause   Diabetes Maternal Grandmother    Hypertension Maternal Grandmother    Stomach cancer Paternal Grandmother    Heart disease Neg Hx     Social History   Tobacco Use   Smoking  status: Former    Packs/day: 0.50    Years: 4.00    Total pack years: 2.00    Types: Cigarettes    Quit date: 04/21/2016    Years since quitting: 6.2   Smokeless tobacco: Never  Vaping Use   Vaping Use: Never used  Substance Use Topics   Alcohol use: Not Currently    Comment: occasionally   Drug use: No     Review of Systems was negative for a full 10 system review except as noted in the History of Present Illness.   Current Outpatient Medications:    BIOTIN PO, Take 1 capsule by mouth daily., Disp: , Rfl:    BLACK CURRANT SEED OIL PO, Take 2 capsules by mouth daily. Green tea supplement, Disp: , Rfl:    ibuprofen (ADVIL) 600 MG tablet, Take 1 tablet (600 mg total) by mouth every 6 (six) hours. (Patient not taking: Reported on 07/08/2022), Disp: 30 tablet, Rfl: 11   norgestimate-ethinyl estradiol (SPRINTEC 28) 0.25-35 MG-MCG tablet, Take 1 tablet by mouth daily., Disp: , Rfl:    Probiotic Product (ALIGN) 4 MG CAPS, Take 1 capsule (4 mg total) by mouth daily., Disp: 30 capsule, Rfl: 2   Semaglutide-Weight Management (WEGOVY)  1 MG/0.5ML SOAJ, Inject 1 mg into the skin once a week., Disp: 2 mL, Rfl: 0   Semaglutide-Weight Management (WEGOVY) 1 MG/0.5ML SOAJ, Inject 1 mg into the skin once a week., Disp: 2 mL, Rfl: 0   Objective There were no vitals filed for this visit.  Gen: NAD CV: S1 S2 RRR Lungs: Clear to auscultation bilaterally Abd: soft, nontender   Previous Pelvic Exam showed: Pelvic Exam: Normal external female genitalia; Bartholin's and Skene's glands normal in appearance; urethral meatus normal in appearance, no urethral masses or discharge.    CST: negative     Speculum exam reveals normal vaginal mucosa without atrophy. Cervix normal appearance. Uterus normal single, nontender. Adnexa no mass, fullness, tenderness.     Pelvic floor strength III/V   Pelvic floor musculature: Right levator non-tender, Right obturator non-tender, Left levator non-tender, Left  obturator non-tender   POP-Q:    POP-Q   -3                                            Aa   -3                                           Ba   -6.5                                              C    4.5                                            Gh   3                                            Pb   8                                            tvl    -2                                            Ap   -2                                            Bp   -8                                              D          Rectal Exam:  Normal external rectum  Assessment/ Plan  Assessment: The patient is a 37 y.o. year old scheduled to undergo Exam under anesthesia, Mid-urethral sling, and cystoscopy. Verbal consent was obtained for these procedures.  Plan: General Surgical Consent: The patient has previously been counseled on alternative treatments, and the decision by the patient and provider was to proceed with the procedure listed above.  For all procedures, there are risks of bleeding, infection, damage to surrounding organs including but not limited to bowel, bladder, blood vessels, ureters and nerves, and need for further surgery if an injury were to occur. These risks are all low with minimally invasive surgery.   There are risks of numbness and weakness at any body site or buttock/rectal pain.  It is possible that baseline pain can be worsened by surgery, either with or without mesh. If surgery is vaginal, there is also a low risk of possible conversion to laparoscopy or open abdominal incision where indicated. Very rare risks include blood transfusion, blood clot, heart attack, pneumonia, or death.   There is also a risk of short-term postoperative urinary retention with need to use a catheter. About half of patients need to go home from surgery with a catheter, which is then later removed in the office. The risk of long-term need for a catheter is very low. There is also a  risk of worsening of overactive bladder.   Sling: The effectiveness of a midurethral vaginal mesh sling is approximately 85%, and thus, there will be times when you may leak urine after surgery, especially if your bladder is full or if you have a strong cough. There is a balance between making the sling tight enough to treat your leakage but not too tight so that you have long-term difficulty emptying your bladder. A mesh sling will not directly treat overactive bladder/urge incontinence and may worsen it.  There is an FDA safety notification on vaginal mesh procedures for prolapse but NOT mesh slings. We have extensive experience and training with mesh placement and we have close postoperative follow up to identify any potential complications from mesh. It is important to realize that this mesh is a permanent implant that cannot be easily removed. There are rare risks of mesh exposure (2-4%), pain with intercourse (0-7%), and infection (<1%). The risk of mesh exposure if more likely in a woman with risks for poor healing (prior radiation, poorly controlled diabetes, or immunocompromised). The risk of new or worsened chronic pain after mesh implant is more common in women with baseline chronic pain and/or poorly controlled anxiety or depression. Approximately 2-4% of patients will experience longer-term post-operative voiding dysfunction that may require surgical revision of the sling. We also reviewed that postoperatively, her stream may not be as strong as before surgery.    We discussed consent for blood products. Risks for blood transfusion include allergic reactions, other reactions that can affect different body organs and managed accordingly, transmission of infectious diseases such as HIV or Hepatitis. However, the blood is screened. Patient consents for blood products.  Pre-operative instructions:  She was instructed to not take Aspirin/NSAIDs x 7days prior to surgery.  Catheter use: Patient will  go home with foley if needed after post-operative voiding trial.  Post-operative instructions:  She was provided with specific post-operative instructions, including precautions and signs/symptoms for which we would recommend contacting us, in addition to daytime and after-hours contact phone numbers. This was provided on a handout.   Post-operative medications: Prescriptions for motrin, tylenol, miralax, and oxycodone were sent to her pharmacy. Discussed  using ibuprofen and tylenol on a schedule to limit use of narcotics.   Laboratory testing:  We will check labs as requested by Dr. Conni Elliot or anesthesia  Preoperative clearance:  She does not require surgical clearance.    Post-operative follow-up:  A post-operative appointment will be made for 6 weeks from the date of surgery. If she needs a post-operative nurse visit for a voiding trial, that will be set up after she leaves the hospital.    Patient will call the clinic or use MyChart should anything change or any new issues arise.   Selmer Dominion, NP

## 2022-07-23 ENCOUNTER — Encounter: Payer: Self-pay | Admitting: Obstetrics and Gynecology

## 2022-07-23 ENCOUNTER — Ambulatory Visit (INDEPENDENT_AMBULATORY_CARE_PROVIDER_SITE_OTHER): Payer: BC Managed Care – PPO | Admitting: Obstetrics and Gynecology

## 2022-07-23 VITALS — BP 115/77 | HR 84 | Wt 233.6 lb

## 2022-07-23 DIAGNOSIS — N3941 Urge incontinence: Secondary | ICD-10-CM | POA: Diagnosis not present

## 2022-07-23 DIAGNOSIS — Z01818 Encounter for other preprocedural examination: Secondary | ICD-10-CM

## 2022-07-23 DIAGNOSIS — N393 Stress incontinence (female) (male): Secondary | ICD-10-CM

## 2022-07-23 MED ORDER — POLYETHYLENE GLYCOL 3350 17 GM/SCOOP PO POWD: 17.0000 g | Freq: Every day | ORAL | 0 refills | Status: AC

## 2022-07-23 MED ORDER — IBUPROFEN 600 MG PO TABS: 600.0000 mg | ORAL_TABLET | Freq: Four times a day (QID) | ORAL | 0 refills | Status: AC | PRN

## 2022-07-23 MED ORDER — OXYCODONE HCL 5 MG PO TABS: 5.0000 mg | ORAL_TABLET | ORAL | 0 refills | Status: DC | PRN

## 2022-07-23 MED ORDER — ACETAMINOPHEN 500 MG PO TABS: 500.0000 mg | ORAL_TABLET | Freq: Four times a day (QID) | ORAL | 0 refills | Status: AC | PRN

## 2022-07-23 NOTE — H&P (Signed)
[facility] H&P  Subjective Chief Complaint: Mary Gibbs presents for a preoperative encounter.   History of Present Illness: Mary Gibbs is a 37 y.o. female who presents for preoperative visit.  She is scheduled to undergo Exam under anesthesia, Mid-urethral sling, and cystoscopy on 08/04/2022.  Her symptoms include stress urinary incontinence, and she was was found to have Stage I anterior, Stage I posterior, Stage I apical prolapse.   Simple CMG Showed: CMG showed normal sensation, and increased cystometric capacity. Findings positive for stress incontinence, negative for detrusor overactivity.    Past Medical History:  Diagnosis Date   Anemia    @ last [facility] appt   Depression     no meds currently, was thinking of going to behavior health   H/O hiatal hernia    Hx of ulcer disease    Hx of varicella    Infection    UTI   NSVD (normal spontaneous vaginal delivery) 08/26/2012     Past Surgical History:  Procedure Laterality Date   CHOLECYSTECTOMY N/A 06/11/2017   Procedure: LAPAROSCOPIC CHOLECYSTECTOMY WITH INTRAOPERATIVE CHOLANGIOGRAM;  Surgeon: Donnie Mesa, MD;  Location: [facility];  Service: General;  Laterality: N/A;   FOOT SURGERY Left    broken toe, ligament removed from back of leg   TONSILLECTOMY     As a baby    has No Known Allergies.   Family History  Problem Relation Age of Onset   Epilepsy Sister    Depression Sister    Bipolar disorder Sister    Hypertension Mother    Tuberculosis Mother        as a child   Diabetes Mother        diet controlled   Depression Mother    Cancer Father        prostate   Colon cancer Paternal Grandfather    Cancer Paternal Grandfather        prostate   Breast cancer Maternal Aunt        x 3;after menopause   Diabetes Maternal Grandmother    Hypertension Maternal Grandmother    Stomach cancer Paternal Grandmother    Heart disease Neg Hx     Social History   Tobacco Use   Smoking status: Former     Packs/day: 0.50    Years: 4.00    Total pack years: 2.00    Types: Cigarettes    Quit date: 04/21/2016    Years since quitting: 6.2   Smokeless tobacco: Never  Vaping Use   Vaping Use: Never used  Substance Use Topics   Alcohol use: Not Currently    Comment: occasionally   Drug use: No     Review of Systems was negative for a full 10 system review except as noted in the History of Present Illness.  No current facility-administered medications for this encounter.  Current Outpatient Medications:    acetaminophen (TYLENOL) 500 MG tablet, Take 1 tablet (500 mg total) by mouth every 6 (six) hours as needed (pain)., Disp: 30 tablet, Rfl: 0   BIOTIN PO, Take 1 capsule by mouth daily., Disp: , Rfl:    BLACK CURRANT SEED OIL PO, Take 2 capsules by mouth daily. Green tea supplement, Disp: , Rfl:    ibuprofen (ADVIL) 600 MG tablet, Take 1 tablet (600 mg total) by mouth every 6 (six) hours as needed., Disp: 30 tablet, Rfl: 0   norgestimate-ethinyl estradiol (SPRINTEC 28) 0.25-35 MG-MCG tablet, Take 1 tablet by mouth daily., Disp: ,  Rfl:    oxyCODONE (OXY IR/ROXICODONE) 5 MG immediate release tablet, Take 1 tablet (5 mg total) by mouth every 4 (four) hours as needed for severe pain., Disp: 10 tablet, Rfl: 0   polyethylene glycol powder (GLYCOLAX/MIRALAX) 17 GM/SCOOP powder, Take 17 g by mouth daily. Drink 17g (1 scoop) dissolved in water per day., Disp: 255 g, Rfl: 0   Probiotic Product (ALIGN) 4 MG CAPS, Take 1 capsule (4 mg total) by mouth daily., Disp: 30 capsule, Rfl: 2   Semaglutide-Weight Management (WEGOVY) 1 MG/0.5ML SOAJ, Inject 1 mg into the skin once a week., Disp: 2 mL, Rfl: 0   Semaglutide-Weight Management (WEGOVY) 1 MG/0.5ML SOAJ, Inject 1 mg into the skin once a week., Disp: 2 mL, Rfl: 0   Objective There were no vitals filed for this visit.  Gen: NAD CV: S1 S2 RRR Lungs: Clear to auscultation bilaterally Abd: soft, nontender   Previous Pelvic Exam showed: Pelvic  Exam: Normal external female genitalia; Bartholin's and Skene's glands normal in appearance; urethral meatus normal in appearance, no urethral masses or discharge.    CST: negative     Speculum exam reveals normal vaginal mucosa without atrophy. Cervix normal appearance. Uterus normal single, nontender. Adnexa no mass, fullness, tenderness.     Pelvic floor strength III/V   Pelvic floor musculature: Right levator non-tender, Right obturator non-tender, Left levator non-tender, Left obturator non-tender   POP-Q:    POP-Q   -3                                            Aa   -3                                           Ba   -6.5                                              C    4.5                                            Gh   3                                            Pb   8                                            tvl    -2                                            Ap   -2  Bp   -8                                              D          Rectal Exam:  Normal external rectum    Assessment/ Plan  Assessment: The patient is a 37 y.o. year old scheduled to undergo Exam under anesthesia, Mid-urethral sling, and cystoscopy. Verbal consent was obtained for these procedures.

## 2022-07-23 NOTE — Patient Instructions (Signed)
Medications have been sent to the pharmacy.   Work note provided.

## 2022-07-24 ENCOUNTER — Encounter (HOSPITAL_COMMUNITY): Payer: Self-pay | Admitting: *Deleted

## 2022-07-24 NOTE — Progress Notes (Signed)
Spoke w/ via phone for pre-op interview--- Mary Gibbs needs dos----CBC, T&S, BMP and HCG (Qual) per surgeons               Gibbs results------ COVID test -----patient states asymptomatic no test needed Arrive at -------0530 NPO after MN NO Solid Food. Med rec completed Medications to take morning of surgery -----NONE Diabetic medication ----- Patient instructed no nail polish to be worn day of surgery Patient instructed to bring photo id and insurance card day of surgery Patient aware to have Driver (ride ) / caregiver Madilyn Fireman   for 24 hours after surgery  Patient Special Instructions ----- Pre-Op special Istructions ----- Next [id number] dose is due 07/27/22. Patient verbalized understanding she could take this dose but hold any doses after until after procedure 08/04/22. Patient verbalized understanding of instructions that were given at this phone interview. Patient denies shortness of breath, chest pain, fever, cough at this phone interview.

## 2022-07-24 NOTE — H&P (View-Only) (Signed)
Spoke w/ via phone for pre-op interview--- Mary Gibbs needs dos----CBC, T&S, BMP and HCG (Qual) per surgeons               Gibbs results------ COVID test -----patient states asymptomatic no test needed Arrive at -------0530 NPO after MN NO Solid Food. Med rec completed Medications to take morning of surgery -----NONE Diabetic medication ----- Patient instructed no nail polish to be worn day of surgery Patient instructed to bring photo id and insurance card day of surgery Patient aware to have Driver (ride ) / caregiver Mary Gibbs   for 24 hours after surgery  Patient Special Instructions ----- Pre-Op special Istructions ----- Next [id number] dose is due 07/27/22. Patient verbalized understanding she could take this dose but hold any doses after until after procedure 08/04/22. Patient verbalized understanding of instructions that were given at this phone interview. Patient denies shortness of breath, chest pain, fever, cough at this phone interview.

## 2022-07-25 DIAGNOSIS — F411 Generalized anxiety disorder: Secondary | ICD-10-CM | POA: Diagnosis not present

## 2022-07-29 ENCOUNTER — Telehealth: Payer: Self-pay | Admitting: Medical

## 2022-07-29 NOTE — Telephone Encounter (Signed)
Joi called and states she has a fu with you on 3/19 but is needing a refill on wegovy and she is asking if you want to increase dosage if you feel comfortable. She says she has lost 6 pounds in the past 2 weeks and she would like it to go to [place] at [facility] if you decide to send it in.

## 2022-07-30 ENCOUNTER — Other Ambulatory Visit: Payer: Self-pay | Admitting: Medical

## 2022-07-30 ENCOUNTER — Other Ambulatory Visit (HOSPITAL_COMMUNITY): Payer: Self-pay

## 2022-07-30 MED ORDER — WEGOVY 1.7 MG/0.75ML ~~LOC~~ SOAJ
1.7000 mg | SUBCUTANEOUS | 0 refills | Status: DC
  Filled 2022-07-30: qty 3, 28d supply, fill #0

## 2022-08-03 NOTE — Anesthesia Preprocedure Evaluation (Addendum)
Anesthesia Evaluation  Patient identified by MRN, date of birth, ID band Patient awake    Reviewed: Allergy & Precautions, NPO status , Patient's Chart, lab work & pertinent test results  Airway Mallampati: III  TM Distance: >3 FB Neck ROM: Full    Dental no notable dental hx. (+) Teeth Intact, Dental Advisory Given   Pulmonary former smoker   Pulmonary exam normal breath sounds clear to auscultation       Cardiovascular negative cardio ROS Normal cardiovascular exam Rhythm:Regular Rate:Normal     Neuro/Psych   Anxiety        GI/Hepatic hiatal hernia,,,  Endo/Other    Renal/GU Lab Results      Component                Value               Date                      CREATININE               0.68                01/29/2022                BUN                      8                   01/29/2022                NA                       135                 01/29/2022                K                        4.6                 01/29/2022                CL                       104                 01/29/2022                CO2                      22                  01/29/2022                Musculoskeletal   Abdominal  (+) + obese (BMI 44.4)  Peds  Hematology Lab Results      Component                Value               Date                      WBC                      6.8  08/04/2022                HGB                      12.0                08/04/2022                HCT                      38.0                08/04/2022                MCV                      96.7                08/04/2022                PLT                      346                 08/04/2022              Anesthesia Other Findings   Reproductive/Obstetrics                             Anesthesia Physical Anesthesia Plan  ASA: 3  Anesthesia Plan: General   Post-op Pain Management: Toradol IV (intra-op)*,  Tylenol PO (pre-op)* and Precedex   Induction: Intravenous  PONV Risk Score and Plan: 4 or greater and Ondansetron, Midazolam and Treatment may vary due to age or medical condition  Airway Management Planned: Oral ETT and Video Laryngoscope Planned  Additional Equipment: None  Intra-op Plan:   Post-operative Plan: Extubation in OR  Informed Consent: I have reviewed the patients History and Physical, chart, labs and discussed the procedure including the risks, benefits and alternatives for the proposed anesthesia with the patient or authorized representative who has indicated his/her understanding and acceptance.     Dental advisory given  Plan Discussed with: CRNA, Anesthesiologist and Surgeon  Anesthesia Plan Comments: (Pt on [facility])       Anesthesia Quick Evaluation

## 2022-08-03 NOTE — H&P (Signed)
Mary Gibbs is an 37 y.o. female presenting for scheduled surgery. She is scheduled for laparoscopic bilateral salpingectomy for elective tubal sterilization. She is also scheduled for concomitant mid urethral sling for urinary incontinence with Dr. Wannetta Gibbs, Urogynecology.   Patient is a 60Y [id number] who desires permanent sterilization Has long term partner, fiance, and they are [id number] certain they do not desire future fertility Has been using combined OCP for contraception most recently Desires opportunistic salpingectomy for ovarian cancer risk reduction No additional PMH Current meds: OCP, Wegovy for weight loss PSH: cholecystectomy  Menstrual History:  No LMP recorded.    Past Medical History:  Diagnosis Date   Anemia    @ last [facility] appt   Depression     no meds currently, was thinking of going to behavior health   H/O hiatal hernia    Hx of ulcer disease    Hx of varicella    Infection    UTI   NSVD (normal spontaneous vaginal delivery) 08/26/2012    Past Surgical History:  Procedure Laterality Date   CHOLECYSTECTOMY N/A 06/11/2017   Procedure: LAPAROSCOPIC CHOLECYSTECTOMY WITH INTRAOPERATIVE CHOLANGIOGRAM;  Surgeon: Mary Mesa, MD;  Location: [facility];  Service: General;  Laterality: N/A;   FOOT SURGERY Left    broken toe, ligament removed from back of leg   TONSILLECTOMY     As a baby    Family History  Problem Relation Age of Onset   Epilepsy Sister    Depression Sister    Bipolar disorder Sister    Hypertension Mother    Tuberculosis Mother        as a child   Diabetes Mother        diet controlled   Depression Mother    Cancer Father        prostate   Colon cancer Paternal Grandfather    Cancer Paternal Grandfather        prostate   Breast cancer Maternal Aunt        x 3;after menopause   Diabetes Maternal Grandmother    Hypertension Maternal Grandmother    Stomach cancer Paternal Grandmother    Heart disease Neg Hx     Social History:  reports  that she quit smoking about 6 years ago. Her smoking use included cigarettes. She has a 2.00 pack-year smoking history. She has never used smokeless tobacco. She reports that she does not currently use alcohol. She reports that she does not use drugs.  Allergies: No Known Allergies  No medications prior to admission.    Review of Systems  All other systems reviewed and are negative.   unknown if currently breastfeeding. Physical Exam  No results found for this or any previous visit (from the past 24 hour(s)).  No results found.  Assessment/Plan: [id number] who is consented for laparoscopic bilateral salpingectomy for elective sterilization  Patient has been thoroughly counseled in the office at multiple visits and again in the preoperative holding unit regarding permanent nature of tubal sterilization and risk of regret. Patient remains [id number] certain she does not desire future fertility and declines alternative contraception. She has been counseled on the surgical risks including but not limited to bleeding, infection, damage to surrounding organs, VTE, and inherit risks of anesthesia. She has verbalized good understanding of these risks, questions answered to her satisfaction, and consents signed at the bedside.  Patient is having concomitant surgery with Urogynecology with midurethral sling for SUI, please see separate HP for their portion of the  scheduled surgery today.  -Admit to OR -NPO -Pregnancy testing*** -No antibiotics indicated for tubal portion of surgery -SCD VTE ppx -Anticipate DC home today -DC instructions reviewed, follow up as scheduled for postop visit in [place] office on 3/26 or sooner as needed  Mary Gibbs A Mary Gibbs 08/03/2022, 4:12 PM

## 2022-08-04 ENCOUNTER — Ambulatory Visit (HOSPITAL_BASED_OUTPATIENT_CLINIC_OR_DEPARTMENT_OTHER): Payer: BC Managed Care – PPO | Admitting: Anesthesiology

## 2022-08-04 ENCOUNTER — Encounter (HOSPITAL_BASED_OUTPATIENT_CLINIC_OR_DEPARTMENT_OTHER): Payer: Self-pay | Admitting: Obstetrics and Gynecology

## 2022-08-04 ENCOUNTER — Ambulatory Visit (HOSPITAL_BASED_OUTPATIENT_CLINIC_OR_DEPARTMENT_OTHER)
Admission: RE | Admit: 2022-08-04 | Discharge: 2022-08-04 | Disposition: A | Discharge status: Home / Self Care | Payer: BC Managed Care – PPO | Attending: Obstetrics and Gynecology | Admitting: Obstetrics and Gynecology

## 2022-08-04 ENCOUNTER — Telehealth: Payer: Self-pay | Admitting: Obstetrics and Gynecology

## 2022-08-04 ENCOUNTER — Encounter (HOSPITAL_BASED_OUTPATIENT_CLINIC_OR_DEPARTMENT_OTHER)
Admission: RE | Disposition: A | Discharge status: Home / Self Care | Payer: Self-pay | Source: Home / Self Care | Attending: Obstetrics and Gynecology

## 2022-08-04 ENCOUNTER — Other Ambulatory Visit: Payer: Self-pay

## 2022-08-04 DIAGNOSIS — N393 Stress incontinence (female) (male): Secondary | ICD-10-CM | POA: Diagnosis not present

## 2022-08-04 DIAGNOSIS — Z6841 Body Mass Index (BMI) 40.0 and over, adult: Secondary | ICD-10-CM | POA: Diagnosis not present

## 2022-08-04 DIAGNOSIS — Z302 Encounter for sterilization: Secondary | ICD-10-CM | POA: Diagnosis not present

## 2022-08-04 DIAGNOSIS — Z87891 Personal history of nicotine dependence: Secondary | ICD-10-CM | POA: Insufficient documentation

## 2022-08-04 DIAGNOSIS — F419 Anxiety disorder, unspecified: Secondary | ICD-10-CM | POA: Diagnosis not present

## 2022-08-04 DIAGNOSIS — E669 Obesity, unspecified: Secondary | ICD-10-CM | POA: Insufficient documentation

## 2022-08-04 HISTORY — PX: LAPAROSCOPIC TUBAL LIGATION: SHX1937

## 2022-08-04 HISTORY — PX: BLADDER SUSPENSION: SHX72

## 2022-08-04 HISTORY — PX: CYSTOSCOPY: SHX5120

## 2022-08-04 LAB — CBC
HCT: 38 % (ref 36.0–46.0)
Hemoglobin: 12 g/dL (ref 12.0–15.0)
MCH: 30.5 pg (ref 26.0–34.0)
MCHC: 31.6 g/dL (ref 30.0–36.0)
MCV: 96.7 fL (ref 80.0–100.0)
Platelets: 346 10*3/uL (ref 150–400)
RBC: 3.93 MIL/uL (ref 3.87–5.11)
RDW: 12.5 % (ref 11.5–15.5)
WBC: 6.8 10*3/uL (ref 4.0–10.5)
nRBC: 0 % (ref 0.0–0.2)

## 2022-08-04 LAB — BASIC METABOLIC PANEL
Anion gap: 7 (ref 5–15)
BUN: 11 mg/dL (ref 6–20)
CO2: 24 mmol/L (ref 22–32)
Calcium: 8.8 mg/dL — ABNORMAL LOW (ref 8.9–10.3)
Chloride: 105 mmol/L (ref 98–111)
Creatinine, Ser: 0.69 mg/dL (ref 0.44–1.00)
GFR, Estimated: >60 mL/min (ref >60)
Glucose, Bld: 101 mg/dL — ABNORMAL HIGH (ref 70–99)
Potassium: 4.1 mmol/L (ref 3.5–5.1)
Sodium: 136 mmol/L (ref 135–145)

## 2022-08-04 LAB — TYPE AND SCREEN
ABO/RH(D): A POS
Antibody Screen: NEGATIVE

## 2022-08-04 LAB — HCG, SERUM, QUALITATIVE: Preg, Serum: NEGATIVE

## 2022-08-04 LAB — POCT PREGNANCY, URINE: Preg Test, Ur: NEGATIVE

## 2022-08-04 SURGERY — URETHROPEXY, USING TRANSVAGINAL TAPE
Anesthesia: General | Site: Vagina

## 2022-08-04 MED ORDER — LIDOCAINE 2% (20 MG/ML) 5 ML SYRINGE
INTRAMUSCULAR | Status: DC | PRN
  Administered 2022-08-04: 80 mg via INTRAVENOUS

## 2022-08-04 MED ORDER — SUGAMMADEX SODIUM 200 MG/2ML IV SOLN
INTRAVENOUS | Status: DC | PRN
  Administered 2022-08-04: 200 mg via INTRAVENOUS

## 2022-08-04 MED ORDER — KETOROLAC TROMETHAMINE 30 MG/ML IJ SOLN
INTRAMUSCULAR | Status: AC
  Filled 2022-08-04: qty 1

## 2022-08-04 MED ORDER — SODIUM CHLORIDE 0.9 % IR SOLN
Status: DC | PRN
  Administered 2022-08-04: 500 mL via INTRAVESICAL

## 2022-08-04 MED ORDER — FENTANYL CITRATE (PF) 100 MCG/2ML IJ SOLN
INTRAMUSCULAR | Status: DC | PRN
  Administered 2022-08-04: 100 ug via INTRAVENOUS

## 2022-08-04 MED ORDER — OXYCODONE HCL 5 MG PO TABS
5.0000 mg | ORAL_TABLET | Freq: Once | ORAL | Status: AC | PRN
  Administered 2022-08-04: 5 mg via ORAL

## 2022-08-04 MED ORDER — EPHEDRINE 5 MG/ML INJ
INTRAVENOUS | Status: AC
  Filled 2022-08-04: qty 5

## 2022-08-04 MED ORDER — CEFAZOLIN SODIUM-DEXTROSE 2-4 GM/100ML-% IV SOLN
INTRAVENOUS | Status: AC
  Filled 2022-08-04: qty 100

## 2022-08-04 MED ORDER — ROCURONIUM BROMIDE 10 MG/ML (PF) SYRINGE
PREFILLED_SYRINGE | INTRAVENOUS | Status: DC | PRN
  Administered 2022-08-04: 60 mg via INTRAVENOUS

## 2022-08-04 MED ORDER — DEXAMETHASONE SODIUM PHOSPHATE 10 MG/ML IJ SOLN
INTRAMUSCULAR | Status: AC
  Filled 2022-08-04: qty 1

## 2022-08-04 MED ORDER — PROPOFOL 10 MG/ML IV BOLUS
INTRAVENOUS | Status: AC
  Filled 2022-08-04: qty 20

## 2022-08-04 MED ORDER — CEFAZOLIN SODIUM-DEXTROSE 2-4 GM/100ML-% IV SOLN
2.0000 g | INTRAVENOUS | Status: AC
  Administered 2022-08-04: 2 g via INTRAVENOUS

## 2022-08-04 MED ORDER — PHENAZOPYRIDINE HCL 100 MG PO TABS
ORAL_TABLET | ORAL | Status: AC
  Filled 2022-08-04: qty 2

## 2022-08-04 MED ORDER — KETOROLAC TROMETHAMINE 30 MG/ML IJ SOLN: 30.0000 mg | Freq: Once | INTRAMUSCULAR | Status: DC | PRN

## 2022-08-04 MED ORDER — LIDOCAINE-EPINEPHRINE 1 %-1:100000 IJ SOLN
INTRAMUSCULAR | Status: DC | PRN
  Administered 2022-08-04: 20 mL

## 2022-08-04 MED ORDER — ROCURONIUM BROMIDE 10 MG/ML (PF) SYRINGE
PREFILLED_SYRINGE | INTRAVENOUS | Status: AC
  Filled 2022-08-04: qty 10

## 2022-08-04 MED ORDER — KETOROLAC TROMETHAMINE 30 MG/ML IJ SOLN
INTRAMUSCULAR | Status: DC | PRN
  Administered 2022-08-04: 30 mg via INTRAVENOUS

## 2022-08-04 MED ORDER — HYDROMORPHONE HCL 1 MG/ML IJ SOLN
0.2500 mg | INTRAMUSCULAR | Status: DC | PRN
  Administered 2022-08-04: 0.25 mg via INTRAVENOUS

## 2022-08-04 MED ORDER — MIDAZOLAM HCL 2 MG/2ML IJ SOLN
INTRAMUSCULAR | Status: AC
  Filled 2022-08-04: qty 2

## 2022-08-04 MED ORDER — ONDANSETRON HCL 4 MG/2ML IJ SOLN
INTRAMUSCULAR | Status: DC | PRN
  Administered 2022-08-04: 4 mg via INTRAVENOUS

## 2022-08-04 MED ORDER — OXYCODONE HCL 5 MG/5ML PO SOLN: 5.0000 mg | Freq: Once | ORAL | Status: AC | PRN

## 2022-08-04 MED ORDER — HYDROMORPHONE HCL 1 MG/ML IJ SOLN
INTRAMUSCULAR | Status: AC
  Filled 2022-08-04: qty 1

## 2022-08-04 MED ORDER — DEXMEDETOMIDINE HCL IN NACL 80 MCG/20ML IV SOLN
INTRAVENOUS | Status: DC | PRN
  Administered 2022-08-04: 12 ug via BUCCAL

## 2022-08-04 MED ORDER — DEXAMETHASONE SODIUM PHOSPHATE 10 MG/ML IJ SOLN
INTRAMUSCULAR | Status: DC | PRN
  Administered 2022-08-04: 10 mg via INTRAVENOUS

## 2022-08-04 MED ORDER — PROPOFOL 10 MG/ML IV BOLUS
INTRAVENOUS | Status: DC | PRN
  Administered 2022-08-04: 170 mg via INTRAVENOUS

## 2022-08-04 MED ORDER — OXYCODONE HCL 5 MG PO TABS
ORAL_TABLET | ORAL | Status: AC
  Filled 2022-08-04: qty 1

## 2022-08-04 MED ORDER — ONDANSETRON HCL 4 MG/2ML IJ SOLN: 4.0000 mg | Freq: Once | INTRAMUSCULAR | Status: DC | PRN

## 2022-08-04 MED ORDER — ONDANSETRON HCL 4 MG/2ML IJ SOLN
INTRAMUSCULAR | Status: AC
  Filled 2022-08-04: qty 2

## 2022-08-04 MED ORDER — POVIDONE-IODINE 10 % EX SWAB: 2.0000 | Freq: Once | CUTANEOUS | Status: DC

## 2022-08-04 MED ORDER — BUPIVACAINE HCL (PF) 0.25 % IJ SOLN
INTRAMUSCULAR | Status: DC | PRN
  Administered 2022-08-04: 10 mL

## 2022-08-04 MED ORDER — MIDAZOLAM HCL 5 MG/5ML IJ SOLN
INTRAMUSCULAR | Status: DC | PRN
  Administered 2022-08-04: 2 mg via INTRAVENOUS

## 2022-08-04 MED ORDER — LACTATED RINGERS IV SOLN: INTRAVENOUS | Status: DC

## 2022-08-04 MED ORDER — FENTANYL CITRATE (PF) 100 MCG/2ML IJ SOLN
INTRAMUSCULAR | Status: AC
  Filled 2022-08-04: qty 2

## 2022-08-04 MED ORDER — PHENAZOPYRIDINE HCL 100 MG PO TABS
200.0000 mg | ORAL_TABLET | ORAL | Status: AC
  Administered 2022-08-04: 200 mg via ORAL

## 2022-08-04 MED ORDER — 0.9 % SODIUM CHLORIDE (POUR BTL) OPTIME
TOPICAL | Status: DC | PRN
  Administered 2022-08-04: 1000 mL

## 2022-08-04 SURGICAL SUPPLY — 64 items
ADH SKN CLS APL DERMABOND .7 (GAUZE/BANDAGES/DRESSINGS) ×6
AGENT HMST KT MTR STRL THRMB (HEMOSTASIS)
BLADE CLIPPER SENSICLIP SURGIC (BLADE) ×4 IMPLANT
BLADE SURG 15 STRL LF DISP TIS (BLADE) ×4 IMPLANT
BLADE SURG 15 STRL SS (BLADE) ×3
CABLE HIGH FREQUENCY MONO STRZ (ELECTRODE) IMPLANT
DERMABOND ADVANCED .7 DNX12 (GAUZE/BANDAGES/DRESSINGS) ×8 IMPLANT
DRAPE SURG IRRIG POUCH 19X23 (DRAPES) ×4 IMPLANT
DRSG OPSITE POSTOP 3X4 (GAUZE/BANDAGES/DRESSINGS) ×4 IMPLANT
DURAPREP 26ML APPLICATOR (WOUND CARE) ×4 IMPLANT
ELECT REM PT RETURN 9FT ADLT (ELECTROSURGICAL) ×3
ELECTRODE REM PT RTRN 9FT ADLT (ELECTROSURGICAL) IMPLANT
GAUZE 4X4 16PLY ~~LOC~~+RFID DBL (SPONGE) ×4 IMPLANT
GLOVE BIOGEL PI IND STRL 6.5 (GLOVE) ×4 IMPLANT
GLOVE BIOGEL PI IND STRL 7.0 (GLOVE) ×16 IMPLANT
GLOVE ECLIPSE 6.0 STRL STRAW (GLOVE) ×4 IMPLANT
GLOVE ECLIPSE 6.5 STRL STRAW (GLOVE) ×4 IMPLANT
GOWN STRL REUS W/TWL LRG LVL3 (GOWN DISPOSABLE) ×20 IMPLANT
HIBICLENS CHG 4% 4OZ BTL (MISCELLANEOUS) ×4 IMPLANT
HOLDER FOLEY CATH W/STRAP (MISCELLANEOUS) ×4 IMPLANT
IV NS 1000ML (IV SOLUTION) ×3
IV NS 1000ML BAXH (IV SOLUTION) IMPLANT
KIT PINK PAD W/HEAD ARE REST (MISCELLANEOUS) ×3
KIT PINK PAD W/HEAD ARM REST (MISCELLANEOUS) ×4 IMPLANT
KIT TURNOVER CYSTO (KITS) ×8 IMPLANT
LIGASURE VESSEL 5MM BLUNT TIP (ELECTROSURGICAL) IMPLANT
MANIFOLD NEPTUNE II (INSTRUMENTS) ×4 IMPLANT
MANIPULATOR UTERINE 4.5 ZUMI (MISCELLANEOUS) IMPLANT
NDL HYPO 22X1.5 SAFETY MO (MISCELLANEOUS) ×4 IMPLANT
NDL INSUFFLATION 14GA 120MM (NEEDLE) ×4 IMPLANT
NEEDLE HYPO 22X1.5 SAFETY MO (MISCELLANEOUS) ×3 IMPLANT
NEEDLE INSUFFLATION 14GA 120MM (NEEDLE) ×3 IMPLANT
NEEDLE SAFETY HYPO 22GAX1.5 (MISCELLANEOUS) ×3
NS IRRIG 1000ML POUR BTL (IV SOLUTION) ×4 IMPLANT
PACK CYSTO (CUSTOM PROCEDURE TRAY) ×4 IMPLANT
PACK LAPAROSCOPY BASIN (CUSTOM PROCEDURE TRAY) ×4 IMPLANT
PACK VAGINAL WOMENS (CUSTOM PROCEDURE TRAY) ×4 IMPLANT
PROTECTOR NERVE ULNAR (MISCELLANEOUS) ×4 IMPLANT
RETRACTOR LONE STAR DISPOSABLE (INSTRUMENTS) ×4 IMPLANT
RETRACTOR STAY HOOK 5MM (MISCELLANEOUS) ×4 IMPLANT
SCISSORS LAP 5X35 DISP (ENDOMECHANICALS) IMPLANT
SET IRRIG TUBING LAPAROSCOPIC (IRRIGATION / IRRIGATOR) IMPLANT
SET IRRIG Y TYPE TUR BLADDER L (SET/KITS/TRAYS/PACK) ×4 IMPLANT
SET TUBE SMOKE EVAC HIGH FLOW (TUBING) ×4 IMPLANT
SLEEVE SCD COMPRESS KNEE MED (STOCKING) ×8 IMPLANT
SPIKE FLUID TRANSFER (MISCELLANEOUS) ×4 IMPLANT
SUCTION FRAZIER HANDLE 10FR (MISCELLANEOUS) ×3
SUCTION TUBE FRAZIER 10FR DISP (MISCELLANEOUS) ×4 IMPLANT
SURGIFLO W/THROMBIN 8M KIT (HEMOSTASIS) IMPLANT
SUT ABS MONO DBL WITH NDL 48IN (SUTURE) IMPLANT
SUT VIC AB 2-0 SH 27 (SUTURE) ×3
SUT VIC AB 2-0 SH 27XBRD (SUTURE) ×4 IMPLANT
SUT VIC AB 4-0 PS2 27 (SUTURE) ×4 IMPLANT
SUT VICRYL 0 UR6 27IN ABS (SUTURE) ×4 IMPLANT
SYR 5ML LL (SYRINGE) ×4 IMPLANT
SYR BULB EAR ULCER 3OZ GRN STR (SYRINGE) ×4 IMPLANT
SYS BAG RETRIEVAL 10MM (BASKET)
SYS SLING ADV FIT BLUE TRNSVAG (Sling) IMPLANT
SYSTEM BAG RETRIEVAL 10MM (BASKET) IMPLANT
TOWEL OR 17X24 6PK STRL BLUE (TOWEL DISPOSABLE) ×12 IMPLANT
TRAY FOLEY W/BAG SLVR 14FR LF (SET/KITS/TRAYS/PACK) ×8 IMPLANT
TROCAR Z-THREAD FIOS 11X100 BL (TROCAR) IMPLANT
TROCAR Z-THREAD FIOS 5X100MM (TROCAR) ×8 IMPLANT
WARMER LAPAROSCOPE (MISCELLANEOUS) ×4 IMPLANT

## 2022-08-04 NOTE — Op Note (Signed)
EMI VELASQUES [birth year] [id number]   Operative Note  PROCEDURE: laparoscopic bilateral salpingectomy  PRE-OPERATIVE DIAGNOSIS: desires permanent female sterilization  POST-OPERATIVE DIAGNOSIS: desires permanent female sterilization  SURGEON: Dr. Langley Gauss, DO  ASSISTANT: Olivia Mackie RNFA  FINDINGS: laparoscopy reveals normal female pelvic anatomy including small mobile uterus with smooth contours, normal appearing bilateral fallopian tubes and ovaries  SPECIMENS: bilateral fallopian tubes  EBL: minimal  FLUIDS: per anesthesia  COMPLICATIONS: None  PROCEDURE IN DETAIL:   After the patient was appropriately consented in the holding area, she was taken to the operating room where general anesthesia was administered without complications. The patient was prepped and draped in the usual fashion.  Dr. Wannetta Sender performed her portion of the scheduled surgery first, please see separate operative note for further details. She had placed a uterine manipulator and Foley catheter remained in place to drain the bladder.  Attention was made to the abdomen. A scalpel was used to make a small transverse skin incision just inferior to the umbilicus. The Veress needle was advanced through the skin incision until 2 clicks were appreciated. The gas was connected and turned on and a low opening pressure was noted. Pneumoperitoneum was achieved without complications. A 5 mm laparoscope and trocar sleeve was inserted through the umbilical incision and the abdomen was entered under direct visualization. Inspection of the abdomen revealed the findings as noted above and no obvious injuries noted. The patient was placed in Trendelenburg positioning to facilitate pelvic visualization. Lower quadrant ports were placed bilaterally at points approximately 2 cm superior and 2 cm medial to the ASIS. 5 mm trocars were placed bilaterally under direct laparoscopic visualization. Starting on the right side, the LigaSure  device was used to sequentially cauterize and cut the mesosalpinx just inferior to the fallopian tube and transecting the fallopian tube medially at the cornual region. Attention was then turned to the right and transection performed in a similar fashion. The fallopian tubes were removed through the lateral ports and sent for final surgical pathology. Excellent hemostasis was noted at the surgical site. The pressure was reduced and the patient laid flat and continued hemostasis noted. The gas was released from the abdomen. The skin incisions were closed with 4-0 Vicryl and skin glue. Local anesthetic using 0.25% Marcaine was injected at the incision sites. The uterine manipulator was removed. The patient tolerated the procedure well and was taken to the recovery room in stable condition. All lap, needle, and instrument counts were correct.  Demira Gwynne A Demiana Crumbley 08/04/22 8:53 AM

## 2022-08-04 NOTE — Interval H&P Note (Signed)
History and Physical Interval Note:  08/04/2022 7:19 AM  Mary Gibbs  has presented today for surgery, with the diagnosis of stress urinary incontinence, elective sterilization.  The various methods of treatment have been discussed with the patient and family. After consideration of risks, benefits and other options for treatment, the patient has consented to  Procedure(s): TRANSVAGINAL TAPE (TVT) PROCEDURE (N/A) CYSTOSCOPY (N/A) as a surgical intervention.  The patient's history has been reviewed, patient examined, no change in status, stable for surgery.  I have reviewed the patient's chart and labs.  Questions were answered to the patient's satisfaction.     [name]

## 2022-08-04 NOTE — Anesthesia Procedure Notes (Signed)
Procedure Name: Intubation Date/Time: 08/04/2022 7:39 AM  Performed by: Keylon Labelle D, CRNAPre-anesthesia Checklist: Patient identified, Emergency Drugs available, Suction available and Patient being monitored Patient Re-evaluated:Patient Re-evaluated prior to induction Oxygen Delivery Method: Circle system utilized Preoxygenation: Pre-oxygenation with 100% oxygen Induction Type: IV induction Ventilation: Mask ventilation without difficulty Laryngoscope Size: Mac and 4 Grade View: Grade I Tube type: Oral Tube size: 7.0 mm Number of attempts: 1 Airway Equipment and Method: Stylet Placement Confirmation: ETT inserted through vocal cords under direct vision, positive ETCO2 and breath sounds checked- equal and bilateral Secured at: 21 cm Tube secured with: Tape Dental Injury: Teeth and Oropharynx as per pre-operative assessment

## 2022-08-04 NOTE — Discharge Instructions (Addendum)
Urogynecology POST OPERATIVE INSTRUCTIONS  General Instructions Recovery (not bed rest) will last approximately 6 weeks Walking is encouraged, but refrain from strenuous exercise/ housework/ heavy lifting for 2 weeks. No lifting >10lbs  Nothing in the vagina- NO intercourse, tampons or douching Bathing:  Do not submerge in water (NO swimming, bath, hot tub, etc) until after your postop visit. You can shower starting the day after surgery.  No driving until you are not taking narcotic pain medicine and until your pain is well enough controlled that you can slam on the breaks or make sudden movements if needed.   Taking your medications Please take your acetaminophen and ibuprofen on a schedule for the first 48 hours. Take '600mg'$  ibuprofen, then take '500mg'$  acetaminophen 3 hours later, then continue to alternate ibuprofen and acetaminophen. That way you are taking each type of medication every 6 hours. Take the prescribed narcotic (oxycodone, tramadol, etc) as needed, with a maximum being every 4 hours.  Take a stool softener daily to keep your stools soft and preventing you from straining. If you have diarrhea, you decrease your stool softener. This is explained more below. We have prescribed you Miralax.  Reasons to Call the Nurse (see last page for phone numbers) Heavy Bleeding (changing your pad every 1-2 hours) Persistent nausea/vomiting Fever (100.4 degrees or more) Incision problems (pus or other fluid coming out, redness, warmth, increased pain)  Things to Expect After Surgery Mild to Moderate pain is normal during the first day or two after surgery. If prescribed, take Ibuprofen or Tylenol first and use the stronger medicine for "break-through" pain. You can overlap these medicines because they work differently.   Constipation   To Prevent Constipation:  Eat a well-balanced diet including protein, grains, fresh fruit and vegetables.  Drink plenty of fluids. Walk regularly.  Depending  on specific instructions from your physician: take Miralax daily and additionally you can add a stool softener (colace/ docusate) and fiber supplement. Continue as long as you're on pain medications.   To Treat Constipation:  If you do not have a bowel movement in 2 days after surgery, you can take 2 Tbs of Milk of Magnesia 1-2 times a day until you have a bowel movement. If diarrhea occurs, decrease the amount or stop the laxative. If no results with Milk of Magnesia, you can drink a bottle of magnesium citrate which you can purchase over the counter.  Fatigue:  This is a normal response to surgery and will improve with time.  Plan frequent rest periods throughout the day.  Gas Pain:  This is very common but can also be very painful! Drink warm liquids such as herbal teas, bouillon or soup. Walking will help you pass more gas.  Mylicon or Gas-X can be taken over the counter.  Leaking Urine:  Varying amounts of leakage may occur after surgery.  This should improve with time. Your bladder needs at least 3 months to recover from surgery. If you leak after surgery, be sure to mention this to your doctor at your post-op visit. If you were taking medications for overactive bladder prior to surgery, be sure to restart the medications immediately after surgery.  Incisions: If you have incisions on your abdomen, the skin glue will dissolve on its own over time. It is ok to gently rinse with soap and water over these incisions but do not scrub.  Catheter Approximately 50% of patients are unable to urinate after surgery and need to go home with a catheter. This  allows your bladder to rest so it can return to full function. If you go home with a catheter, the office will call to set up a voiding trial a few days after surgery. For most patients, by this visit, they are able to urinate on their own. Long term catheter use is rare.   Return to Work  As work demands and recovery times vary widely, it is hard to  predict when you will want to return to work. If you have a desk job with no strenuous physical activity, and if you would like to return sooner than generally recommended, discuss this with your provider or call our office.   Post op concerns  For non-emergent issues, please call the Urogynecology Nurse. Please leave a message and someone will contact you within one business day.  You can also send a message through [place].   AFTER HOURS (After 5:00 PM and on weekends):  For urgent matters that cannot wait until the next business day. Call our office [phone] and connect to the doctor on call.  Please reserve this for important issues.   **FOR ANY TRUE EMERGENCY ISSUES CALL 911 OR GO TO THE NEAREST EMERGENCY ROOM.** Please inform our office or the doctor on call of any emergency.     APPOINTMENTS: Call [phone]     Post Anesthesia Home Care Instructions  Activity: Get plenty of rest for the remainder of the day. A responsible individual must stay with you for 24 hours following the procedure.  For the next 24 hours, DO NOT: -Drive a car -Paediatric nurse -Drink alcoholic beverages -Take any medication unless instructed by your physician -Make any legal decisions or sign important papers.  Meals: Start with liquid foods such as gelatin or soup. Progress to regular foods as tolerated. Avoid greasy, spicy, heavy foods. If nausea and/or vomiting occur, drink only clear liquids until the nausea and/or vomiting subsides. Call your physician if vomiting continues.  Special Instructions/Symptoms: Your throat may feel dry or sore from the anesthesia or the breathing tube placed in your throat during surgery. If this causes discomfort, gargle with warm salt water. The discomfort should disappear within 24 hours.

## 2022-08-04 NOTE — Transfer of Care (Signed)
Immediate Anesthesia Transfer of Care Note  Patient: Mary Gibbs  Procedure(s) Performed: TRANSVAGINAL TAPE (TVT) PROCEDURE (Vagina ) CYSTOSCOPY (Bladder) LAPAROSCOPIC TUBAL LIGATION BY SALPINGECTOMY (Bilateral: Abdomen)  Patient Location: PACU  Anesthesia Type:General  Level of Consciousness: awake, alert , and oriented  Airway & Oxygen Therapy: Patient Spontanous Breathing and Patient connected to nasal cannula oxygen  Post-op Assessment: Report given to RN and Post -op Vital signs reviewed and stable  Post vital signs: Reviewed and stable  Last Vitals:  Vitals Value Taken Time  BP    Temp    Pulse    Resp 27 08/04/22 0902  SpO2    Vitals shown include unvalidated device data.  Last Pain:  Vitals:   08/04/22 0607  TempSrc: Oral      Patients Stated Pain Goal: 4 ([id number])  Complications: No notable events documented.

## 2022-08-04 NOTE — Op Note (Signed)
Operative Note  Preoperative Diagnosis: stress urinary incontinence  Postoperative Diagnosis: same  Procedures performed:  Midurethral sling (advantage Fit), cystoscopy  Implants:  Implant Name Type Inv. Item Serial No. Manufacturer Lot No. LRB No. Used Action  SYS SLING ADV FIT BLUE TRNSVAG - [id number] Sling SYS SLING ADV FIT BLUE TRNSVAG  Wilkie Aye [id number]  1 Implanted    Attending Surgeon: Sherlene Shams, MD  Anesthesia: General endotracheal  Findings: On cystoscopy, normal bladder and urethral mucosa without injury, lesion or foreign body.     Specimens:  None  Estimated blood loss: 30 mL  IV fluids: see flowsheet  Urine output: see flowsheet  Complications: none  Procedure in Detail:  After informed consent was obtained, the patient was taken to the operating room where she was placed under anesthesia.  She was then placed in the dorsal lithotomy position with allen stirrups,  and prepped and draped in the usual sterile fashion.  A strap was placed across her upper abdomen on top of her gown so it was not in direct contact with her skin.  Care was taken to avoid hyperflexion or hyperextension of her upper and lower extremities. A foley catheter was placed.  A lonestar self-retraining retractor was placed with 4 stay hooks. The mid urethral area was located on the anterior vaginal wall.  Two Allis clamps were placed at the level of the midurethra. 1% lidocaine with epinephrine was injected into the vaginal mucosa. A vertical incision was made between the two clamps using a 15-blade scalpel.  Using sharp dissection, Metzenbaum scissors were used to make a periurethral tunnel from the vaginal incision towards the pubic rami bilaterally for the future sling tracts. The bladder was ensured to be empty. The trocar and attached sling were introduced into the right side of the periurethral vaginal incision, just inferior to the pubic symphysis on the right side. The trocar  was guided through the endopelvic fascia and directly vertically.  While hugging the cephalad surface of the pubic bone, the trocar was guided out through the abdomen 2 fingerbreadths lateral to midline at the level of the pubic symphysis on the ipsilateral side. The trocar was placed on the left side in a similar fashion.  A 70-degree cystoscope was introduced, and 360-degree inspection revealed no trauma or trocars in the bladder, with brisk bilateral ureteral efflux.  The bladder was drained and the cystoscope was removed.  The Foley catheter was reinserted.  The sling was brought to lie beneath the mid-urethra.  A needle driver was placed behind the sling to ensure no tension.   The plastic sheath was removed from the sling and the distal ends of the sling were trimmed just below the level of the skin incisions.  Tension-free positioning of the sling was confirmed. Vaginal inspection revealed no vaginotomy or sling perforations of the mucosa.  The vaginal mucosal edges were reapproximated using 2-0 Vicryl in a running fashion.  The vagina was copiously irrigated.  Hemostasis was again noted. Vaginal packing not placed. The suprapubic sling incisions were closed with Dermabond. The patient tolerated the procedure well.  Dr Lanny Cramp then proceeded with bilateral salpingectomy for sterilization. Please see her operative note for further details of the procedure.   [name], MD

## 2022-08-04 NOTE — Telephone Encounter (Signed)
Mary Gibbs underwent midurethral sling and cystoscopy on 08/04/22. She also had a concurrent laparoscopic salpingectomy with Dr Lanny Cramp.  She passed her voiding trial.  371m was backfilled into the bladder Voided 3070m PVR by bladder scan was 50101m  She was discharged without a catheter. Please call her for a routine post op check. Thanks!  MicJaquita FoldsD

## 2022-08-04 NOTE — Anesthesia Postprocedure Evaluation (Signed)
Anesthesia Post Note  Patient: JERICKA SYSLO  Procedure(s) Performed: TRANSVAGINAL TAPE (TVT) PROCEDURE (Vagina ) CYSTOSCOPY (Bladder) LAPAROSCOPIC TUBAL LIGATION BY SALPINGECTOMY (Bilateral: Abdomen)     Patient location during evaluation: PACU Anesthesia Type: General Level of consciousness: awake and alert Pain management: pain level controlled Vital Signs Assessment: post-procedure vital signs reviewed and stable Respiratory status: spontaneous breathing, nonlabored ventilation, respiratory function stable and patient connected to nasal cannula oxygen Cardiovascular status: blood pressure returned to baseline and stable Postop Assessment: no apparent nausea or vomiting Anesthetic complications: no  No notable events documented.  Last Vitals:  Vitals:   08/04/22 1000 08/04/22 1050  BP: 112/64 110/64  Pulse: 75 81  Resp: 16 16  Temp:    SpO2: 95% 96%    Last Pain:  Vitals:   08/04/22 1050  TempSrc:   PainSc: 4                  Barnet Glasgow

## 2022-08-05 ENCOUNTER — Encounter (HOSPITAL_BASED_OUTPATIENT_CLINIC_OR_DEPARTMENT_OTHER): Payer: Self-pay | Admitting: Obstetrics and Gynecology

## 2022-08-05 LAB — SURGICAL PATHOLOGY

## 2022-08-06 NOTE — Telephone Encounter (Signed)
Patient reports she is not really in pain but is having some soreness. Is taking ibuprofen a few times a day.  Reports she has been able to have a bowel movement. She reports she took some magnesium citrate.  Reports she has not seen any bleeding.  Reports she has no leaking and has been able to void normally.  Overall feels she is doing well and reports she will the call the office with any concerns.

## 2022-08-12 DIAGNOSIS — F411 Generalized anxiety disorder: Secondary | ICD-10-CM | POA: Diagnosis not present

## 2022-08-19 ENCOUNTER — Ambulatory Visit: Payer: BC Managed Care – PPO | Admitting: Medical

## 2022-08-21 DIAGNOSIS — F411 Generalized anxiety disorder: Secondary | ICD-10-CM | POA: Diagnosis not present

## 2022-09-03 ENCOUNTER — Ambulatory Visit: Payer: BC Managed Care – PPO | Admitting: Medical

## 2022-09-03 VITALS — BP 110/68 | HR 95 | Wt 231.4 lb

## 2022-09-03 DIAGNOSIS — Z79899 Other long term (current) drug therapy: Secondary | ICD-10-CM

## 2022-09-03 DIAGNOSIS — Z7689 Persons encountering health services in other specified circumstances: Secondary | ICD-10-CM | POA: Diagnosis not present

## 2022-09-03 DIAGNOSIS — Z6841 Body Mass Index (BMI) 40.0 and over, adult: Secondary | ICD-10-CM

## 2022-09-03 MED ORDER — WEGOVY 2.4 MG/0.75ML ~~LOC~~ SOAJ: 2.4000 mg | SUBCUTANEOUS | 2 refills | Status: DC

## 2022-09-03 NOTE — Progress Notes (Signed)
Subjective:  Mary Gibbs is a 37 y.o. female who presents for Chief Complaint  Patient presents with   Weight Loss    Weight loss follow-up     Here for follow-up and weight loss management.  She is currently on Wegovy 1.7 mg.  She does feel like it helps with cravings.  She has not lost a lot of weight on it yet but has lost about 10 pounds in the last month and a half.  Typically eating 2 meals per day.  She is doing well with exercise.  Exercise - does a dance class every other week, doing spin class, no weight bearing exercise.  Doing about 3 days of exercise per week.  She brought physical forms for college to be completed.  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    No other c/o.  Past Medical History:  Diagnosis Date   Anemia    @ last [facility] appt   Depression     no meds currently, was thinking of going to behavior health   H/O hiatal hernia    Hx of ulcer disease    Hx of varicella    Infection    UTI   NSVD (normal spontaneous vaginal delivery) 08/26/2012   Current Outpatient Medications on File Prior to Visit  Medication Sig Dispense Refill   acetaminophen (TYLENOL) 500 MG tablet Take 1 tablet (500 mg total) by mouth every 6 (six) hours as needed (pain). 30 tablet 0   BIOTIN PO Take 1 capsule by mouth daily.     BLACK CURRANT SEED OIL PO Take 2 capsules by mouth daily. Green tea supplement     ibuprofen (ADVIL) 600 MG tablet Take 1 tablet (600 mg total) by mouth every 6 (six) hours as needed. 30 tablet 0   polyethylene glycol powder (GLYCOLAX/MIRALAX) 17 GM/SCOOP powder Take 17 g by mouth daily. Drink 17g (1 scoop) dissolved in water per day. 255 g 0   Probiotic Product (ALIGN) 4 MG CAPS Take 1 capsule (4 mg total) by mouth daily. 30 capsule 2   Semaglutide-Weight Management (WEGOVY) 1.7 MG/0.75ML SOAJ Inject 1.7 mg into the skin once a week. 3 mL 0   No current  facility-administered medications on file prior to visit.     The following portions of the patient's history were reviewed and updated as appropriate: allergies, current medications, past family history, past medical history, past social history, past surgical history and problem list.  ROS Otherwise as in subjective above  Objective: BP 110/68   Pulse 95   Wt 231 lb 6.4 oz (105 kg)   BMI 43.72 kg/m   Wt Readings from Last 3 Encounters:  09/03/22 231 lb 6.4 oz (105 kg)  08/04/22 235 lb 3.2 oz (106.7 kg)  07/23/22 233 lb 9.6 oz (106 kg)   General appearance: alert, no distress, well developed, well nourished Psych: pleasant, answers questions appropriately    Assessment: Encounter Diagnoses  Name Primary?   Medication management Yes   Encounter for weight management    BMI 40.0-44.9, adult       Plan: Weight management-increase to 2.4 mg Wegovy dose.  Continue efforts to lose weight.  We discussed her eating strategies, trying to work on vegetarian strategy, continue with her exercise program . She is doing great with that.  Focus on getting good sleep.    I completed her forms for nursing school  including titers for the recent labs she had done, vaccine documentation and physical form.  Mary Gibbs was seen today for weight loss.  Diagnoses and all orders for this visit:  Medication management  Encounter for weight management  BMI 40.0-44.9, adult  Other orders -     Semaglutide-Weight Management (WEGOVY) 2.4 MG/0.75ML SOAJ; Inject 2.4 mg into the skin once a week.   Follow up 2-3 months

## 2022-09-04 DIAGNOSIS — F411 Generalized anxiety disorder: Secondary | ICD-10-CM | POA: Diagnosis not present

## 2022-09-10 ENCOUNTER — Telehealth: Payer: Self-pay | Admitting: Medical

## 2022-09-10 NOTE — Telephone Encounter (Signed)
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 She is going to drop the form off tomorrow during her lab visit.

## 2022-09-11 ENCOUNTER — Other Ambulatory Visit (INDEPENDENT_AMBULATORY_CARE_PROVIDER_SITE_OTHER): Payer: BC Managed Care – PPO

## 2022-09-11 DIAGNOSIS — Z23 Encounter for immunization: Secondary | ICD-10-CM

## 2022-09-11 NOTE — Telephone Encounter (Signed)
Filled out new form and adam gave it to her at her visit

## 2022-09-16 ENCOUNTER — Encounter: Payer: Self-pay | Admitting: Obstetrics and Gynecology

## 2022-09-16 ENCOUNTER — Ambulatory Visit (INDEPENDENT_AMBULATORY_CARE_PROVIDER_SITE_OTHER): Payer: BC Managed Care – PPO | Admitting: Obstetrics and Gynecology

## 2022-09-16 ENCOUNTER — Encounter: Payer: BC Managed Care – PPO | Admitting: Obstetrics and Gynecology

## 2022-09-16 VITALS — BP 109/63 | HR 87

## 2022-09-16 DIAGNOSIS — Z9889 Other specified postprocedural states: Secondary | ICD-10-CM

## 2022-09-16 NOTE — Progress Notes (Signed)
[facility]  Date of Visit: 09/16/2022  History of Present Illness: Ms. Coreas is a 37 y.o. female scheduled today for a post-operative visit.   Surgery: s/p Midurethral sling (advantage Fit), cystoscopy on 08/04/22  She passed her postoperative void trial.   Postoperative course has been uncomplicated.   Today she reports she is not leaking at all. She has more allergies/ sneezing recently and has not noticed any leakage.   UTI in the last 6 weeks? No  Pain? No  She has returned to her normal activity (except for postop restrictions) Vaginal bulge? No  Stress incontinence: No  Urgency/frequency: No  Urge incontinence: No  Voiding dysfunction: No  Bowel issues: No   Medications: She has a current medication list which includes the following prescription(s): acetaminophen, biotin, black currant seed oil, ibuprofen, polyethylene glycol powder, align, wegovy, and wegovy.   Allergies: Patient has No Known Allergies.   Physical Exam: BP 109/63   Pulse 87   LMP 09/13/2022   Breastfeeding No    Suprapubic Incisions: well healed, small keloid present.  Pelvic Examination: Vagina: Incisions healing well. On speculum, scant blood in vaginal vault. Sutures are not present at incision line and there is not granulation tissue. No tenderness. No visible or palpable mesh.   ---------------------------------------------------------  Assessment and Plan: No diagnosis found.  -Well healed. Has had good results with the sling.  - Can resume regular activity including exercise and intercourse,  if desired, as well as baths/ swimming  Follow up as needed  Marguerita Beards, MD

## 2022-09-18 DIAGNOSIS — F411 Generalized anxiety disorder: Secondary | ICD-10-CM | POA: Diagnosis not present

## 2022-10-01 DIAGNOSIS — F411 Generalized anxiety disorder: Secondary | ICD-10-CM | POA: Diagnosis not present

## 2022-10-10 ENCOUNTER — Telehealth: Payer: Self-pay

## 2022-10-10 NOTE — Telephone Encounter (Signed)
Received fax from [facility] advising the PA was denied as Reginal Lutes is not covered per the pt's benefit booklet.

## 2022-10-13 NOTE — Telephone Encounter (Signed)
Pt was advised of Shanes message

## 2022-10-13 NOTE — Telephone Encounter (Signed)
Called and left vm for pt to return call.

## 2022-10-16 DIAGNOSIS — F411 Generalized anxiety disorder: Secondary | ICD-10-CM | POA: Diagnosis not present

## 2022-10-30 DIAGNOSIS — F411 Generalized anxiety disorder: Secondary | ICD-10-CM | POA: Diagnosis not present

## 2022-11-14 DIAGNOSIS — F411 Generalized anxiety disorder: Secondary | ICD-10-CM | POA: Diagnosis not present

## 2022-12-04 DIAGNOSIS — F411 Generalized anxiety disorder: Secondary | ICD-10-CM | POA: Diagnosis not present

## 2022-12-19 IMAGING — US US OB COMP LESS 14 WK
1 series · 15 of 28 positions shown · non-contrast
Comparison: None.

CLINICAL DATA: Vaginal bleeding.  First trimester pregnancy.

EXAM:
OBSTETRIC <14 WK ULTRASOUND
TECHNIQUE: Transabdominal ultrasound was performed for evaluation of the
gestation as well as the maternal uterus and adnexal regions.

[Series 1: us ob comp less 14 wk · 30 acquisitions, 15 frames shown]
[im 1/30]
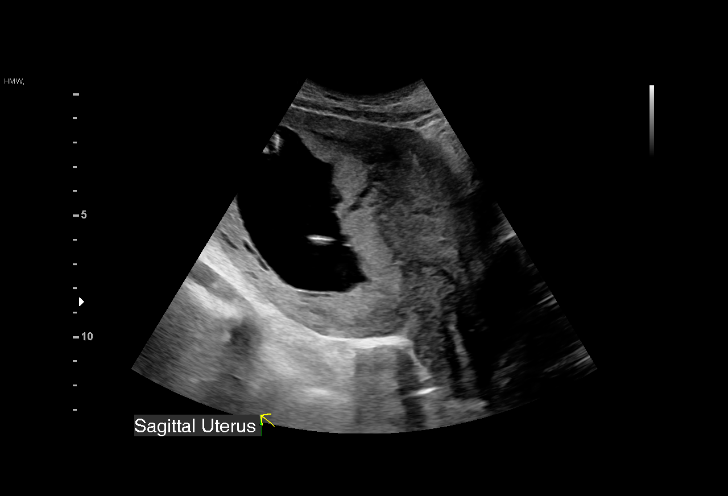
[im 3/30]
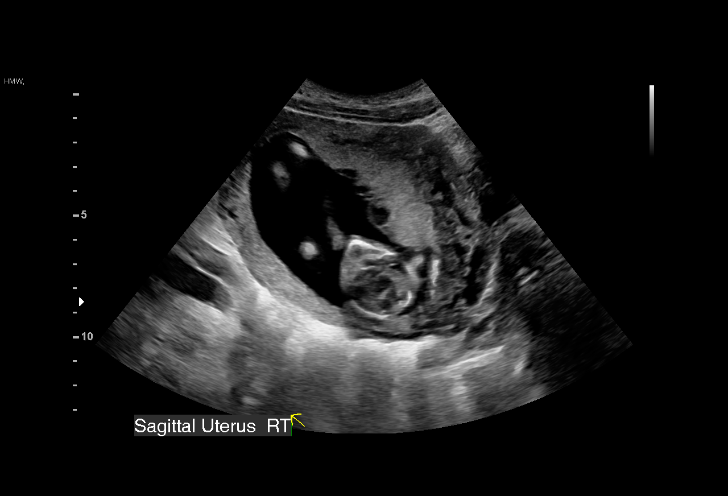
[im 5/30]
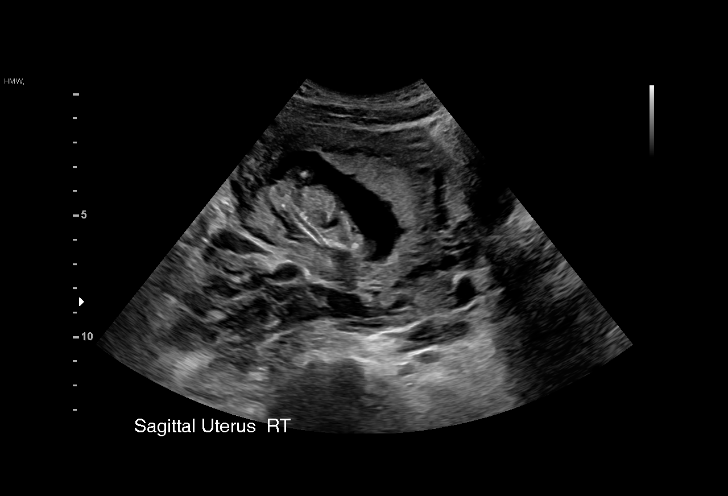
[im 7/30]
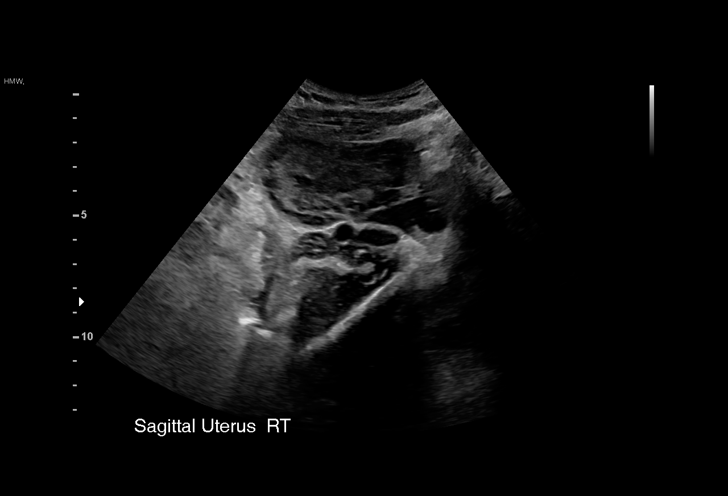
[im 9/30]
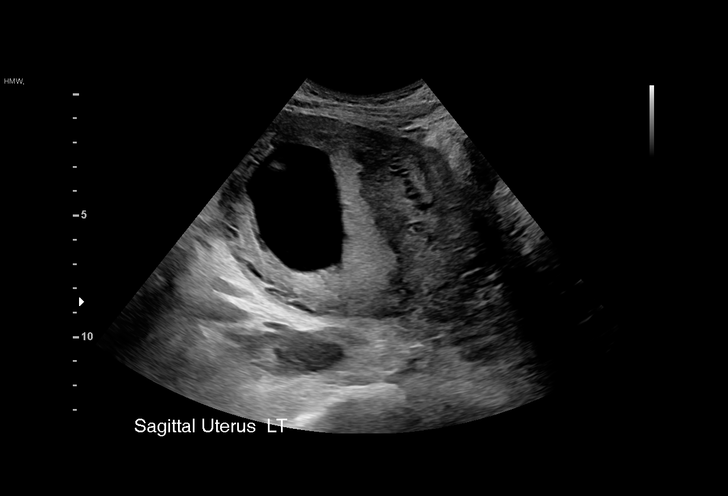
[im 11/30]
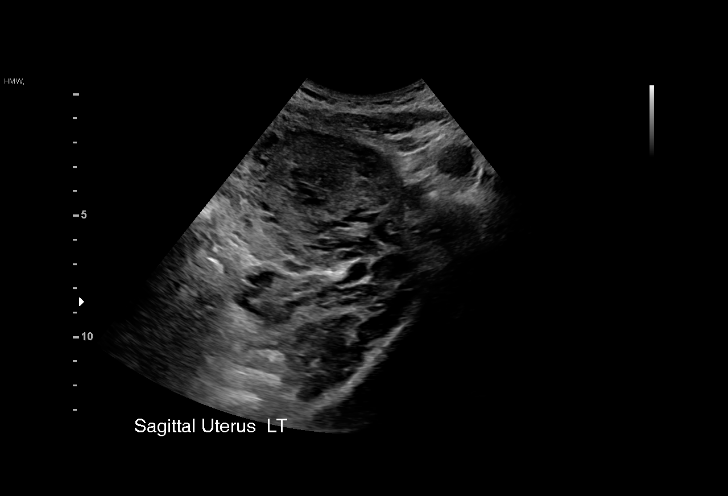
[im 13/30]
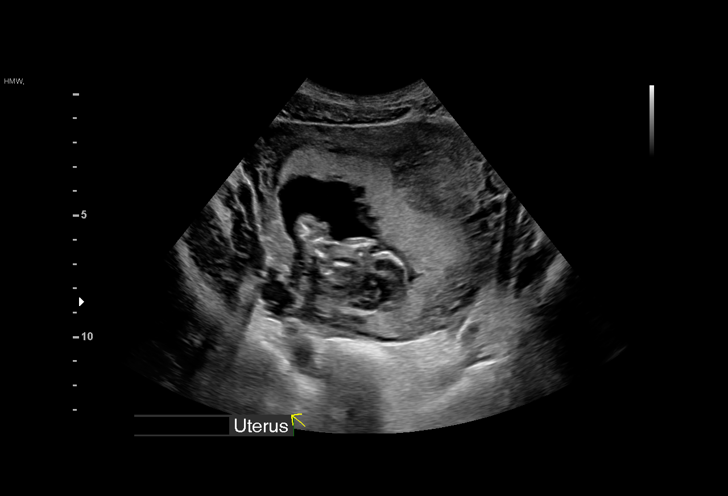
[im 16/30]
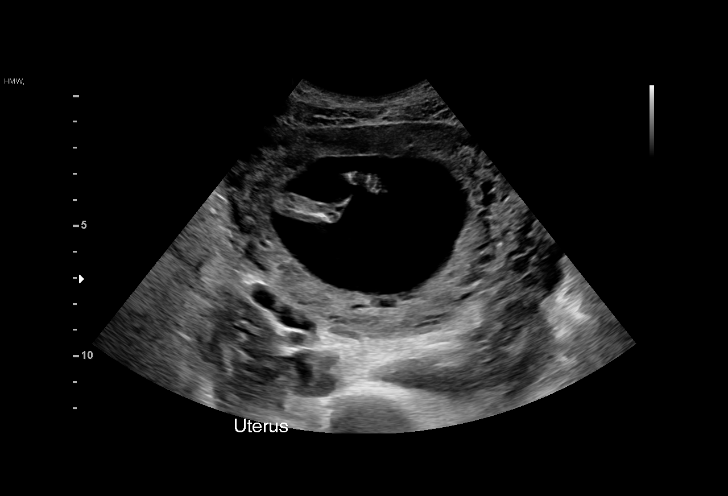
[im 17/30]
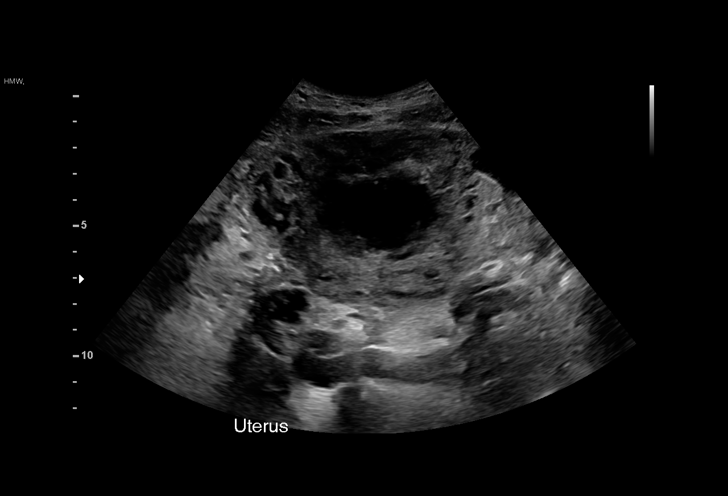
[im 19/30]
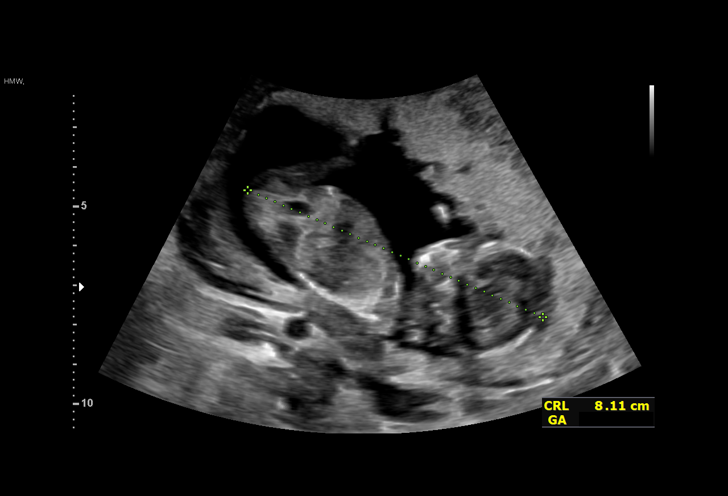
[im 21/30]
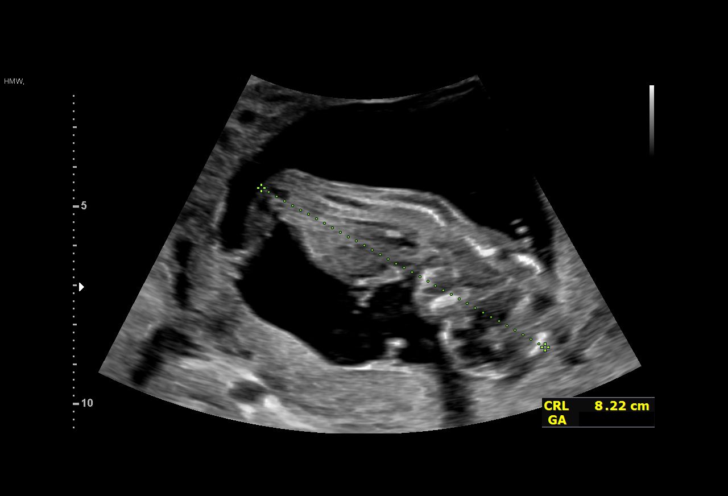
[im 23/30]
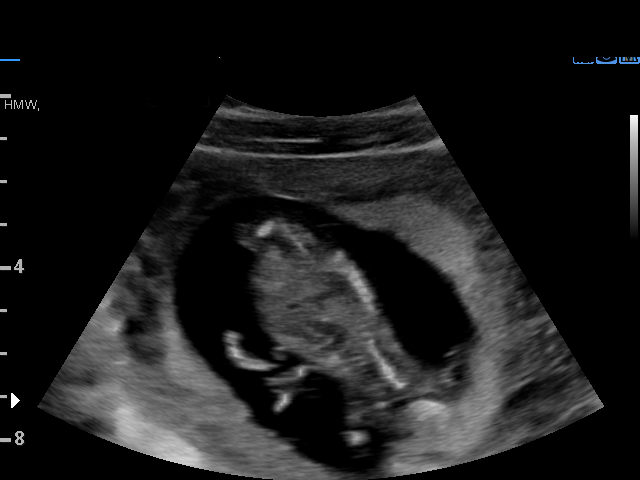
[im 25/30]
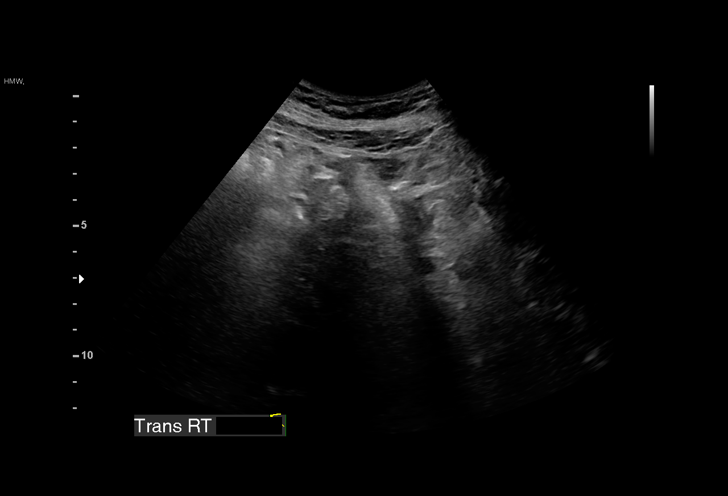
[im 27/30]
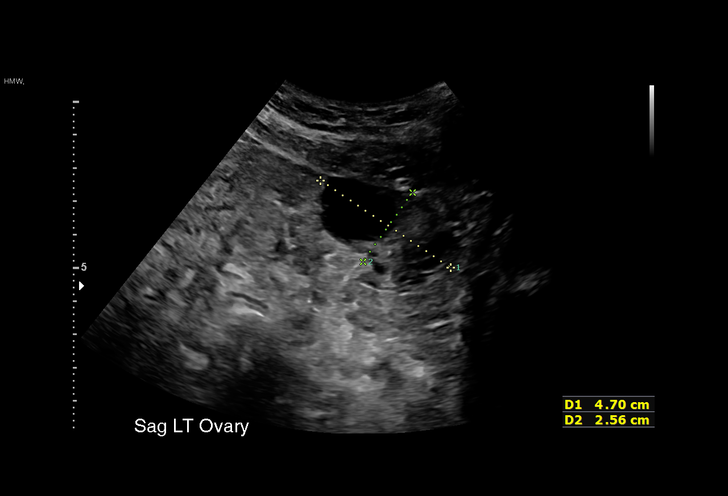
[im 30/30]
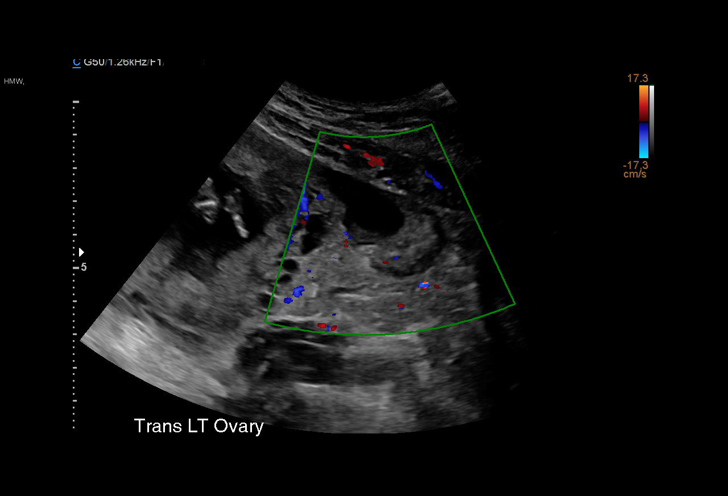

[15 of 28 positions shown; findings below may reference images not displayed]

FINDINGS: Intrauterine gestational sac: Single

Yolk sac:  Not Visualized.

Embryo:  Visualized.

Cardiac Activity: Visualized.

Heart Rate: 133 bpm

CRL:   82 mm   14 w 1 d                  US EDC: 02/07/2021

Subchorionic hemorrhage:  None visualized.

Maternal uterus/adnexae: Left ovary is normal in appearance. Right
ovary is not directly visualized, however no adnexal mass
identified. No evidence of free fluid.
IMPRESSION: Single living IUP with estimated gestational age of 14 weeks 1 day,
and US EDC of 02/07/2021.

No maternal uterine or adnexal abnormality identified.

## 2022-12-31 ENCOUNTER — Ambulatory Visit: Payer: BC Managed Care – PPO

## 2022-12-31 DIAGNOSIS — F411 Generalized anxiety disorder: Secondary | ICD-10-CM | POA: Diagnosis not present

## 2023-01-02 ENCOUNTER — Ambulatory Visit (INDEPENDENT_AMBULATORY_CARE_PROVIDER_SITE_OTHER): Payer: BC Managed Care – PPO | Admitting: Medical

## 2023-01-02 VITALS — BP 124/80 | HR 81 | Temp 97.2°F | Wt 232.0 lb

## 2023-01-02 DIAGNOSIS — R2231 Localized swelling, mass and lump, right upper limb: Secondary | ICD-10-CM | POA: Diagnosis not present

## 2023-01-02 NOTE — Progress Notes (Signed)
Subjective:  Mary Gibbs is a 37 y.o. female who presents for Chief Complaint  Patient presents with   knot under arm    Knot under or right arm x 1 week     She noticed a knot in right armpit a week ago.   Just happened to feel it .  Nontender.  No other breast lump.  No warmth or redness.  No other aggravating or relieving factors.    No other c/o.  Past Medical History:  Diagnosis Date   Anemia    @ [facility] appt   Depression     no meds currently, was thinking of going to behavior health   H/O hiatal hernia    Hx of ulcer disease    Hx of varicella    Infection    UTI   NSVD (normal spontaneous vaginal delivery) 08/26/2012   Current Outpatient Medications on File Prior to Visit  Medication Sig Dispense Refill   acetaminophen (TYLENOL) 500 MG tablet Take 1 tablet (500 mg total) by mouth every 6 (six) hours as needed (pain). 30 tablet 0   BIOTIN PO Take 1 capsule by mouth daily.     BLACK CURRANT SEED OIL PO Take 2 capsules by mouth daily. Green tea supplement     ibuprofen (ADVIL) 600 MG tablet Take 1 tablet (600 mg total) by mouth every 6 (six) hours as needed. 30 tablet 0   polyethylene glycol powder (GLYCOLAX/MIRALAX) 17 GM/SCOOP powder Take 17 g by mouth daily. Drink 17g (1 scoop) dissolved in water per day. 255 g 0   Probiotic Product (ALIGN) 4 MG CAPS Take 1 capsule (4 mg total) by mouth daily. 30 capsule 2   No current facility-administered medications on file prior to visit.   Family History  Problem Relation Age of Onset   Epilepsy Sister    Depression Sister    Bipolar disorder Sister    Hypertension Mother    Tuberculosis Mother        as a child   Diabetes Mother        diet controlled   Depression Mother    Cancer Father        prostate   Colon cancer Paternal Grandfather    Cancer Paternal Grandfather        prostate   Breast cancer Maternal Aunt        x 3;after menopause   Diabetes Maternal Grandmother    Hypertension Maternal Grandmother     Stomach cancer Paternal Grandmother    Heart disease Neg Hx      The following portions of the patient's history were reviewed and updated as appropriate: allergies, current medications, past family history, past medical history, past social history, past surgical history and problem list.  ROS Otherwise as in subjective above  Objective: BP 124/80   Pulse 81   Temp (!) 97.2 F (36.2 C)   Wt 232 lb (105.2 kg)   BMI 43.84 kg/m   General appearance: alert, no distress, well developed, well nourished There is a fullness to the right axilla compared to the left and there is a questionable not subcutaneous or maybe even deeper in the right axilla less than 5 mm possible lymph node palpable but nontender, some mild fibrocystic tissue of both breast. No other worrisome findings of the breast or axilla Exam chaperoned by nurse  Assessment: Encounter Diagnosis  Name Primary?   Lump in armpit, right Yes     Plan: We discussed symptoms  and findings.  There is a general fullness of the right axilla is could suggest sweat glands getting inflamed but no obvious mass or lymphadenopathy  Advise warm compresses several times a day through the next 4 to 5 days.  If things resolved and no worries.  However if signs of infection such as redness swelling and pain, then get reevaluated  If no change will actually more prominent not her lymph node or breast lump over the next week or 2 then we will need to either pursue imaging or she can follow-up with her gynecologist in the event to have imaging in house  If she calls back within the next week or 2 was concerned that this lump is more noticeable we will pursue right breast ultrasound and diagnostic mammography  Mary Gibbs was seen today for knot under arm.  Diagnoses and all orders for this visit:  Lump in armpit, right    Follow up: prn

## 2023-01-08 DIAGNOSIS — F411 Generalized anxiety disorder: Secondary | ICD-10-CM | POA: Diagnosis not present

## 2023-01-28 DIAGNOSIS — F411 Generalized anxiety disorder: Secondary | ICD-10-CM | POA: Diagnosis not present

## 2023-02-10 ENCOUNTER — Other Ambulatory Visit: Payer: Self-pay | Admitting: Medical

## 2023-02-10 MED ORDER — ZEPBOUND 2.5 MG/0.5ML ~~LOC~~ SOAJ: 2.5000 mg | SUBCUTANEOUS | 0 refills | Status: DC

## 2023-02-12 DIAGNOSIS — F411 Generalized anxiety disorder: Secondary | ICD-10-CM | POA: Diagnosis not present

## 2023-02-16 ENCOUNTER — Telehealth: Payer: Self-pay

## 2023-02-16 NOTE — Telephone Encounter (Signed)
Mary Plowman PA Case ID #: [id number] Rx #: [id number] Status: sent iconSent to Plan today Drug: Zepbound 2.5MG /0.5ML pen-injectors Form: [employer] Electronic Request Form

## 2023-02-20 NOTE — Telephone Encounter (Signed)
KeyLeory Plowman PA Case ID #: [id number] Rx #: [id number] Outcome: Approved on September 18 by [facility] [employer] [facility] 2017 Authorization Expiration Date: 06/22/2023 Drug: Zepbound 2.5MG /0.5ML pen-injectors Form: [employer]  Commercial Electronic Request Form

## 2023-02-20 NOTE — Telephone Encounter (Signed)
Pt notified via phone. Approval faxed to pharmacy.

## 2023-02-24 ENCOUNTER — Other Ambulatory Visit (HOSPITAL_COMMUNITY): Payer: Self-pay

## 2023-02-24 ENCOUNTER — Telehealth: Payer: Self-pay | Admitting: Medical

## 2023-02-24 MED ORDER — ZEPBOUND 2.5 MG/0.5ML ~~LOC~~ SOAJ
2.5000 mg | SUBCUTANEOUS | 0 refills | Status: DC
  Filled 2023-02-24: qty 2, 28d supply, fill #0

## 2023-02-24 NOTE — Telephone Encounter (Signed)
done

## 2023-02-24 NOTE — Telephone Encounter (Signed)
Pt needs her zepbound to be sent to [facility] instead please.

## 2023-03-04 DIAGNOSIS — F411 Generalized anxiety disorder: Secondary | ICD-10-CM | POA: Diagnosis not present

## 2023-03-16 ENCOUNTER — Other Ambulatory Visit: Payer: Self-pay | Admitting: Medical

## 2023-03-16 MED ORDER — ZEPBOUND 7.5 MG/0.5ML ~~LOC~~ SOAJ: 7.5000 mg | SUBCUTANEOUS | 0 refills | Status: DC

## 2023-03-16 MED ORDER — ZEPBOUND 5 MG/0.5ML ~~LOC~~ SOAJ: 5.0000 mg | SUBCUTANEOUS | 0 refills | Status: DC

## 2023-03-18 DIAGNOSIS — F411 Generalized anxiety disorder: Secondary | ICD-10-CM | POA: Diagnosis not present

## 2023-04-01 DIAGNOSIS — F411 Generalized anxiety disorder: Secondary | ICD-10-CM | POA: Diagnosis not present

## 2023-05-07 ENCOUNTER — Ambulatory Visit (INDEPENDENT_AMBULATORY_CARE_PROVIDER_SITE_OTHER): Payer: BC Managed Care – PPO | Admitting: Medical

## 2023-05-07 VITALS — BP 110/70 | HR 85 | Wt 227.4 lb

## 2023-05-07 DIAGNOSIS — R7301 Impaired fasting glucose: Secondary | ICD-10-CM | POA: Diagnosis not present

## 2023-05-07 DIAGNOSIS — Z111 Encounter for screening for respiratory tuberculosis: Secondary | ICD-10-CM | POA: Diagnosis not present

## 2023-05-07 DIAGNOSIS — Z6841 Body Mass Index (BMI) 40.0 and over, adult: Secondary | ICD-10-CM

## 2023-05-07 DIAGNOSIS — Z566 Other physical and mental strain related to work: Secondary | ICD-10-CM

## 2023-05-07 MED ORDER — ZEPBOUND 10 MG/0.5ML ~~LOC~~ SOAJ: 10.0000 mg | SUBCUTANEOUS | 0 refills | Status: DC

## 2023-05-07 NOTE — Progress Notes (Signed)
Subjective:  Mary Gibbs is a 37 y.o. female who presents for Chief Complaint  Patient presents with   Follow-up    Follow-up on weight loss, needs quantiferon for school     Here for medication management, weight loss medications.    Exercise - 3-4 days per week, exercise classes.   No side effects reported with medicaiton.    Feels like the zepbond really helps with efforts to lose weight  Dealing with a lot of stress currently.  Has asked for vacation time with work but they have declined currently.  Feels overwhelmed with work, school, stressors in general.   Working as a Engineer, civil (consulting), [employer], in nursing school.  In school full time, will be done in May 2025.    Husband's job schedule is 12 - 9pm, and this puts all the stress on her to take care of the kids and go to school and work.  Working full time as well.     She needs lab for school.  No other aggravating or relieving factors.    No other c/o.  Past Medical History:  Diagnosis Date   Anemia    @ [facility] appt   Depression     no meds currently, was thinking of going to behavior health   H/O hiatal hernia    Hx of ulcer disease    Hx of varicella    Infection    UTI   NSVD (normal spontaneous vaginal delivery) 08/26/2012   Current Outpatient Medications on File Prior to Visit  Medication Sig Dispense Refill   acetaminophen (TYLENOL) 500 MG tablet Take 1 tablet (500 mg total) by mouth every 6 (six) hours as needed (pain). 30 tablet 0   BIOTIN PO Take 1 capsule by mouth daily.     BLACK CURRANT SEED OIL PO Take 2 capsules by mouth daily. Green tea supplement     ibuprofen (ADVIL) 600 MG tablet Take 1 tablet (600 mg total) by mouth every 6 (six) hours as needed. 30 tablet 0   polyethylene glycol powder (GLYCOLAX/MIRALAX) 17 GM/SCOOP powder Take 17 g by mouth daily. Drink 17g (1 scoop) dissolved in water per day. 255 g 0   Probiotic Product (ALIGN) 4 MG CAPS Take 1 capsule (4 mg total) by mouth daily. 30 capsule 2    No current facility-administered medications on file prior to visit.    The following portions of the patient's history were reviewed and updated as appropriate: allergies, current medications, past family history, past medical history, past social history, past surgical history and problem list.  ROS Otherwise as in subjective above    Objective: BP 110/70   Pulse 85   Wt 227 lb 6.4 oz (103.1 kg)   BMI 42.97 kg/m   Wt Readings from Last 3 Encounters:  05/07/23 227 lb 6.4 oz (103.1 kg)  01/02/23 232 lb (105.2 kg)  09/03/22 231 lb 6.4 oz (105 kg)   General appearance: alert, no distress, well developed, well nourished Psych: pleasant, tearful at time, answers questions appropriately    Assessment: Encounter Diagnoses  Name Primary?   Impaired fasting blood sugar Yes   BMI 40.0-44.9, adult (HCC)    Screening for tuberculosis    Stress at work       Plan: Impaired glucose-continue rest to lose weight through healthy diet and exercise.  Labs for tuberculosis screening today for school  Weight management-finish out 2 more weeks of the 7.5 mg Zepbound then chang to the 10  mg.  Counseled on healthy diet and exercise.  Stress at work-she is seeing a Veterinary surgeon.  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   [place] was seen today for follow-up.  Diagnoses and all orders for this visit:  Impaired fasting blood sugar  BMI 40.0-44.9, adult (HCC)  Screening for tuberculosis -     QuantiFERON-TB Gold Plus  Stress at work  Other orders -     tirzepatide (ZEPBOUND) 10 MG/0.5ML Pen; Inject 10 mg into the skin once a week.   Follow up 2 months

## 2023-05-10 LAB — QUANTIFERON-TB GOLD PLUS
QuantiFERON Mitogen Value: 7.86 [IU]/mL
QuantiFERON Nil Value: 0 [IU]/mL
QuantiFERON TB1 Ag Value: 0.01 [IU]/mL
QuantiFERON TB2 Ag Value: 0.01 [IU]/mL

## 2023-05-11 NOTE — Progress Notes (Signed)
Results sent through MyChart

## 2023-05-14 DIAGNOSIS — F411 Generalized anxiety disorder: Secondary | ICD-10-CM | POA: Diagnosis not present

## 2023-06-16 ENCOUNTER — Other Ambulatory Visit (HOSPITAL_COMMUNITY): Payer: Self-pay

## 2023-06-16 ENCOUNTER — Telehealth: Payer: Self-pay

## 2023-06-16 NOTE — Telephone Encounter (Signed)
 Pharmacy Patient Advocate Encounter   Received notification from [employer] Portal that prior authorization for Zepbound  is required/requested.   Insurance verification completed.   The patient is insured through [facility] .   Per test claim: PA required; PA submitted to above mentioned insurance via Prompt PA Key/confirmation #/EOC   Status is pending

## 2023-06-18 ENCOUNTER — Other Ambulatory Visit (HOSPITAL_COMMUNITY): Payer: Self-pay

## 2023-06-18 NOTE — Telephone Encounter (Signed)
Pharmacy Patient Advocate Encounter  Received notification from [employer] that Prior Authorization for Zepbound has been APPROVED from 1.15.25 to 1.14.26. Ran test claim, Copay is $25.00. This test claim was processed through [facility]- copay amounts may vary at other pharmacies due to pharmacy/plan contracts, or as the patient moves through the different stages of their insurance plan.

## 2023-07-10 ENCOUNTER — Ambulatory Visit: Payer: BC Managed Care – PPO | Admitting: Medical

## 2023-07-10 VITALS — BP 120/80 | HR 93 | Wt 223.8 lb

## 2023-07-10 DIAGNOSIS — R7301 Impaired fasting glucose: Secondary | ICD-10-CM | POA: Diagnosis not present

## 2023-07-10 DIAGNOSIS — Z6841 Body Mass Index (BMI) 40.0 and over, adult: Secondary | ICD-10-CM

## 2023-07-10 MED ORDER — ONDANSETRON 4 MG PO TBDP: 4.0000 mg | ORAL_TABLET | Freq: Three times a day (TID) | ORAL | 0 refills | Status: AC | PRN

## 2023-07-10 MED ORDER — ZEPBOUND 15 MG/0.5ML ~~LOC~~ SOAJ: 15.0000 mg | SUBCUTANEOUS | 1 refills | Status: DC

## 2023-07-10 MED ORDER — ZEPBOUND 12.5 MG/0.5ML ~~LOC~~ SOAJ: 12.5000 mg | SUBCUTANEOUS | 0 refills | Status: DC

## 2023-07-10 NOTE — Progress Notes (Signed)
 Subjective:  Mary Gibbs is a 38 y.o. female who presents for Chief Complaint  Patient presents with   follow-up    Follow-up on weight      Here for medication management, weight loss medications.    Using zepbound  10mg  weekly for past month.  Exercise:  3 days per week, exercise classes.   Sometimes gets a little nausea with zepbound   Initial weight 237lb 07/2022 when  wegovy  was initiated.   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  Doing better in this regard.  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  No other aggravating or relieving factors.    No other c/o.  Past Medical History:  Diagnosis Date   Anemia    @ last [facility] appt   Depression     no meds currently, was thinking of going to behavior health   H/O hiatal hernia    Hx of ulcer disease    Hx of varicella    Infection    UTI   NSVD (normal spontaneous vaginal delivery) 08/26/2012   Current Outpatient Medications on File Prior to Visit  Medication Sig Dispense Refill   BIOTIN PO Take 1 capsule by mouth daily.     BLACK CURRANT SEED OIL PO Take 2 capsules by mouth daily. Green tea supplement     ibuprofen  (ADVIL ) 600 MG tablet Take 1 tablet (600 mg total) by mouth every 6 (six) hours as needed. 30 tablet 0   polyethylene glycol powder (GLYCOLAX /MIRALAX ) 17 GM/SCOOP powder Take 17 g by mouth daily. Drink 17g (1 scoop) dissolved in water per day. 255 g 0   Probiotic Product (ALIGN) 4 MG CAPS Take 1 capsule (4 mg total) by mouth daily. 30 capsule 2   tirzepatide  (ZEPBOUND ) 10 MG/0.5ML Pen Inject 10 mg into the skin once a week. 2 mL 0   acetaminophen  (TYLENOL ) 500 MG tablet Take 1 tablet (500 mg total) by mouth every 6 (six) hours as needed (pain). 30 tablet 0   No current facility-administered medications on file prior to visit.    The following portions of the patient's history were reviewed and updated as appropriate:  allergies, current medications, past family history, past medical history, past social history, past surgical history and problem list.  ROS Otherwise as in subjective above    Objective: BP 120/80   Pulse 93   Wt 223 lb 12.8 oz (101.5 kg)   BMI 42.29 kg/m   Wt Readings from Last 3 Encounters:  07/10/23 223 lb 12.8 oz (101.5 kg)  05/07/23 227 lb 6.4 oz (103.1 kg)  01/02/23 232 lb (105.2 kg)   General appearance: alert, no distress, well developed, well nourished Psych: pleasant, tearful at time, answers questions appropriately    Assessment: Encounter Diagnoses  Name Primary?   Impaired fasting blood sugar Yes   BMI 40.0-44.9, adult (HCC)       Plan: Impaired glucose-continue efforts to lose weight through healthy diet and exercise.  Weight management-increase to the 12.5mg  Zepbound  for a month, then go to highest dose 15mg .  Counseled on healthy diet and exercise.  Can use zofran  for nausea.    Stress at work-improved in past month after change of employment.   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     Lititia was seen today for follow-up.  Diagnoses and all orders for this visit:  Impaired fasting  blood sugar  BMI 40.0-44.9, adult (HCC)  Other orders -     tirzepatide  (ZEPBOUND ) 12.5 MG/0.5ML Pen; Inject 12.5 mg into the skin once a week. -     tirzepatide  (ZEPBOUND ) 15 MG/0.5ML Pen; Inject 15 mg into the skin once a week. -     ondansetron  (ZOFRAN -ODT) 4 MG disintegrating tablet; Take 1 tablet (4 mg total) by mouth every 8 (eight) hours as needed for nausea or vomiting.    Follow up 2 months

## 2023-09-11 ENCOUNTER — Telehealth: Payer: Self-pay | Admitting: Medical

## 2023-09-11 NOTE — Telephone Encounter (Signed)
 Pt came in and asks if you would take her husband Mary Gibbs and son Mary Gibbs (39 yr old) on as new patients. She says she wants to keep the family in one office. I did tell her you will be out of the office next and that you would reply when you return.

## 2023-09-22 ENCOUNTER — Telehealth: Payer: Self-pay | Admitting: Medical

## 2023-09-22 ENCOUNTER — Other Ambulatory Visit: Payer: Self-pay | Admitting: Medical

## 2023-09-22 MED ORDER — ZEPBOUND 12.5 MG/0.5ML ~~LOC~~ SOAJ: 12.5000 mg | SUBCUTANEOUS | 0 refills | Status: DC

## 2023-09-22 NOTE — Telephone Encounter (Signed)
 Mary Gibbs is asking if she can go back to 12.5 with her Zepbound  due to the 15 has been making her feel sick more than she can handle. She also states she needs a refill.

## 2023-10-13 ENCOUNTER — Telehealth: Payer: Self-pay | Admitting: Internal Medicine

## 2023-10-13 NOTE — Telephone Encounter (Unsigned)
 Copied from CRM [id number]. Topic: Clinical - Medication Question >> Oct 13, 2023  2:19 PM Donald Frost wrote: Reason for CRM: The patient called in inquiring if she can switch back now from the tirzepatide  (ZEPBOUND ) 12.5 MG/0.5ML Pen to the 15 MG? She states in hindsight it may have just been something she ate that made her sick to her stomach. Please assist patient further and if the 15MG  can be called into  [id number] [place],  - [address]  Phone: [phone] Fax: [phone]

## 2023-10-14 ENCOUNTER — Other Ambulatory Visit: Payer: Self-pay | Admitting: Medical

## 2023-10-14 MED ORDER — TIRZEPATIDE-WEIGHT MANAGEMENT 15 MG/0.5ML ~~LOC~~ SOAJ: 15.0000 mg | SUBCUTANEOUS | 0 refills | Status: DC

## 2023-11-16 ENCOUNTER — Other Ambulatory Visit: Payer: Self-pay | Admitting: Medical

## 2023-11-16 MED ORDER — TIRZEPATIDE-WEIGHT MANAGEMENT 15 MG/0.5ML ~~LOC~~ SOAJ: 15.0000 mg | SUBCUTANEOUS | 0 refills | Status: DC

## 2023-11-16 NOTE — Telephone Encounter (Signed)
 Copied from CRM [id number]. Topic: Clinical - Medication Refill >> Nov 16, 2023  1:43 PM Zipporah Him wrote: Medication: tirzepatide  (ZEPBOUND ) 15 MG/0.5ML Pen  Has the patient contacted their pharmacy? Yes (Agent: If no, request that the patient contact the pharmacy for the refill. If patient does not wish to contact the pharmacy document the reason why and proceed with request.) (Agent: If yes, when and what did the pharmacy advise?)  This is the patient's preferred pharmacy:  [id number] [place], [place] - [address] [place] [address] Phone: [phone] Fax: [phone]   Is this the correct pharmacy for this prescription? Yes If no, delete pharmacy and type the correct one.   Has the prescription been filled recently? No  Is the patient out of the medication? Yes, wasted a pen so out  Has the patient been seen for an appointment in the last year OR does the patient have an upcoming appointment? Yes  Can we respond through MyChart? Yes  Agent: Please be advised that Rx refills may take up to 3 business days. We ask that you follow-up with your pharmacy.

## 2023-11-21 DIAGNOSIS — Z9049 Acquired absence of other specified parts of digestive tract: Secondary | ICD-10-CM | POA: Diagnosis not present

## 2023-11-21 DIAGNOSIS — R1013 Epigastric pain: Secondary | ICD-10-CM | POA: Diagnosis not present

## 2023-11-21 DIAGNOSIS — R109 Unspecified abdominal pain: Secondary | ICD-10-CM | POA: Diagnosis not present

## 2023-11-21 DIAGNOSIS — R110 Nausea: Secondary | ICD-10-CM | POA: Diagnosis not present

## 2023-11-21 NOTE — ED Provider Notes (Signed)
 [facility]  ED Provider Note Patient was seen and received a medical screening examination in triage appropriate for the chief complaint of epigastric pain with nausea since yesterday.  PMH: reviewed VS: reviewed  Plan: Appropriate orders, if needed, have been initiated based on my screening examination; the patient will receive further evaluation on bed assignment and main ED provider evaluation. Ryan Ivonne PA-C   Chandell Attridge 38 y.o. female DOB: [birth year] MRN: [id number] History   Chief Complaint  Patient presents with  . Abdominal Pain    States sharp mid abdominal pain with nausea since Friday morning after ultrasonic cavitation.    38 yo female, had ultrasonic cavitation mid day on Thursday, states early Friday began having epigastric pain, doesn't radiate into chest, no vomiting or diarrhea, taking a laxative that seems to be helping, no fevers, had prior cholecystectomy   History provided by:  Patient      No past medical history on file.  No past surgical history on file.  Social History   Substance and Sexual Activity  Alcohol Use Not on file   Tobacco Use History[1] No existing history information found. Social History   Substance and Sexual Activity  Drug Use Not on file         Allergies[2]  Home Medications   No medications on file    Primary Survey  Primary Survey  Review of Systems   Review of Systems  Gastrointestinal:  Positive for abdominal pain.  All other systems reviewed and are negative.   Physical Exam   ED Triage Vitals [11/21/23 1253]  BP 119/85  Heart Rate 102  Resp 18  SpO2 97 %  Temp 98.2 F (36.8 C)    Physical Exam  Nursing note and vitals reviewed. Constitutional: She appears well-developed and well-nourished. She does not appear distressed and no respiratory distress.  HENT:  Head: Normocephalic and atraumatic.  Nose: Nose normal.  Eyes: EOM are intact. Pupils are equal, round,  and reactive to light.  Neck: Normal range of motion.  Cardiovascular: Normal rate, regular rhythm and normal heart sounds.  Pulmonary/Chest: No respiratory distress. Breath sounds normal.  Abdominal: Soft. There is abdominal tenderness. There is no guarding and no rebound. Abdomen not distended. Bowel sounds are normal.  Musculoskeletal:     Cervical back: Normal range of motion. No bony tenderness.   Lymphadenopathy:    No cervical adenopathy.  Neurological: She is alert and oriented to person, place, and time. Cranial nerves intact II through XII.  CN II - XII intact  Skin: Skin is warm. Skin is dry.  Psychiatric: She has a normal mood and affect.     ED Course   Lab results:   CBC AND DIFFERENTIAL - Abnormal      Result Value   WBC 9.0     RBC 4.11     HGB 12.6     HCT 39.1     MCV 95.1 (*)    MCH 30.7     MCHC 32.2     Plt Ct 332     RDW SD 46.9 (*)    MPV 10.4     NRBC% 0.0     Absolute NRBC Count 0.00     NEUTROPHIL % 72.8     LYMPHOCYTE % 18.8     MONOCYTE % 6.3     Eosinophil % 1.7     BASOPHIL % 0.2     IG% 0.2     ABSOLUTE NEUTROPHIL  COUNT 6.53     ABSOLUTE LYMPHOCYTE COUNT 1.69     Absolute Monocyte Count 0.57     Absolute Eosinophil Count 0.15     Absolute Basophil Count 0.02     Absolute Immature Granulocyte Count 0.02    URINALYSIS W/MICRO REFLEX CULTURE - SYMPTOMATIC - Abnormal   Urine Color Yellow     Urine Clarity Clear     Urine Specific Gravity 1.029     Urine pH 5.5     Urine Protein - Dipstick 10 (*)    Urine Glucose Negative     Urine Ketones Negative     Urine Bilirubin Negative     Urine Blood 0.2 (*)    Urine Urobilinogen <2     Urine Nitrite Negative     Urine Leukocyte Esterase Negative     Urine Squamous Epithelial Cells 1     Urine WBC 1     Urine RBC 3 (*)    Urine Mucous Many (*)    UA Microscopic Yes Micro (*)    Narrative:    Does not meet criteria for reflex to Urine Culture.  COMPREHENSIVE METABOLIC PANEL - Normal    Na 863     Potassium 3.9     Cl 104     CO2 22     AGAP 10     Glucose 91     BUN 11     Creatinine 0.80     Ca 9.0     ALK PHOS 53     T Bili 0.3     Total Protein 7.4     Alb 4.0     GLOBULIN 3.4     ALBUMIN/GLOBULIN RATIO 1.2     BUN/CREAT RATIO 13.8     ALT 11     AST 14     eGFR 97     Comment: Normal GFR (glomerular filtration rate) > 60 mL/min/1.73 meters squared, < 60 may include impaired kidney function. Calculation based on the Chronic Kidney Disease Epidemiology Collaboration (CK-EPI)equation refit without adjustment for race.  LIPASE - Normal   Lipase 28    HCG, URINE, QUALITATIVE - Normal   U BETA HCG QUAL Negative    CK - Normal   CK 64      Imaging:   CT ABDOMEN PELVIS W IV CONTRAST   Narrative:    EXAM: CT ABDOMEN PELVIS W IV CONTRAST DATE: 11/21/2023 2:34 PM ACCESSION: [id number] DICTATED: 11/21/2023 3:18 PM  CLINICAL INDICATION: Abdominal Pain    COMPARISON: None  TECHNIQUE: A spiral CT scan of the abdomen and pelvis  was obtained after the intravenous administration of  75 mL of IV  Isovue  370 iodinated contrast from the lung bases through the pubic symphysis. Images were reconstructed in the axial plane. Coronal and sagittal reformatted images were also provided for further evaluation.  FINDINGS:   LOWER THORAX:  Lung bases are clear. No pericardial effusion. .  ABDOMEN/PELVIS: The liver, spleen, adrenal glands and pancreas are unremarkable. Cholecystectomy.  Symmetric nephrograms. No obstruction or abnormal enhancement. No calculi. Latter decompressed.  Unremarkable uterus. No adnexal mass.  No free fluid or free air.  No bowel obstruction or acute inflammation. Fluid throughout the large bowel which can be seen with diarrhea.  Nonspecific prominence of central mesenteric nodes. No AAA. Unremarkable IVC. The portal vein, splenic vein, and SMV are patent.  BONES AND SOFT TISSUES:  No aggressive osseous lesions.      Impression:  IMPRESSION: 1. Fluid in the large bowel which can be seen with diarrhea. 2. Nonspecific prominence of central mesenteric nodes most commonly seen with adenitis or enteritis.   Electronically Signed by: Cheryle Cork, MD on 11/21/2023 3:29 PM      ECG: ECG Results   None                                                                     Pre-Sedation Procedures    Medical Decision Making The differential diagnosis associated with the patient's presentation includes: pancreatitis, don't suspect bowel perforation, doubt bowel obstruction, symptoms don't favor ACS, colitis, diverticulitis  I considered admitting/observing and elected not to, well appearing, discussed at home treatments heating pads, tylenol , rest  The following testing was considered but ultimately not selected after discussion with patient/family. Doesn't need endoscopy  Prescription medication was considered but ultimately not given after discussion with patient/family (e.g., pain medication, antiviral, antibiotic) : pain medication  Patient's care significantly limited by social determinants of health including : Other social determinant of health. Recent aesthetician procedure.    Amount and/or Complexity of Data Reviewed Labs: ordered. Decision-making details documented in ED Course. Radiology: ordered and independent interpretation performed. Decision-making details documented in ED Course.  Risk OTC drugs. Prescription drug management.           Provider Communication  New Prescriptions   No medications on file    Modified Medications   No medications on file    Discontinued Medications   No medications on file    Clinical Impression Final diagnoses:  Abdominal pain, unspecified abdominal location    ED Disposition     ED Disposition  Discharge   Condition  Stable   Comment  --                 Follow-up Information      [facility].   Specialty: Emergency Medicine Contact information: [address] [place]  [address] [phone]                 Electronically signed by:   Franky GORMAN Desanctis, MD 11/21/23 1542     [1] Social History Tobacco Use  Smoking Status Not on file  Smokeless Tobacco Not on file  [2] No Known Allergies

## 2023-11-26 ENCOUNTER — Ambulatory Visit: Admitting: Medical

## 2023-12-01 ENCOUNTER — Ambulatory Visit: Admitting: Medical

## 2023-12-01 ENCOUNTER — Other Ambulatory Visit (HOSPITAL_COMMUNITY): Payer: Self-pay

## 2023-12-01 VITALS — BP 110/72 | HR 79 | Ht 61.0 in | Wt 210.8 lb

## 2023-12-01 DIAGNOSIS — R7301 Impaired fasting glucose: Secondary | ICD-10-CM | POA: Diagnosis not present

## 2023-12-01 DIAGNOSIS — Z6839 Body mass index (BMI) 39.0-39.9, adult: Secondary | ICD-10-CM

## 2023-12-01 MED ORDER — ZEPBOUND 12.5 MG/0.5ML ~~LOC~~ SOAJ
12.5000 mg | SUBCUTANEOUS | 1 refills | Status: DC
  Filled 2023-12-01 – 2024-01-04 (×2): qty 2, 28d supply, fill #0
  Filled 2024-01-31: qty 2, 28d supply, fill #1

## 2023-12-01 MED ORDER — ZEPBOUND 10 MG/0.5ML ~~LOC~~ SOAJ
10.0000 mg | SUBCUTANEOUS | 0 refills | Status: DC
  Filled 2023-12-01 – 2023-12-10 (×4): qty 2, 28d supply, fill #0

## 2023-12-01 NOTE — Progress Notes (Signed)
 Subjective:  Mary Gibbs is a 38 y.o. female who presents for Chief Complaint  Patient presents with   Medical Management of Chronic Issues    Med check on medications. No concerns     Here for medication management, weight loss medications.    She has been on Zepbound  15 mg for the past 2 months except has had issues at the pharmacy getting the medication.  That she has been out for the medicine for about 3 weeks.  She uses [facility]  She has had success on the medication.  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  So she was unable to stay as much on track with exercise and diet in June.  Nevertheless she did get down to 205 pounds in the last month.  Current weight today 210 pounds.  She did get some nausea on the 15 milligrams Zepbound .  Uses Zofran  when needed.  Lately not having to use MiraLAX  for constipation as she has had on and off.  She just got a job with [employer].  She is trying to get down to 190 pounds  No other aggravating or relieving factors.    No other c/o.  Past Medical History:  Diagnosis Date   Anemia    @ last [facility] appt   Depression     no meds currently, was thinking of going to behavior health   H/O hiatal hernia    Hx of ulcer disease    Hx of varicella    Infection    UTI   NSVD (normal spontaneous vaginal delivery) 08/26/2012   Current Outpatient Medications on File Prior to Visit  Medication Sig Dispense Refill   acetaminophen  (TYLENOL ) 500 MG tablet Take 1 tablet (500 mg total) by mouth every 6 (six) hours as needed (pain). 30 tablet 0   BIOTIN PO Take 1 capsule by mouth daily.     BLACK CURRANT SEED OIL PO Take 2 capsules by mouth daily. Green tea supplement     ibuprofen  (ADVIL ) 600 MG tablet Take 1 tablet (600 mg total) by mouth every 6 (six) hours as needed. 30 tablet 0    ondansetron  (ZOFRAN -ODT) 4 MG disintegrating tablet Take 1 tablet (4 mg total) by mouth every 8 (eight) hours as needed for nausea or vomiting. 30 tablet 0   polyethylene glycol powder (GLYCOLAX /MIRALAX ) 17 GM/SCOOP powder Take 17 g by mouth daily. Drink 17g (1 scoop) dissolved in water per day. 255 g 0   Probiotic Product (ALIGN) 4 MG CAPS Take 1 capsule (4 mg total) by mouth daily. 30 capsule 2   tirzepatide  (ZEPBOUND ) 15 MG/0.5ML Pen Inject 15 mg into the skin once a week. 2 mL 0   No current facility-administered medications on file prior to visit.    The following portions of the patient's history were reviewed and updated as appropriate: allergies, current medications, past family history, past medical history, past social history, past surgical history and problem list.  ROS Otherwise as in subjective above    Objective: BP 110/72   Pulse 79   Ht 5' 1 (1.549 m)   Wt 210 lb 12.8 oz (95.6 kg)   SpO2 98%   BMI 39.83 kg/m   Wt Readings from Last 3 Encounters:  12/01/23 210 lb 12.8 oz (95.6 kg)  07/10/23 223  lb 12.8 oz (101.5 kg)  05/07/23 227 lb 6.4 oz (103.1 kg)    General appearance: alert, no distress, well developed, well nourished Psych: pleasant, good eye contact, answers questions appropriately Lungs clear Heart regular rate rhythm, normal S1-S2, no murmurs     Assessment: Encounter Diagnoses  Name Primary?   Impaired fasting blood sugar Yes   BMI 39.0-39.9,adult       Plan: Impaired glucose-continue efforts to lose weight through healthy diet and exercise.  Weight management-congratulated her on her efforts to lose weight.  She is trying to get down to 190 pounds as a next goal.  Counseled on diet, exercise, medication.  Since she has been off the medicine 3 weeks because of pharmacy issues we will drop back down to Zepbound  10 mg for 1 month, then 12.5 mg for the next month.  She will call back before she runs out to let me know when ready to go back at  the 15 mg Zepbound .   Aspyn was seen today for medical management of chronic issues.  Diagnoses and all orders for this visit:  Impaired fasting blood sugar  BMI 39.0-39.9,adult  Other orders -     tirzepatide  (ZEPBOUND ) 10 MG/0.5ML Pen; Inject 10 mg into the skin once a week. -     tirzepatide  (ZEPBOUND ) 12.5 MG/0.5ML Pen; Inject 12.5 mg into the skin once a week.    Follow up 3 months for fasting well visit

## 2023-12-02 ENCOUNTER — Other Ambulatory Visit (HOSPITAL_COMMUNITY): Payer: Self-pay

## 2023-12-07 ENCOUNTER — Other Ambulatory Visit (HOSPITAL_COMMUNITY): Payer: Self-pay

## 2023-12-10 ENCOUNTER — Other Ambulatory Visit (HOSPITAL_COMMUNITY): Payer: Self-pay

## 2023-12-11 ENCOUNTER — Other Ambulatory Visit (HOSPITAL_COMMUNITY): Payer: Self-pay

## 2024-01-04 ENCOUNTER — Other Ambulatory Visit (HOSPITAL_COMMUNITY): Payer: Self-pay

## 2024-01-08 ENCOUNTER — Other Ambulatory Visit (HOSPITAL_COMMUNITY): Payer: Self-pay

## 2024-01-08 DIAGNOSIS — H2000 Unspecified acute and subacute iridocyclitis: Secondary | ICD-10-CM | POA: Diagnosis not present

## 2024-01-08 MED ORDER — CYCLOPENTOLATE HCL 1 % OP SOLN
1.0000 [drp] | Freq: Two times a day (BID) | OPHTHALMIC | 0 refills | Status: DC
  Filled 2024-01-08: qty 2, 20d supply, fill #0

## 2024-01-08 MED ORDER — PREDNISOLONE ACETATE 1 % OP SUSP
1.0000 [drp] | Freq: Four times a day (QID) | OPHTHALMIC | 0 refills | Status: DC
  Filled 2024-01-08: qty 5, 25d supply, fill #0

## 2024-01-28 DIAGNOSIS — H20011 Primary iridocyclitis, right eye: Secondary | ICD-10-CM | POA: Diagnosis not present

## 2024-02-03 ENCOUNTER — Other Ambulatory Visit (HOSPITAL_COMMUNITY): Payer: Self-pay

## 2024-02-22 ENCOUNTER — Other Ambulatory Visit (HOSPITAL_COMMUNITY): Payer: Self-pay

## 2024-02-22 ENCOUNTER — Other Ambulatory Visit: Payer: Self-pay | Admitting: Medical

## 2024-02-22 MED ORDER — TIRZEPATIDE-WEIGHT MANAGEMENT 15 MG/0.5ML ~~LOC~~ SOAJ
15.0000 mg | SUBCUTANEOUS | 0 refills | Status: DC
  Filled 2024-02-22 – 2024-03-25 (×2): qty 2, 28d supply, fill #0

## 2024-02-22 NOTE — Telephone Encounter (Signed)
 Copied from CRM [id number]. Topic: Clinical - Medication Refill >> Feb 22, 2024 12:05 PM Charlet HERO wrote: Medication: tirzepatide  (ZEPBOUND ) 15 MG/0.5ML Pen  Has the patient contacted their pharmacy? No Has to call in script  This is the patient's preferred pharmacy:   [facility] [address], [address] [place] [address] Phone: [phone] Fax: [phone]  Is this the correct pharmacy for this prescription? Yes If no, delete pharmacy and type the correct one.   Has the prescription been filled recently? Yes  Is the patient out of the medication? Yes  Has the patient been seen for an appointment in the last year OR does the patient have an upcoming appointment? Yes  Can we respond through MyChart? Yes  Agent: Please be advised that Rx refills may take up to 3 business days. We ask that you follow-up with your pharmacy.

## 2024-03-02 ENCOUNTER — Other Ambulatory Visit (HOSPITAL_COMMUNITY): Payer: Self-pay

## 2024-03-09 ENCOUNTER — Encounter: Payer: Self-pay | Admitting: Medical

## 2024-03-09 ENCOUNTER — Ambulatory Visit: Admitting: Medical

## 2024-03-09 ENCOUNTER — Other Ambulatory Visit

## 2024-03-09 VITALS — BP 120/68 | HR 74 | Ht 62.0 in | Wt 198.2 lb

## 2024-03-09 DIAGNOSIS — Z1389 Encounter for screening for other disorder: Secondary | ICD-10-CM

## 2024-03-09 DIAGNOSIS — Z6836 Body mass index (BMI) 36.0-36.9, adult: Secondary | ICD-10-CM

## 2024-03-09 DIAGNOSIS — Z139 Encounter for screening, unspecified: Secondary | ICD-10-CM

## 2024-03-09 DIAGNOSIS — Z13220 Encounter for screening for lipoid disorders: Secondary | ICD-10-CM | POA: Diagnosis not present

## 2024-03-09 DIAGNOSIS — Z136 Encounter for screening for cardiovascular disorders: Secondary | ICD-10-CM

## 2024-03-09 DIAGNOSIS — Z8669 Personal history of other diseases of the nervous system and sense organs: Secondary | ICD-10-CM | POA: Diagnosis not present

## 2024-03-09 DIAGNOSIS — Z0000 Encounter for general adult medical examination without abnormal findings: Secondary | ICD-10-CM | POA: Diagnosis not present

## 2024-03-09 LAB — MICROSCOPIC EXAMINATION

## 2024-03-09 NOTE — Progress Notes (Signed)
 Name: Mary Gibbs   Date of Visit: 03/09/24   Date of last visit with me: 02/22/2024   CHIEF COMPLAINT:  Chief Complaint  Patient presents with   Annual Exam    Cpe, had labs drawn this morning. No concerns       HPI:  Discussed the use of AI scribe software for clinical note transcription with the patient, who gave verbal consent to proceed.  History of Present Illness   Mary Gibbs is a 38 year old female who presents for a well visit and routine blood work.  A year ago, she experienced an accident where a pen flung into her right eye, causing sensitivity to light and pain. She was treated with eye drops, which resolved the symptoms. The same symptoms recurred almost a year later.  Eye doctor recommended screening for autoimmune issues.  She is planning to visit her OBGYN next month for a Pap smear.  She does not smoke, consumes alcohol occasionally, and denies drug use. 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  She exercises regularly.      Reviewed their medical, surgical, family, social, medication, and allergy history and updated chart as appropriate.  No Known Allergies  Past Medical History:  Diagnosis Date   Anemia    @ last [facility] appt   Depression     no meds currently, was thinking of going to behavior health   H/O hiatal hernia    Hx of ulcer disease    Hx of varicella    Infection    UTI   NSVD (normal spontaneous vaginal delivery) 08/26/2012     Current Outpatient Medications:    acetaminophen  (TYLENOL ) 500 MG tablet, Take 1 tablet (500 mg total) by mouth every 6 (six) hours as needed (pain)., Disp: 30 tablet, Rfl: 0   BIOTIN PO, Take 1 capsule by mouth daily., Disp: , Rfl:    BLACK CURRANT SEED OIL PO, Take 2 capsules by mouth daily. Green tea supplement, Disp: , Rfl:    ibuprofen  (ADVIL ) 600 MG tablet, Take 1 tablet (600 mg total) by mouth every 6 (six) hours as needed., Disp: 30 tablet, Rfl: 0    ondansetron  (ZOFRAN -ODT) 4 MG disintegrating tablet, Take 1 tablet (4 mg total) by mouth every 8 (eight) hours as needed for nausea or vomiting., Disp: 30 tablet, Rfl: 0   polyethylene glycol powder (GLYCOLAX /MIRALAX ) 17 GM/SCOOP powder, Take 17 g by mouth daily. Drink 17g (1 scoop) dissolved in water per day., Disp: 255 g, Rfl: 0   Probiotic Product (ALIGN) 4 MG CAPS, Take 1 capsule (4 mg total) by mouth daily., Disp: 30 capsule, Rfl: 2   tirzepatide  (ZEPBOUND ) 15 MG/0.5ML Pen, Inject 15 mg into the skin once a week., Disp: 2 mL, Rfl: 0  Family History  Problem Relation Age of Onset   Epilepsy Sister    Depression Sister    Bipolar disorder Sister    Hypertension Mother    Tuberculosis Mother        as a child   Diabetes Mother        diet controlled   Depression Mother    Cancer Father        prostate   Colon cancer Paternal Grandfather    Cancer Paternal Grandfather        prostate   Breast cancer Maternal Aunt        x 3;after menopause   Diabetes Maternal Grandmother  Hypertension Maternal Grandmother    Stomach cancer Paternal Grandmother    Heart disease Neg Hx     Past Surgical History:  Procedure Laterality Date   BLADDER SUSPENSION N/A 08/04/2022   Procedure: TRANSVAGINAL TAPE (TVT) PROCEDURE;  Surgeon: Marilynne Rosaline SAILOR, MD;  Location[facility];  Service: Gynecology;  Laterality: N/A;   CHOLECYSTECTOMY N/A 06/11/2017   Procedure: LAPAROSCOPIC CHOLECYSTECTOMY WITH INTRAOPERATIVE CHOLANGIOGRAM;  Surgeon: Belinda Cough, MD;  Location: [facility] OR;  Service: General;  Laterality: N/A;   CYSTOSCOPY N/A 08/04/2022   Procedure: CYSTOSCOPY;  Surgeon: Marilynne Rosaline SAILOR, MD;  Location: [facility];  Service: Gynecology;  Laterality: N/A;   FOOT SURGERY Left    broken toe, ligament removed from back of leg   LAPAROSCOPIC TUBAL LIGATION Bilateral 08/04/2022   Procedure: LAPAROSCOPIC TUBAL LIGATION BY SALPINGECTOMY;  Surgeon: Rendell Calton LABOR, DO;   Location: [facility];  Service: Gynecology;  Laterality: Bilateral;   TONSILLECTOMY     As a baby     Review of Systems  Constitutional:  Negative for chills, fever, malaise/fatigue and weight loss.  HENT:  Negative for congestion, ear pain, hearing loss, sore throat and tinnitus.   Eyes:  Negative for blurred vision, pain and redness.       Right eye discomfort  Respiratory:  Negative for cough, hemoptysis and shortness of breath.   Cardiovascular:  Negative for chest pain, palpitations, orthopnea, claudication and leg swelling.  Gastrointestinal:  Negative for abdominal pain, blood in stool, constipation, diarrhea, nausea and vomiting.  Genitourinary:  Negative for dysuria, flank pain, frequency, hematuria and urgency.  Musculoskeletal:  Negative for falls, joint pain and myalgias.  Skin:  Negative for itching and rash.  Neurological:  Negative for dizziness, tingling, speech change, weakness and headaches.  Endo/Heme/Allergies:  Negative for polydipsia. Does not bruise/bleed easily.  Psychiatric/Behavioral:  Negative for depression and memory loss. The patient is not nervous/anxious and does not have insomnia.      OBJECTIVE:    BP 120/68   Pulse 74   Ht 5' 2 (1.575 m)   Wt 198 lb 3.2 oz (89.9 kg)   BMI 36.25 kg/m   BP Readings from Last 3 Encounters:  03/09/24 120/68  12/01/23 110/72  07/10/23 120/80    Wt Readings from Last 3 Encounters:  03/09/24 198 lb 3.2 oz (89.9 kg)  12/01/23 210 lb 12.8 oz (95.6 kg)  07/10/23 223 lb 12.8 oz (101.5 kg)    Physical Exam   General appearance: alert, no distress, WD/WN, African American female Skin: unremarkable HEENT: normocephalic, conjunctiva/corneas normal, sclerae anicteric, PERRLA, EOMi, nares patent, no discharge or erythema, pharynx normal Oral cavity: MMM, tongue normal, teeth normal Neck: supple, no lymphadenopathy, no thyromegaly, no masses, normal ROM, no bruits Chest: non tender, normal shape and  expansion Heart: RRR, normal S1, S2, no murmurs Lungs: CTA bilaterally, no wheezes, rhonchi, or rales Abdomen: +bs, soft, non tender, non distended, no masses, no hepatomegaly, no splenomegaly, no bruits Back: non tender, normal ROM, no scoliosis Musculoskeletal: upper extremities non tender, no obvious deformity, normal ROM throughout, lower extremities non tender, no obvious deformity, normal ROM throughout Extremities: no edema, no cyanosis, no clubbing Pulses: 2+ symmetric, upper and lower extremities, normal cap refill Neurological: alert, oriented x 3, CN2-12 intact, strength normal upper extremities and lower extremities, sensation normal throughout, DTRs 2+ throughout, no cerebellar signs, gait normal Psychiatric: normal affect, behavior normal, pleasant      ASSESSMENT/PLAN:   Encounter Diagnoses  Name Primary?   Encounter for health maintenance examination in adult Yes   Encounter for lipid screening for cardiovascular disease    Screening for hematuria or proteinuria    History of iritis    Screening for condition    BMI 36.0-36.9,adult      Separate significant issues discussed: BMI 36 - continue efforts with weight loss with healthy eating, exercie and zepbound   This visit was a preventative care visit, also known as wellness visit or routine physical.   Topics typically include healthy lifestyle, diet, exercise, preventative care, vaccinations, sick and well care, proper use of emergency dept and after hours care, as well as other concerns.    Hx/o iritis - additional screening labs today  General Recommendations: Continue to return yearly for your annual wellness and preventative care visits.  This gives us  a chance to discuss healthy lifestyle, exercise, vaccinations, review your chart record, and perform screenings where appropriate.  I recommend you see your eye doctor yearly for routine vision care.  I recommend you see your dentist yearly for routine dental  care including hygiene visits twice yearly.   Vaccination  Immunization History  Administered Date(s) Administered   Influenza Split 06/23/2012   Influenza, Seasonal, Injecte, Preservative Fre 06/23/2012   Influenza,inj,Quad PF,6+ Mos 03/16/2022   Influenza-Unspecified 06/23/2012, 03/03/2023   MMR 05/21/2022, 09/11/2022   PFIZER(Purple Top)SARS-COV-2 Vaccination 04/02/2020, 04/23/2020   Tdap 08/27/2012, 12/25/2020    Vaccine recommendations: Flu vaccine  Vaccines administered today: Will get soon with her children   Screening for cancer: Colon cancer screening: Age 79  She will have pap and breast exam soon with gyn, [facility]   Skin cancer screening: Check your skin regularly for new changes, growing lesions, or other lesions of concern Come in for evaluation if you have skin lesions of concern.   Lung cancer screening: If you have a greater than 20 pack year history of tobacco use, then you may qualify for lung cancer screening with a chest CT scan.   Please call your insurance company to inquire about coverage for this test.   Pancreatic cancer:  no current screening test is available or routinely recommended. (risk factors: smoking, overweight or obese, diabetes, chronic pancreatitis, work exposure - dry cleaning, metal working, 38yo>, M>F, Tree surgeon, family hx/o, hereditary breast, ovarian, melanoma, lynch, peutz-jeghers).  Symptoms: jaundice, dark urine, light color or greasy stools, itchy skin, belly or back pain, weight loss, poor appetite, nausea, vomiting, liver enlargement, DVT/blood clots.   We currently don't have screenings for other cancers besides breast, cervical, colon, and lung cancers.  If you have a strong family history of cancer or have other cancer screening concerns, please let me know.  Genetic testing referral is an option for individuals with high cancer risk in the family.  There are some other cancer screenings in development  currently.   Bone health: Get at least 150 minutes of aerobic exercise weekly Get weight bearing exercise at least once weekly Bone density test:  A bone density test is an imaging test that uses a type of X-ray to measure the amount of calcium and other minerals in your bones. The test may be used to diagnose or screen you for a condition that causes weak or thin bones (osteoporosis), predict your risk for a broken bone (fracture), or determine how well your osteoporosis treatment is working. The bone density test is recommended for females 65 and older, or females or males <65 if certain risk factors such as  thyroid  disease, long term use of steroids such as for asthma or rheumatological issues, vitamin D deficiency, estrogen deficiency, family history of osteoporosis, self or family history of fragility fracture in first degree relative.    Heart health: Get at least 150 minutes of aerobic exercise weekly Limit alcohol It is important to maintain a healthy blood pressure and healthy cholesterol numbers  Heart disease screening: Screening for heart disease includes screening for blood pressure, fasting lipids, glucose/diabetes screening, BMI height to weight ratio, reviewed of smoking status, physical activity, and diet.    Goals include blood pressure 120/80 or less, maintaining a healthy lipid/cholesterol profile, preventing diabetes or keeping diabetes numbers under good control, not smoking or using tobacco products, exercising most days per week or at least 150 minutes per week of exercise, and eating healthy variety of fruits and vegetables, healthy oils, and avoiding unhealthy food choices like fried food, fast food, high sugar and high cholesterol foods.    Other tests may possibly include EKG test, CT coronary calcium score, echocardiogram, exercise treadmill stress test.      Vascular disease screening: For higher risk individuals including smokers, diabetics, patients with  known heart disease or high blood pressure, kidney disease, and others, screening for vascular disease or atherosclerosis of the arteries is available.  Examples may include carotid ultrasound, abdominal aortic ultrasound, ABI blood flow screening in the legs, thoracic aorta screening.   Medical care options: I recommend you continue to seek care here first for routine care.  We try really hard to have available appointments Monday through Friday daytime hours for sick visits, acute visits, and physicals.  Urgent care should be used for after hours and weekends for significant issues that cannot wait till the next day.  The emergency department should be used for significant potentially life-threatening emergencies.  The emergency department is expensive, can often have long wait times for less significant concerns, so try to utilize primary care, urgent care, or telemedicine when possible to avoid unnecessary trips to the emergency department.  Virtual visits and telemedicine have been introduced since the pandemic started in 2020, and can be convenient ways to receive medical care.  We offer virtual appointments as well to assist you in a variety of options to seek medical care.   Legal  Take the time to do a last will and testament, Advanced Directives including Health Care Power of Attorney and Living Will documents.  Don't leave your family with burdens that can be handled ahead of time.   Advanced Directives: I recommend you consider completing a Health Care Power of Attorney and Living Will.   These documents respect your wishes and help alleviate burdens on your loved ones if you were to become terminally ill or be in a position to need those documents enforced.    You can complete Advanced Directives yourself, have them notarized, then have copies made for our office, for you and for anybody you feel should have them in safe keeping.  Or, you can have an attorney prepare these documents.    If you haven't updated your Last Will and Testament in a while, it may be worthwhile having an attorney prepare these documents together and save on some costs.       Mary Gibbs was seen today for annual exam.  Diagnoses and all orders for this visit:  Encounter for health maintenance examination in adult -     CBC -     Comprehensive metabolic panel with GFR -  Lipid panel -     TSH -     Hemoglobin A1c -     Urinalysis, Routine w reflex microscopic -     Sedimentation rate -     ANA -     High sensitivity CRP  Encounter for lipid screening for cardiovascular disease -     TSH  Screening for hematuria or proteinuria -     Urinalysis, Routine w reflex microscopic  History of iritis -     Sedimentation rate -     ANA -     High sensitivity CRP  Screening for condition -     Sedimentation rate -     ANA -     High sensitivity CRP  BMI 36.0-36.9,adult     I recommend follow up yearly for a routine physical.   [facility]

## 2024-03-10 ENCOUNTER — Ambulatory Visit: Payer: Self-pay | Admitting: Medical

## 2024-03-10 LAB — URINALYSIS, ROUTINE W REFLEX MICROSCOPIC
Bilirubin, UA: NEGATIVE
Glucose, UA: NEGATIVE
Ketones, UA: NEGATIVE
Leukocytes,UA: NEGATIVE
Nitrite, UA: NEGATIVE
Protein,UA: NEGATIVE
Specific Gravity, UA: 1.024 (ref 1.005–1.030)
Urobilinogen, Ur: 0.2 mg/dL (ref 0.2–1.0)
pH, UA: 5.5 (ref 5.0–7.5)

## 2024-03-10 LAB — COMPREHENSIVE METABOLIC PANEL WITH GFR
ALT: 11 IU/L (ref 0–32)
AST: 16 IU/L (ref 0–40)
Albumin: 4 g/dL (ref 3.9–4.9)
Alkaline Phosphatase: 43 IU/L (ref 41–116)
BUN/Creatinine Ratio: 18 (ref 9–23)
BUN: 14 mg/dL (ref 6–20)
Bilirubin Total: 0.4 mg/dL (ref 0.0–1.2)
CO2: 21 mmol/L (ref 20–29)
Calcium: 9.2 mg/dL (ref 8.7–10.2)
Chloride: 103 mmol/L (ref 96–106)
Creatinine, Ser: 0.78 mg/dL (ref 0.57–1.00)
Globulin, Total: 3 g/dL (ref 1.5–4.5)
Glucose: 89 mg/dL (ref 70–99)
Potassium: 4.4 mmol/L (ref 3.5–5.2)
Sodium: 138 mmol/L (ref 134–144)
Total Protein: 7 g/dL (ref 6.0–8.5)
eGFR: 99 mL/min/1.73 (ref >59)

## 2024-03-10 LAB — LIPID PANEL
Chol/HDL Ratio: 3.8 ratio (ref 0.0–4.4)
Cholesterol, Total: 188 mg/dL (ref 100–199)
HDL: 50 mg/dL (ref >39)
LDL Chol Calc (NIH): 130 mg/dL — ABNORMAL HIGH (ref 0–99)
Triglycerides: 40 mg/dL (ref 0–149)
VLDL Cholesterol Cal: 8 mg/dL (ref 5–40)

## 2024-03-10 LAB — CBC
Hematocrit: 36.6 % (ref 34.0–46.6)
Hemoglobin: 11.7 g/dL (ref 11.1–15.9)
MCH: 31 pg (ref 26.6–33.0)
MCHC: 32 g/dL (ref 31.5–35.7)
MCV: 97 fL (ref 79–97)
Platelets: 348 x10E3/uL (ref 150–450)
RBC: 3.78 x10E6/uL (ref 3.77–5.28)
RDW: 12.1 % (ref 11.7–15.4)
WBC: 7 x10E3/uL (ref 3.4–10.8)

## 2024-03-10 LAB — HIGH SENSITIVITY CRP: CRP, High Sensitivity: 3.83 mg/L — AB (ref 0.00–3.00)

## 2024-03-10 LAB — TSH: TSH: 0.797 u[IU]/mL (ref 0.450–4.500)

## 2024-03-10 LAB — HEMOGLOBIN A1C
Est. average glucose Bld gHb Est-mCnc: 100 mg/dL
Hgb A1c MFr Bld: 5.1 % (ref 4.8–5.6)

## 2024-03-10 LAB — ANA

## 2024-03-10 LAB — MICROSCOPIC EXAMINATION
Epithelial Cells (non renal): >10 /HPF — AB (ref 0–10)
Renal Epithel, UA: NONE SEEN /LPF

## 2024-03-10 LAB — SEDIMENTATION RATE: Sed Rate: 10 mm/h (ref 0–32)

## 2024-03-10 NOTE — Progress Notes (Signed)
 Results through MyChart

## 2024-03-13 NOTE — Progress Notes (Signed)
 Results through MyChart

## 2024-03-25 ENCOUNTER — Other Ambulatory Visit (HOSPITAL_COMMUNITY): Payer: Self-pay

## 2024-04-14 DIAGNOSIS — Z01419 Encounter for gynecological examination (general) (routine) without abnormal findings: Secondary | ICD-10-CM | POA: Diagnosis not present

## 2024-04-14 DIAGNOSIS — Z118 Encounter for screening for other infectious and parasitic diseases: Secondary | ICD-10-CM | POA: Diagnosis not present

## 2024-04-14 DIAGNOSIS — Z124 Encounter for screening for malignant neoplasm of cervix: Secondary | ICD-10-CM | POA: Diagnosis not present

## 2024-04-14 DIAGNOSIS — Z1331 Encounter for screening for depression: Secondary | ICD-10-CM | POA: Diagnosis not present

## 2024-04-21 ENCOUNTER — Telehealth: Payer: Self-pay | Admitting: Internal Medicine

## 2024-04-21 NOTE — Telephone Encounter (Signed)
 Copied from CRM #[id number]. Topic: Clinical - Medical Advice >> Apr 21, 2024 12:09 PM Gustabo D wrote: Pt wants to know if her pcp can call in a special shampoo for Secorrheice -  Can be reached on mychart

## 2024-04-22 NOTE — Telephone Encounter (Signed)
 Pt was notified.

## 2024-05-19 ENCOUNTER — Other Ambulatory Visit (HOSPITAL_COMMUNITY): Payer: Self-pay

## 2024-05-19 ENCOUNTER — Telehealth: Payer: Self-pay | Admitting: Medical

## 2024-05-19 ENCOUNTER — Other Ambulatory Visit: Payer: Self-pay | Admitting: Medical

## 2024-05-19 ENCOUNTER — Telehealth (HOSPITAL_COMMUNITY): Payer: Self-pay

## 2024-05-19 MED ORDER — ZEPBOUND 15 MG/0.5ML ~~LOC~~ SOAJ
15.0000 mg | SUBCUTANEOUS | 1 refills | Status: DC
  Filled 2024-05-19: qty 2, 28d supply, fill #0

## 2024-05-19 NOTE — Telephone Encounter (Signed)
 Good Afternoon, I am on the Prior Auth team and some [employer] plans will cover Zepboud despite the indication so I am going to attempt the prior auth, I will let you know when I receive a determination.

## 2024-05-19 NOTE — Telephone Encounter (Signed)
 Copied from CRM [id number]. Topic: Clinical - Prescription Issue >> May 19, 2024 12:40 PM Kevelyn M wrote: Reason for CRM: Patient calling in because the pharmacy stated that her insurance is no longer covering zepbound . She needs to know her options. Please advise.  Call back # [phone]

## 2024-05-19 NOTE — Telephone Encounter (Signed)
 PA request has been Received. New Encounter has been or will be created for follow up. For additional info see Pharmacy Prior Auth telephone encounter from 05/19/24.

## 2024-05-19 NOTE — Telephone Encounter (Signed)
 Pharmacy Patient Advocate Encounter   Received notification from Pt Calls Messages that prior authorization for Zepbound  15MG /0.5ML pen-injectors  is required/requested.   Insurance verification completed.   The patient is insured through [facility].   Per test claim: PA required; PA submitted to above mentioned insurance via Latent Key/confirmation #/EOC [facility] Status is pending

## 2024-05-20 NOTE — Telephone Encounter (Signed)
 Pharmacy Patient Advocate Encounter  Received notification from [facility] that Prior Authorization for  Zepbound  15MG /0.5ML pen-injectors  has been DENIED.  See denial reason below. No denial letter attached in CMM. Will attach denial letter to Media tab once received.   PA #/Case ID/Reference #: [id number]

## 2024-05-23 NOTE — Telephone Encounter (Signed)
 Pt advised that she has [employer] and wants to make sure the updated one is being used for PA. See media with the updated insurance as of 05/23/2024   Copied from CRM #[id number]. Topic: Clinical - Prescription Issue >> May 19, 2024 12:40 PM Kevelyn M wrote: Reason for CRM: Patient calling in because the pharmacy stated that her insurance is no longer covering zepbound . She needs to know her options. Please advise.  Call back # [phone] >> May 23, 2024  2:41 PM Amy B wrote: 2nd attempt:  Patient called again stating she has not received a call back from anyone regarding her issue with her GLP-1.

## 2024-05-31 ENCOUNTER — Other Ambulatory Visit (HOSPITAL_COMMUNITY): Payer: Self-pay

## 2024-05-31 NOTE — Telephone Encounter (Unsigned)
 Copied from CRM [id number]. Topic: General - Billing Inquiry >> May 31, 2024  1:38 PM Yolanda T wrote: Reason for CRM: patient called to make sure the correct insurance was processed for the PA for tirzepatide  (ZEPBOUND ) 15 MG/0.5ML Pen. A copy of her new insurance card was redcvd and uploaded to media. Please f/u with patient with status.

## 2024-06-13 NOTE — Telephone Encounter (Signed)
 Pt scheduled for tomorrow.

## 2024-06-13 NOTE — Telephone Encounter (Signed)
 Pt coming in tomorrow to discuss maleria pills and we can discuss this too

## 2024-06-14 ENCOUNTER — Other Ambulatory Visit (HOSPITAL_COMMUNITY): Payer: Self-pay

## 2024-06-14 ENCOUNTER — Telehealth (HOSPITAL_COMMUNITY): Payer: Self-pay

## 2024-06-14 ENCOUNTER — Telehealth (HOSPITAL_COMMUNITY): Payer: Self-pay | Admitting: Pharmacy Technician

## 2024-06-14 ENCOUNTER — Encounter: Payer: Self-pay | Admitting: Medical

## 2024-06-14 ENCOUNTER — Other Ambulatory Visit: Payer: Self-pay

## 2024-06-14 ENCOUNTER — Ambulatory Visit: Admitting: Medical

## 2024-06-14 VITALS — BP 112/64 | HR 72 | Ht 61.0 in | Wt 202.2 lb

## 2024-06-14 DIAGNOSIS — Z6838 Body mass index (BMI) 38.0-38.9, adult: Secondary | ICD-10-CM | POA: Diagnosis not present

## 2024-06-14 DIAGNOSIS — F419 Anxiety disorder, unspecified: Secondary | ICD-10-CM

## 2024-06-14 DIAGNOSIS — Z7184 Encounter for health counseling related to travel: Secondary | ICD-10-CM | POA: Diagnosis not present

## 2024-06-14 MED ORDER — DIAZEPAM 5 MG PO TABS
5.0000 mg | ORAL_TABLET | Freq: Two times a day (BID) | ORAL | 0 refills | Status: AC | PRN
  Filled 2024-06-14: qty 12, 6d supply, fill #0

## 2024-06-14 MED ORDER — ZEPBOUND 15 MG/0.5ML ~~LOC~~ SOAJ
15.0000 mg | SUBCUTANEOUS | 1 refills | Status: AC
  Filled 2024-06-14 – 2024-06-15 (×2): qty 2, 28d supply, fill #0

## 2024-06-14 MED ORDER — ATOVAQUONE-PROGUANIL HCL 250-100 MG PO TABS
1.0000 | ORAL_TABLET | Freq: Every day | ORAL | 0 refills | Status: AC
  Filled 2024-06-14: qty 30, 30d supply, fill #0

## 2024-06-14 NOTE — Telephone Encounter (Signed)
 Pharmacy Patient Advocate Encounter   Received notification from Pt Calls Messages/Pharmacy that prior authorization for Zepbound  15mg  is required/requested.   Insurance verification completed.   The patient is insured through [facility].   Per test claim: Per test claim, medication is not covered due to plan/benefit exclusion, PA not submitted at this time

## 2024-06-14 NOTE — Progress Notes (Signed)
 "  Name: Mary Gibbs   Date of Visit: 06/14/2024   Date of last visit with me: 05/19/2024   CHIEF COMPLAINT:  Chief Complaint  Patient presents with   Travel Consult    Patient is travelling to [place] 06/19/24 for 28 days.       HPI:  Discussed the use of AI scribe software for clinical note transcription with the patient, who gave verbal consent to proceed.  History of Present Illness  Mary Gibbs is a 39 year old female who presents for malaria prophylaxis and flight anxiety management before traveling to [place].  She is planning a trip to [place], [place], and requires malaria prophylaxis. She has not taken malaria prophylaxis before and is not allergic to any medications. She will be in [place] for 28 days.  She experiences significant anxiety related to flying, describing symptoms of tremor during flights. She has previously used Ativan  for flight anxiety but is considering trying diazepam  for longer flights. She has not used diazepam  before.  She has been on a weight loss medication, Zepbound , at the highest dose of 15 mg, which was effective. However, her insurance has stopped covering it. She previously tried Wegovy  but experienced severe nausea and eructation with each dose, making it intolerable.  No medication allergies.  ROS as in subjective  Past Medical History:  Diagnosis Date   Anemia    @ last [facility] appt   H/O hiatal hernia    History of depression    Hx of ulcer disease    Hx of varicella    Infection    UTI   NSVD (normal spontaneous vaginal delivery) 08/26/2012    Medications Ordered Prior to Encounter[1]    OBJECTIVE:    BP 112/64   Pulse 72   Ht 5' 1 (1.549 m)   Wt 202 lb 3.2 oz (91.7 kg)   BMI 38.21 kg/m   BP Readings from Last 3 Encounters:  06/14/24 112/64  03/09/24 120/68  12/01/23 110/72    Wt Readings from Last 3 Encounters:  06/14/24 202 lb 3.2 oz (91.7 kg)  03/09/24 198 lb 3.2 oz (89.9 kg)  12/01/23 210 lb 12.8 oz  (95.6 kg)    General appearence: alert, no distress, WD/WN,  Psych: pleasant, good eye contact, answers questions appropriately    ASSESSMENT/PLAN:   Encounter Diagnoses  Name Primary?   Travel advice encounter Yes   Anxiety with flying    BMI 38.0-38.9,adult     Malaria prophylaxis for travel to [place] Travel to [place] requires malaria prophylaxis.  - Prescribed Atovaquone  -Proguanil daily starting 1-2 days before travel, continuing for one week after return.  Flight anxiety Experiences significant flight anxiety. Diazepam  suggested as a longer-acting alternative to Ativan  for a 12-hour flight. Discussed sedation risk and need to check [place]'s medication importation regulations. - Prescribed diazepam  5 mg, 1/2- 1 tablets, as needed, for flight anxiety. - Advised to check [place]'s regulations regarding medication importation.  Obesity Previously effective Zepbound  no longer covered by insurance. Discussed alternative medications and drug study participation. Wegovy  not tolerated due to nausea.  - Reapplied for insurance coverage for Zepbound , noting prior Wegovy  intolerance. - Discussed potential participation in a weight loss drug study with [employer]. - Advised to contact insurance regarding coverage for Wegovy  pill    Mary Gibbs was seen today for travel consult.  Diagnoses and all orders for this visit:  Travel advice encounter  Anxiety with flying  BMI 38.0-38.9,adult  Other orders -  atovaquone -proguanil (MALARONE ) 250-100 MG TABS tablet; Take 1 tablet by mouth daily. Begin 2 days prior to trip, use daily, continue for 7 days after trip -     diazepam  (VALIUM ) 5 MG tablet; Take 1 tablet (5 mg total) by mouth every 12 (twelve) hours as needed for anxiety. -     tirzepatide  (ZEPBOUND ) 15 MG/0.5ML Pen; Inject 15 mg into the skin once a week.  F/u 1-2 months  [facility]    [1]  Current Outpatient Medications on File Prior  to Visit  Medication Sig Dispense Refill   acetaminophen  (TYLENOL ) 500 MG tablet Take 1 tablet (500 mg total) by mouth every 6 (six) hours as needed (pain). 30 tablet 0   BIOTIN PO Take 1 capsule by mouth daily.     BLACK CURRANT SEED OIL PO Take 2 capsules by mouth daily. Green tea supplement     ibuprofen  (ADVIL ) 600 MG tablet Take 1 tablet (600 mg total) by mouth every 6 (six) hours as needed. 30 tablet 0   ondansetron  (ZOFRAN -ODT) 4 MG disintegrating tablet Take 1 tablet (4 mg total) by mouth every 8 (eight) hours as needed for nausea or vomiting. 30 tablet 0   polyethylene glycol powder (GLYCOLAX /MIRALAX ) 17 GM/SCOOP powder Take 17 g by mouth daily. Drink 17g (1 scoop) dissolved in water per day. 255 g 0   Probiotic Product (ALIGN) 4 MG CAPS Take 1 capsule (4 mg total) by mouth daily. 30 capsule 2   No current facility-administered medications on file prior to visit.   "

## 2024-06-14 NOTE — Patient Instructions (Addendum)
 Malaria prophylaxis for travel to [place] Travel to [place] requires malaria prophylaxis.  - Prescribed Atovaquone  -Proguanil daily starting 1-2 days before travel, continuing for one week after return.  Flight anxiety Experiences significant flight anxiety. Diazepam  suggested as a longer-acting alternative to Ativan  for a 12-hour flight. Discussed sedation risk and need to check [place]'s medication importation regulations. - Prescribed diazepam  5 mg, 1/2- 1 tablets, as needed, for flight anxiety. - Advised to check [place]'s regulations regarding medication importation.  Obesity Previously effective Zepbound  no longer covered by insurance. Discussed alternative medications and drug study participation. Wegovy  not tolerated due to nausea.  - Reapplied for insurance coverage for Zepbound , noting prior Wegovy  intolerance. - Discussed potential participation in a weight loss drug study with [employer]. - Advised to contact insurance regarding coverage for Wegovy  pill

## 2024-06-14 NOTE — Telephone Encounter (Signed)
 PA request has been Denied. New Encounter has been or will be created for follow up. For additional info see Pharmacy Prior Auth telephone encounter from 06/14/24.

## 2024-06-15 ENCOUNTER — Other Ambulatory Visit (HOSPITAL_COMMUNITY): Payer: Self-pay

## 2024-07-03 ENCOUNTER — Encounter: Payer: Self-pay | Admitting: Internal Medicine

## 2024-07-04 ENCOUNTER — Telehealth: Admitting: Medical

## 2024-07-04 NOTE — Progress Notes (Signed)
 "  Canceled appointment  "

## 2024-07-05 ENCOUNTER — Ambulatory Visit: Payer: Self-pay | Admitting: Medical
